# Patient Record
Sex: Female | Born: 1950 | Race: White | Hispanic: No | Marital: Married | Smoking: Never smoker
Health system: Southern US, Community
[De-identification: ages and names within clinical notes are randomized; demographics above are authoritative.]

## PROBLEM LIST (undated history)

## (undated) DIAGNOSIS — M6281 Muscle weakness (generalized): Secondary | ICD-10-CM

## (undated) DIAGNOSIS — Z940 Kidney transplant status: Secondary | ICD-10-CM

## (undated) DIAGNOSIS — R262 Difficulty in walking, not elsewhere classified: Secondary | ICD-10-CM

## (undated) DIAGNOSIS — R1312 Dysphagia, oropharyngeal phase: Secondary | ICD-10-CM

## (undated) DIAGNOSIS — N189 Chronic kidney disease, unspecified: Secondary | ICD-10-CM

## (undated) DIAGNOSIS — N390 Urinary tract infection, site not specified: Secondary | ICD-10-CM

---

## 2007-09-07 ENCOUNTER — Encounter: Admission: RE | Admit: 2007-09-07 | Discharge: 2007-09-07 | Payer: Self-pay | Admitting: Unknown Physician Specialty

## 2008-09-10 ENCOUNTER — Encounter: Admission: RE | Admit: 2008-09-10 | Discharge: 2008-09-10 | Payer: Self-pay | Admitting: Family Medicine

## 2009-09-16 ENCOUNTER — Encounter: Admission: RE | Admit: 2009-09-16 | Discharge: 2009-09-16 | Payer: Self-pay | Admitting: Internal Medicine

## 2010-09-23 ENCOUNTER — Encounter: Admission: RE | Admit: 2010-09-23 | Discharge: 2010-09-23 | Payer: Self-pay | Admitting: Family Medicine

## 2011-08-14 ENCOUNTER — Other Ambulatory Visit: Payer: Self-pay | Admitting: *Deleted

## 2011-08-14 DIAGNOSIS — Z1231 Encounter for screening mammogram for malignant neoplasm of breast: Secondary | ICD-10-CM

## 2011-09-29 ENCOUNTER — Inpatient Hospital Stay: Admission: RE | Admit: 2011-09-29 | Payer: Self-pay | Source: Ambulatory Visit

## 2011-10-20 ENCOUNTER — Ambulatory Visit
Admission: RE | Admit: 2011-10-20 | Discharge: 2011-10-20 | Disposition: A | Discharge status: Home / Self Care | Payer: Self-pay | Source: Ambulatory Visit | Attending: *Deleted | Admitting: *Deleted

## 2011-10-20 DIAGNOSIS — Z1231 Encounter for screening mammogram for malignant neoplasm of breast: Secondary | ICD-10-CM

## 2014-01-03 ENCOUNTER — Ambulatory Visit: Payer: Medicare Other

## 2014-01-03 ENCOUNTER — Ambulatory Visit (INDEPENDENT_AMBULATORY_CARE_PROVIDER_SITE_OTHER): Payer: BC Managed Care – PPO | Admitting: Physical Therapy

## 2014-01-03 DIAGNOSIS — M6281 Muscle weakness (generalized): Secondary | ICD-10-CM

## 2014-01-03 DIAGNOSIS — M25569 Pain in unspecified knee: Secondary | ICD-10-CM

## 2014-01-03 DIAGNOSIS — R269 Unspecified abnormalities of gait and mobility: Secondary | ICD-10-CM

## 2014-01-03 DIAGNOSIS — R609 Edema, unspecified: Secondary | ICD-10-CM

## 2014-01-08 ENCOUNTER — Ambulatory Visit: Payer: Medicare Other | Admitting: Physical Therapy

## 2014-01-08 ENCOUNTER — Encounter (INDEPENDENT_AMBULATORY_CARE_PROVIDER_SITE_OTHER): Payer: BC Managed Care – PPO | Admitting: Physical Therapy

## 2014-01-08 DIAGNOSIS — M6281 Muscle weakness (generalized): Secondary | ICD-10-CM

## 2014-01-08 DIAGNOSIS — R609 Edema, unspecified: Secondary | ICD-10-CM

## 2014-01-08 DIAGNOSIS — R269 Unspecified abnormalities of gait and mobility: Secondary | ICD-10-CM

## 2014-01-08 DIAGNOSIS — M25569 Pain in unspecified knee: Secondary | ICD-10-CM

## 2014-01-12 ENCOUNTER — Encounter (INDEPENDENT_AMBULATORY_CARE_PROVIDER_SITE_OTHER): Payer: BC Managed Care – PPO | Admitting: Physical Therapy

## 2014-01-12 DIAGNOSIS — R609 Edema, unspecified: Secondary | ICD-10-CM

## 2014-01-12 DIAGNOSIS — M25569 Pain in unspecified knee: Secondary | ICD-10-CM

## 2014-01-12 DIAGNOSIS — M6281 Muscle weakness (generalized): Secondary | ICD-10-CM

## 2014-01-12 DIAGNOSIS — R269 Unspecified abnormalities of gait and mobility: Secondary | ICD-10-CM

## 2014-01-15 ENCOUNTER — Encounter (INDEPENDENT_AMBULATORY_CARE_PROVIDER_SITE_OTHER): Payer: BC Managed Care – PPO | Admitting: Physical Therapy

## 2014-01-15 DIAGNOSIS — R269 Unspecified abnormalities of gait and mobility: Secondary | ICD-10-CM

## 2014-01-15 DIAGNOSIS — M25569 Pain in unspecified knee: Secondary | ICD-10-CM

## 2014-01-15 DIAGNOSIS — R609 Edema, unspecified: Secondary | ICD-10-CM

## 2014-01-15 DIAGNOSIS — M6281 Muscle weakness (generalized): Secondary | ICD-10-CM

## 2014-01-18 ENCOUNTER — Encounter (INDEPENDENT_AMBULATORY_CARE_PROVIDER_SITE_OTHER): Payer: BC Managed Care – PPO

## 2014-01-18 DIAGNOSIS — R269 Unspecified abnormalities of gait and mobility: Secondary | ICD-10-CM

## 2014-01-18 DIAGNOSIS — M25569 Pain in unspecified knee: Secondary | ICD-10-CM

## 2014-01-18 DIAGNOSIS — M6281 Muscle weakness (generalized): Secondary | ICD-10-CM

## 2014-01-18 DIAGNOSIS — R609 Edema, unspecified: Secondary | ICD-10-CM

## 2014-01-22 ENCOUNTER — Encounter (INDEPENDENT_AMBULATORY_CARE_PROVIDER_SITE_OTHER): Payer: BC Managed Care – PPO | Admitting: Physical Therapy

## 2014-01-22 DIAGNOSIS — M6281 Muscle weakness (generalized): Secondary | ICD-10-CM

## 2014-01-22 DIAGNOSIS — M25569 Pain in unspecified knee: Secondary | ICD-10-CM

## 2014-01-22 DIAGNOSIS — R609 Edema, unspecified: Secondary | ICD-10-CM

## 2014-01-22 DIAGNOSIS — R269 Unspecified abnormalities of gait and mobility: Secondary | ICD-10-CM

## 2014-01-25 ENCOUNTER — Encounter (INDEPENDENT_AMBULATORY_CARE_PROVIDER_SITE_OTHER): Payer: BC Managed Care – PPO

## 2014-01-25 DIAGNOSIS — M25569 Pain in unspecified knee: Secondary | ICD-10-CM

## 2014-01-25 DIAGNOSIS — M6281 Muscle weakness (generalized): Secondary | ICD-10-CM

## 2014-01-25 DIAGNOSIS — R609 Edema, unspecified: Secondary | ICD-10-CM

## 2014-01-25 DIAGNOSIS — R269 Unspecified abnormalities of gait and mobility: Secondary | ICD-10-CM

## 2017-11-16 DIAGNOSIS — E039 Hypothyroidism, unspecified: Secondary | ICD-10-CM

## 2017-11-16 DIAGNOSIS — F319 Bipolar disorder, unspecified: Secondary | ICD-10-CM

## 2017-11-16 DIAGNOSIS — F419 Anxiety disorder, unspecified: Secondary | ICD-10-CM

## 2017-11-16 DIAGNOSIS — A0472 Enterocolitis due to Clostridium difficile, not specified as recurrent: Secondary | ICD-10-CM

## 2017-11-16 HISTORY — DX: Hypothyroidism, unspecified: E03.9

## 2017-11-16 HISTORY — DX: Enterocolitis due to Clostridium difficile, not specified as recurrent: A04.72

## 2017-11-16 HISTORY — DX: Bipolar disorder, unspecified: F31.9

## 2017-11-16 HISTORY — DX: Anxiety disorder, unspecified: F41.9

## 2019-11-27 ENCOUNTER — Emergency Department (EMERGENCY_DEPARTMENT_HOSPITAL)
Admission: EM | Admit: 2019-11-27 | Discharge: 2019-11-28 | Disposition: A | Discharge status: Home / Self Care | Payer: Medicare Other | Source: Home / Self Care | Attending: Emergency Medicine | Admitting: Emergency Medicine

## 2019-11-27 ENCOUNTER — Encounter (HOSPITAL_COMMUNITY): Payer: Self-pay

## 2019-11-27 ENCOUNTER — Other Ambulatory Visit: Payer: Self-pay

## 2019-11-27 DIAGNOSIS — F319 Bipolar disorder, unspecified: Secondary | ICD-10-CM | POA: Diagnosis not present

## 2019-11-27 DIAGNOSIS — Z20822 Contact with and (suspected) exposure to covid-19: Secondary | ICD-10-CM | POA: Insufficient documentation

## 2019-11-27 DIAGNOSIS — N181 Chronic kidney disease, stage 1: Secondary | ICD-10-CM | POA: Insufficient documentation

## 2019-11-27 DIAGNOSIS — R4182 Altered mental status, unspecified: Secondary | ICD-10-CM

## 2019-11-27 DIAGNOSIS — Z8659 Personal history of other mental and behavioral disorders: Secondary | ICD-10-CM

## 2019-11-27 DIAGNOSIS — E039 Hypothyroidism, unspecified: Secondary | ICD-10-CM | POA: Insufficient documentation

## 2019-11-27 DIAGNOSIS — Z940 Kidney transplant status: Secondary | ICD-10-CM | POA: Insufficient documentation

## 2019-11-27 DIAGNOSIS — N390 Urinary tract infection, site not specified: Secondary | ICD-10-CM | POA: Diagnosis not present

## 2019-11-27 HISTORY — DX: Dysphagia, oropharyngeal phase: R13.12

## 2019-11-27 HISTORY — DX: Kidney transplant status: Z94.0

## 2019-11-27 HISTORY — DX: Chronic kidney disease, unspecified: N18.9

## 2019-11-27 HISTORY — DX: Muscle weakness (generalized): M62.81

## 2019-11-27 HISTORY — DX: Urinary tract infection, site not specified: N39.0

## 2019-11-27 HISTORY — DX: Difficulty in walking, not elsewhere classified: R26.2

## 2019-11-27 LAB — COMPREHENSIVE METABOLIC PANEL
ALT: 10 U/L (ref 0–44)
AST: 19 U/L (ref 15–41)
Albumin: 4 g/dL (ref 3.5–5.0)
Alkaline Phosphatase: 26 U/L — ABNORMAL LOW (ref 38–126)
Anion gap: 10 (ref 5–15)
BUN: 24 mg/dL — ABNORMAL HIGH (ref 8–23)
CO2: 22 mmol/L (ref 22–32)
Calcium: 9.7 mg/dL (ref 8.9–10.3)
Chloride: 109 mmol/L (ref 98–111)
Creatinine, Ser: 1.39 mg/dL — ABNORMAL HIGH (ref 0.44–1.00)
GFR calc Af Amer: 45 mL/min — ABNORMAL LOW (ref >60)
GFR calc non Af Amer: 39 mL/min — ABNORMAL LOW (ref >60)
Glucose, Bld: 103 mg/dL — ABNORMAL HIGH (ref 70–99)
Potassium: 3.9 mmol/L (ref 3.5–5.1)
Sodium: 141 mmol/L (ref 135–145)
Total Bilirubin: 0.6 mg/dL (ref 0.3–1.2)
Total Protein: 7 g/dL (ref 6.5–8.1)

## 2019-11-27 LAB — CBC
HCT: 42.2 % (ref 36.0–46.0)
Hemoglobin: 14 g/dL (ref 12.0–15.0)
MCH: 30.6 pg (ref 26.0–34.0)
MCHC: 33.2 g/dL (ref 30.0–36.0)
MCV: 92.1 fL (ref 80.0–100.0)
Platelets: 227 10*3/uL (ref 150–400)
RBC: 4.58 MIL/uL (ref 3.87–5.11)
RDW: 12.2 % (ref 11.5–15.5)
WBC: 7.6 10*3/uL (ref 4.0–10.5)
nRBC: 0 % (ref 0.0–0.2)

## 2019-11-27 LAB — URINALYSIS, ROUTINE W REFLEX MICROSCOPIC
Bilirubin Urine: NEGATIVE
Glucose, UA: NEGATIVE mg/dL
Hgb urine dipstick: NEGATIVE
Ketones, ur: 5 mg/dL — AB
Leukocytes,Ua: NEGATIVE
Nitrite: NEGATIVE
Protein, ur: NEGATIVE mg/dL
Specific Gravity, Urine: 1.015 (ref 1.005–1.030)
pH: 7 (ref 5.0–8.0)

## 2019-11-27 LAB — RESPIRATORY PANEL BY RT PCR (FLU A&B, COVID)
Influenza A by PCR: NEGATIVE
Influenza B by PCR: NEGATIVE
SARS Coronavirus 2 by RT PCR: NEGATIVE

## 2019-11-27 LAB — VALPROIC ACID LEVEL: Valproic Acid Lvl: 45 ug/mL — ABNORMAL LOW (ref 50.0–100.0)

## 2019-11-27 LAB — LACTIC ACID, PLASMA: Lactic Acid, Venous: 1.2 mmol/L (ref 0.5–1.9)

## 2019-11-27 NOTE — ED Provider Notes (Addendum)
[facility] DEPT Provider Note   CSN: [id number] Arrival date & time: 11/27/19  1603     History Chief Complaint  Patient presents with  . Altered Mental Status    Brenda Nolan is a 69 y.o. female.  Patient with hx bipolar, ckd s/p renal transplant 2011, presents via EMS from [facility] with general confusion more so than baseline. Also report is history of uti. Patient is very limited historian, ?altered ms/psych - level 5 caveat. Patient just states she 'needs more medicine for bipolar', but denies any other specific physical complaint or symptoms. No fever. No headache. No neck pain or stiffness. No chest pain or sob. No cough or uri symptoms. No abd pain or nvd. No dysuria or gu c/o. Pt unsure if any recent change in meds.   The history is provided by the patient and the EMS personnel. The history is limited by the condition of the patient.  Altered Mental Status Presenting symptoms: confusion   Associated symptoms: no abdominal pain, no fever, no headaches, no rash and no vomiting        Past Medical History:  Diagnosis Date  . Anxiety 2019  . Bipolar 1 disorder ([place]) 2019  . Chronic kidney disease (CKD)    Stage 1  . Difficulty in walking, not elsewhere classified   . Dysphagia, oropharyngeal phase   . Enterocolitis due to Clostridioides difficile 2019  . Hypothyroidism 2019  . Kidney transplant status   . Muscle weakness   . Urinary tract infection     There are no problems to display for this patient.   No past surgical history on file.   OB History   No obstetric history on file.     No family history on file.  Social History   Tobacco Use  . Smoking status: Not on file  Substance Use Topics  . Alcohol use: Not on file  . Drug use: Not on file    Home Medications Prior to Admission medications   Not on File    Allergies    Abilify [aripiprazole], Ibuprofen, Levaquin [levofloxacin], Sulfa antibiotics, and  Sulfamethoxazole  Review of Systems   Review of Systems  Constitutional: Negative for chills and fever.  HENT: Negative for sore throat.   Eyes: Negative for redness.  Respiratory: Negative for cough and shortness of breath.   Cardiovascular: Negative for chest pain.  Gastrointestinal: Negative for abdominal pain, diarrhea and vomiting.  Endocrine: Negative for polyuria.  Genitourinary: Negative for dysuria and flank pain.  Musculoskeletal: Negative for back pain and neck pain.  Skin: Negative for rash.  Neurological: Negative for headaches.  Hematological: Does not bruise/bleed easily.  Psychiatric/Behavioral: Positive for confusion.    Physical Exam Updated Vital Signs BP (!) 147/88   Pulse (!) 121   Temp 99.3 F (37.4 C) (Oral)   Resp (!) 28   SpO2 100%   Physical Exam Vitals and nursing note reviewed.  Constitutional:      Appearance: Normal appearance. She is well-developed.  HENT:     Head: Atraumatic.     Nose: Nose normal.     Mouth/Throat:     Mouth: Mucous membranes are moist.  Eyes:     General: No scleral icterus.    Conjunctiva/sclera: Conjunctivae normal.     Pupils: Pupils are equal, round, and reactive to light.  Neck:     Vascular: No carotid bruit.     Trachea: No tracheal deviation.     Comments: No stiffness  or rigidity Cardiovascular:     Rate and Rhythm: Normal rate and regular rhythm.     Pulses: Normal pulses.     Heart sounds: Normal heart sounds. No murmur. No friction rub. No gallop.   Pulmonary:     Effort: Pulmonary effort is normal. No respiratory distress.     Breath sounds: Normal breath sounds.  Abdominal:     General: Bowel sounds are normal. There is no distension.     Palpations: Abdomen is soft.     Tenderness: There is no abdominal tenderness. There is no guarding.  Genitourinary:    Comments: No cva tenderness.  Musculoskeletal:        General: No swelling or tenderness.     Cervical back: Normal range of motion and  neck supple. No rigidity. No muscular tenderness.     Right lower leg: No edema.     Left lower leg: No edema.  Skin:    General: Skin is warm and dry.     Findings: No rash.  Neurological:     Mental Status: She is alert.     Comments: Alert, speech pressured. Moves bil extremities purposefully with good strength. sens grossly intact bil.  Psychiatric:     Comments: Anxious appearing.      ED Results / Procedures / Treatments   Labs (all labs ordered are listed, but only abnormal results are displayed) Results for orders placed or performed during the hospital encounter of 11/27/19  Respiratory Panel by RT PCR (Flu A&B, Covid) - Nasopharyngeal Swab   Specimen: Nasopharyngeal Swab  Result Value Ref Range   SARS Coronavirus 2 by RT PCR NEGATIVE NEGATIVE   Influenza A by PCR NEGATIVE NEGATIVE   Influenza B by PCR NEGATIVE NEGATIVE  CBC  Result Value Ref Range   WBC 7.6 4.0 - 10.5 K/uL   RBC 4.58 3.87 - 5.11 MIL/uL   Hemoglobin 14.0 12.0 - 15.0 g/dL   HCT 42.2 36.0 - 46.0 %   MCV 92.1 80.0 - 100.0 fL   MCH 30.6 26.0 - 34.0 pg   MCHC 33.2 30.0 - 36.0 g/dL   RDW 12.2 11.5 - 15.5 %   Platelets 227 150 - 400 K/uL   nRBC 0.0 0.0 - 0.2 %  CMET  Result Value Ref Range   Sodium 141 135 - 145 mmol/L   Potassium 3.9 3.5 - 5.1 mmol/L   Chloride 109 98 - 111 mmol/L   CO2 22 22 - 32 mmol/L   Glucose, Bld 103 (H) 70 - 99 mg/dL   BUN 24 (H) 8 - 23 mg/dL   Creatinine, Ser 1.39 (H) 0.44 - 1.00 mg/dL   Calcium 9.7 8.9 - 10.3 mg/dL   Total Protein 7.0 6.5 - 8.1 g/dL   Albumin 4.0 3.5 - 5.0 g/dL   AST 19 15 - 41 U/L   ALT 10 0 - 44 U/L   Alkaline Phosphatase 26 (L) 38 - 126 U/L   Total Bilirubin 0.6 0.3 - 1.2 mg/dL   GFR calc non Af Amer 39 (L) >60 mL/min   GFR calc Af Amer 45 (L) >60 mL/min   Anion gap 10 5 - 15  UA  Result Value Ref Range   Color, Urine YELLOW YELLOW   APPearance CLOUDY (A) CLEAR   Specific Gravity, Urine 1.015 1.005 - 1.030   pH 7.0 5.0 - 8.0   Glucose, UA  NEGATIVE NEGATIVE mg/dL   Hgb urine dipstick NEGATIVE NEGATIVE   Bilirubin Urine NEGATIVE  NEGATIVE   Ketones, ur 5 (A) NEGATIVE mg/dL   Protein, ur NEGATIVE NEGATIVE mg/dL   Nitrite NEGATIVE NEGATIVE   Leukocytes,Ua NEGATIVE NEGATIVE  Lactic acid  Result Value Ref Range   Lactic Acid, Venous 1.2 0.5 - 1.9 mmol/L  Valproic acid level  Result Value Ref Range   Valproic Acid Lvl 45 (L) 50.0 - 100.0 ug/mL     EKG None  Radiology CT HEAD WO CONTRAST  Result Date: 11/28/2019 CLINICAL DATA:  Altered mental status EXAM: CT HEAD WITHOUT CONTRAST TECHNIQUE: Contiguous axial images were obtained from the base of the skull through the vertex without intravenous contrast. COMPARISON:  None. FINDINGS: Brain: No evidence of acute territorial infarction, hemorrhage, hydrocephalus,extra-axial collection or mass lesion/mass effect. There is dilatation the ventricles and sulci consistent with age-related atrophy. Low-attenuation changes in the deep white matter consistent with small vessel ischemia. Vascular: No hyperdense vessel or unexpected calcification. Skull: The skull is intact. No fracture or focal lesion identified. Sinuses/Orbits: The visualized paranasal sinuses and mastoid air cells are clear. The orbits and globes intact. Other: None IMPRESSION: No acute intracranial abnormality. Findings consistent with age related atrophy and chronic small vessel ischemia Electronically Signed   By: Prudencio Pair M.D.   On: 11/28/2019 00:29    Procedures Procedures (including critical care time)  Medications Ordered in ED Medications - No data to display  ED Course  I have reviewed the triage vital signs and the nursing notes.  Pertinent labs & imaging results that were available during my care of the patient were reviewed by me and considered in my medical decision making (see chart for details).    MDM Rules/Calculators/A&P                     Iv ns. Continuous pulse ox and monitor. Stat labs.    Reviewed nursing notes and prior charts for additional history.   Labs reviewed/interpreted by me - chem normal. ua neg for infection. Lactate normal.  Patient appears anxious, at times agitated, almost manic, requests 'bipolar' evaluation. [facility] team consulted.   Recheck pt, calmer, alert, content appearing, await BH eval.   CT reviewed/interpreted by me - no acute hem.   [place] team has assessed and indicates recommends overnight observation and they will reassess in AM.         Final Clinical Impression(s) / ED Diagnoses Final diagnoses:  None    Rx / DC Orders ED Discharge Orders    None          Lajean Saver, MD 11/28/19 [phone]

## 2019-11-27 NOTE — BH Assessment (Signed)
Tele Assessment Note   Patient Name: Brenda Nolan MRN: [id number] Referring Physician: Lajean Saver, MD Location of Patient: [facility] Location of Provider: [facility] Department  [name] is an 69 y.o. female. Per EDP report, "Patient with hx bipolar, ckd s/p renal transplant 2011, presents via EMS from [facility] with general confusion more so than baseline. Also report is history of uti. Patient is very limited historian, ?altered ms/psych - level 5 caveat. Patient just states she 'needs more medicine for bipolar', but denies any other specific physical complaint or symptoms. No fever. No headache. No neck pain or stiffness. No chest pain or sob. No cough or uri symptoms. No abd pain or nvd. No dysuria or gu c/o. Pt unsure if any recent change in meds.  The history is provided by the patient and the EMS personnel. The history is limited by the condition of the patient.   TTS:  Pt presents to [facility] via EMS from assisted living facility for altered mental status. TTS attempted to ask pt questions, pt presented very confused with an altered mental status and kept repeating the phrase, "I'm supposed to go in triage". Pt unable to complete assessment and answer more questions. Pt was asked for collateral information, stated she has a daughter but could not say her name or phone number. Pt presented with partial incoherent speech and shaking physically.    Collateral:  TTS called pts assisted living facility [facility] located in [place] at [phone]. TTS spoke with Med Technician Kandis. She states that pt has displayed altered and confused behavior since falling in the shower at the facility last week. She states after fall patient began acting abnormal, confused about how to put her clothing on and her shoes. She states that pt does not have a history of SI attempts or self injurious behaviors while at the home. She also reports pt has a history of Bipolar disorder and  currently takes Depakote, Klonopin as well as other medications and that she is med compliant. She states that pt sleep has been good and her appetite is good as well.  Diagnosis: Altered Mental Status, Bipolar 1 Disorder  Past Medical History:  Past Medical History:  Diagnosis Date  . Anxiety 2019  . Bipolar 1 disorder ([place]) 2019  . Chronic kidney disease (CKD)    Stage 1  . Difficulty in walking, not elsewhere classified   . Dysphagia, oropharyngeal phase   . Enterocolitis due to Clostridioides difficile 2019  . Hypothyroidism 2019  . Kidney transplant status   . Muscle weakness   . Urinary tract infection     No past surgical history on file.  Family History: No family history on file.  Social History:  has no history on file for tobacco, alcohol, and drug.  Additional Social History:  Alcohol / Drug Use Pain Medications: SEE MAR Prescriptions: SEE MAR Over the Counter: SEE MAR History of alcohol / drug use?: No history of alcohol / drug abuse  CIWA: CIWA-Ar BP: (!) 160/92 Pulse Rate: 70 COWS:    Allergies:  Allergies  Allergen Reactions  . Atorvastatin Other (See Comments)    Myalgias Myalgias   . Ketoconazole Rash  . Abilify [Aripiprazole]   . Ibuprofen   . Levaquin [Levofloxacin]   . Sulfa Antibiotics   . Sulfamethoxazole     Home Medications: (Not in a hospital admission)   OB/GYN Status:  No LMP recorded.  General Assessment Data Assessment unable to be completed: Yes Reason for  not completing assessment: PT NOT ROOMED YET Is this a Tele or Face-to-Face Assessment?: Tele Assessment Is this an Initial Assessment or a Re-assessment for this encounter?: Initial Assessment Patient Accompanied by:: N/A Language Other than English: No Living Arrangements: In Assisted Living/Nursing Home (Comment: Name of Nursing Home What gender do you identify as?: Female Pregnancy Status: No Living Arrangements: Other (Comment) Can pt return to current living  arrangement?: Yes Admission Status: Voluntary Is patient capable of signing voluntary admission?: Yes Referral Source: Other Insurance type: none     Crisis Care Plan Living Arrangements: Other (Comment) Legal Guardian: (unknown) Name of Psychiatrist: (unknown, but pt has one) Name of Therapist: none  Education Status Is patient currently in school?: No  Risk to self with the past 6 months Suicidal Ideation: (UTA) Has patient been a risk to self within the past 6 months prior to admission? : (UTA) Suicidal Intent: (UTA) Has patient had any suicidal intent within the past 6 months prior to admission? : (UTA) Is patient at risk for suicide?: (UTA) Suicidal Plan?: (UTA) Has patient had any suicidal plan within the past 6 months prior to admission? : (UTA) Access to Means: (UTA) What has been your use of drugs/alcohol within the last 12 months?: (UTA) Previous Attempts/Gestures: (UTA) How many times?: (UTA) Other Self Harm Risks: (UTA) Triggers for Past Attempts: Unknown Intentional Self Injurious Behavior: (UTA) Family Suicide History: Unable to assess Recent stressful life event(s): (UTA) Persecutory voices/beliefs?: (UTA) Depression: (UTA) Depression Symptoms: (UTA) Substance abuse history and/or treatment for substance abuse?: (UTA) Suicide prevention information given to non-admitted patients: Not applicable  Risk to Others within the past 6 months Homicidal Ideation: (UTA) Does patient have any lifetime risk of violence toward others beyond the six months prior to admission? : Unknown Thoughts of Harm to Others: (UTA) Current Homicidal Intent: (UTA) Current Homicidal Plan: (UTA) Access to Homicidal Means: (UTA) Identified Victim: (UTA) History of harm to others?: (UTA) Assessment of Violence: (UTA) Violent Behavior Description: (UTA) Does patient have access to weapons?: (UTA) Criminal Charges Pending?: (UTA) Does patient have a court date: (UTA) Is patient on  probation?: (UTA)  Psychosis Hallucinations: (UTA) Delusions: (UTA)  Mental Status Report Appearance/Hygiene: Unremarkable Eye Contact: Unable to Assess Motor Activity: Unable to assess Speech: Unable to assess Level of Consciousness: Alert Mood: (UTA) Affect: Unable to Assess Anxiety Level: (UTA) Thought Processes: Unable to Assess Judgement: Unable to Assess Obsessive Compulsive Thoughts/Behaviors: Unable to Assess  Cognitive Functioning Concentration: Unable to Assess Memory: Unable to Assess Is patient IDD: (UTA) Insight: Unable to Assess Impulse Control: Unable to Assess Appetite: (UTA) Have you had any weight changes? : (UTA) Sleep: Unable to Assess Vegetative Symptoms: Unable to Assess  ADLScreening [facility] Assessment Services) Patient's cognitive ability adequate to safely complete daily activities?: (UTA) Patient able to express need for assistance with ADLs?: (UTA) Independently performs ADLs?: (UTA)  Prior Inpatient Therapy Prior Inpatient Therapy: (UTA)  Prior Outpatient Therapy Prior Outpatient Therapy: (UTA)  ADL Screening (condition at time of admission) Patient's cognitive ability adequate to safely complete daily activities?: (UTA) Patient able to express need for assistance with ADLs?: (UTA) Independently performs ADLs?: (UTA)             Advance Directives (For Healthcare) Does Patient Have a Medical Advance Directive?: Yes Type of Advance Directive: Healthcare Power of Attorney          Disposition: Lindon Romp, FNP, recommends overnight observation, reassess in the am. Need more collateral information for pt. TTS confirmed status  with current provider. Disposition Initial Assessment Completed for this Encounter: Yes  This service was provided via telemedicine using a 2-way, interactive audio and video technology.  Names of all persons participating in this telemedicine service and their role in this encounter. Name: Kaylena Petsche  Role: Patient  Name: Antony Contras Role: TTS  Name:  Role:   Name: Role:     Donato Heinz 11/27/2019 11:20 PM

## 2019-11-27 NOTE — ED Triage Notes (Signed)
Pt BIB [facility] from [facility] [address] - per EMS this is a COVID free facility - facility noticed increased AMS, pt appears more confused than baseline. Per facility, pt has frequent UTI after Kidney transplant. Pt denies pain with urination, states, her urine is darker than normal.

## 2019-11-27 NOTE — ED Notes (Signed)
Pt had a fall last week causing bruising around right eye, pt was evaluated at facility.

## 2019-11-27 NOTE — ED Notes (Signed)
Pt moved to TCU for TTS consult.

## 2019-11-27 NOTE — BHH Counselor (Signed)
TTS will see pt when room, per her nurse , she is trying to find her a room to complete assessment now. Thanks - clock stopped

## 2019-11-28 ENCOUNTER — Other Ambulatory Visit (HOSPITAL_COMMUNITY): Payer: Medicare Other

## 2019-11-28 ENCOUNTER — Other Ambulatory Visit: Payer: Self-pay

## 2019-11-28 ENCOUNTER — Encounter (HOSPITAL_COMMUNITY): Payer: Self-pay | Admitting: Registered Nurse

## 2019-11-28 ENCOUNTER — Emergency Department (HOSPITAL_COMMUNITY): Payer: Medicare Other

## 2019-11-28 DIAGNOSIS — F319 Bipolar disorder, unspecified: Secondary | ICD-10-CM

## 2019-11-28 MED ORDER — PERPHENAZINE 4 MG PO TABS
8.0000 mg | ORAL_TABLET | Freq: Every day | ORAL | Status: DC
  Filled 2019-11-28: qty 2

## 2019-11-28 MED ORDER — CYCLOSPORINE 25 MG PO CAPS
150.0000 mg | ORAL_CAPSULE | Freq: Every day | ORAL | Status: DC
  Filled 2019-11-28: qty 2

## 2019-11-28 MED ORDER — MEGESTROL ACETATE 20 MG PO TABS
20.0000 mg | ORAL_TABLET | Freq: Every day | ORAL | Status: DC
  Administered 2019-11-28: 12:00:00 20 mg via ORAL
  Filled 2019-11-28 (×2): qty 1

## 2019-11-28 MED ORDER — AMLODIPINE BESYLATE 5 MG PO TABS
5.0000 mg | ORAL_TABLET | Freq: Every day | ORAL | Status: DC
  Administered 2019-11-28: 5 mg via ORAL
  Filled 2019-11-28: qty 1

## 2019-11-28 MED ORDER — METOPROLOL TARTRATE 25 MG PO TABS
100.0000 mg | ORAL_TABLET | Freq: Two times a day (BID) | ORAL | Status: DC
  Administered 2019-11-28 (×2): 100 mg via ORAL
  Filled 2019-11-28 (×2): qty 4

## 2019-11-28 MED ORDER — DIVALPROEX SODIUM ER 250 MG PO TB24
250.0000 mg | ORAL_TABLET | Freq: Every day | ORAL | Status: DC
  Administered 2019-11-28: 250 mg via ORAL
  Filled 2019-11-28: qty 1

## 2019-11-28 MED ORDER — MEGESTROL ACETATE 40 MG/ML PO SUSP
20.0000 mg | Freq: Every day | ORAL | Status: DC
  Filled 2019-11-28: qty 5

## 2019-11-28 MED ORDER — MAGNESIUM 400 MG PO TABS: 1.0000 | ORAL_TABLET | Freq: Every day | ORAL | Status: DC

## 2019-11-28 MED ORDER — DOXAZOSIN MESYLATE 1 MG PO TABS
1.0000 mg | ORAL_TABLET | Freq: Every day | ORAL | Status: DC
  Filled 2019-11-28: qty 1

## 2019-11-28 MED ORDER — CLONAZEPAM 1 MG PO TABS
1.0000 mg | ORAL_TABLET | Freq: Every day | ORAL | Status: DC
  Administered 2019-11-28: 1 mg via ORAL
  Filled 2019-11-28: qty 2

## 2019-11-28 MED ORDER — MAGNESIUM OXIDE 400 (241.3 MG) MG PO TABS
400.0000 mg | ORAL_TABLET | Freq: Every day | ORAL | Status: DC
  Administered 2019-11-28: 11:00:00 400 mg via ORAL
  Filled 2019-11-28: qty 1

## 2019-11-28 MED ORDER — MYCOPHENOLATE MOFETIL 250 MG PO CAPS
250.0000 mg | ORAL_CAPSULE | Freq: Two times a day (BID) | ORAL | Status: DC
  Administered 2019-11-28: 12:00:00 250 mg via ORAL
  Filled 2019-11-28 (×3): qty 1

## 2019-11-28 MED ORDER — ADULT MULTIVITAMIN W/MINERALS CH
1.0000 | ORAL_TABLET | Freq: Every day | ORAL | Status: DC
  Administered 2019-11-28: 11:00:00 1 via ORAL
  Filled 2019-11-28: qty 1

## 2019-11-28 MED ORDER — LEVOTHYROXINE SODIUM 50 MCG PO TABS
50.0000 ug | ORAL_TABLET | Freq: Every day | ORAL | Status: DC
  Administered 2019-11-28: 07:00:00 50 ug via ORAL
  Filled 2019-11-28: qty 1

## 2019-11-28 MED ORDER — FENOFIBRATE 160 MG PO TABS
160.0000 mg | ORAL_TABLET | Freq: Every day | ORAL | Status: DC
  Administered 2019-11-28: 12:00:00 160 mg via ORAL
  Filled 2019-11-28 (×2): qty 1

## 2019-11-28 MED ORDER — CYCLOSPORINE 25 MG PO CAPS
125.0000 mg | ORAL_CAPSULE | Freq: Every day | ORAL | Status: DC
  Administered 2019-11-28: 125 mg via ORAL
  Filled 2019-11-28 (×3): qty 1

## 2019-11-28 NOTE — ED Notes (Signed)
DCd off unit to facility per provider. Pt alert, calm, cooperative, no s/s of distress. Pt transported by [facility].

## 2019-11-28 NOTE — Progress Notes (Signed)
CSW spoke with [place] at [facility] to inform her that patient had completed her CT scan and evaluated by psych and was cleared on both. [place] reports patient can be transported back via [place]. [place] also asked if CSW could call patient's son to provide him with an update. CSW spoke with patient's son Remo Lipps and let him know patient was being discharged back to [facility].   [place], LCSW Transitions of Care Department [facility] ED [phone]

## 2019-11-28 NOTE — Progress Notes (Signed)
Received Murna from the main ED, she was changed into scrubs and oriented to her new environment.

## 2019-11-28 NOTE — Discharge Instructions (Addendum)
For your behavioral health needs, you are advised to continue treatment with your current outpatient provider. °

## 2019-11-28 NOTE — ED Notes (Signed)
Pt chart reopened to access relative phone number to belongings pickup.

## 2019-11-28 NOTE — BH Assessment (Signed)
[facility] Assessment Progress Note  Per Shuvon Rankin FNP, this pt does not require psychiatric hospitalization at this time.  Pt is to be discharged from [facility] with recommendation to continue treatment with her unspecified outpatient provider.  This has been included in pt's discharge instructions.  A social work consult will be ordered to facilitate pt's return to her residential facility.  Pt's nurse, Eustaquio Maize, has been notified.  Jalene Mullet, [place] Triage Specialist [phone]

## 2019-11-28 NOTE — Consult Note (Signed)
[facility] ED Discharge  11/28/2019 11:39 AM Ora Spittler  MRN:  [id number] Principal Problem: Bipolar 1 disorder [facility]) Discharge Diagnoses: Principal Problem:   Bipolar 1 disorder [facility])   Subjective: Brenda Nolan, 69 y.o., female patient seen via tele psych by this provider, Dr. Dwyane Dee; and chart reviewed on 11/28/19.  On evaluation Masen Windell states that she came to the hospital because of her bursitis and that she is having pain in her legs.  Patient was able to respond with simple questions that were not too lengthy.  When asked if her sleep was good or bad patient responded "bad" when asked why patient responded "hard to fall asleep."  Patient asked if her appetite was good or bad and she responded "good."  Patient denied that she was hearing or seeing things that others could not, patient's stated she did not want to kill herself or hurt or kill anyone else.  Is aware that she is in the hospital but not which hospital.  Patient reported that she had to have a walker to assist with ambulation.  Patient also reported that she needed assistance with getting dressed and bathing.  Patient appears to have some tardive dyskinesia related to the smacking of lips and protrusion of tongue on and off. During evaluation Bibian Musich is alert/oriented x 3; calm/cooperative; and mood is congruent with affect.  She does not appear to be responding to internal/external stimuli or delusional thoughts.  Patient denies suicidal/self-harm/homicidal ideation, psychosis, and paranoia.  Patient answered question appropriately to the best of her ability.  Referral to social work to assist with patient getting back to nursing facility   Total Time spent with patient: 30 minutes  Past Psychiatric History: Bipolar disorder  Past Medical History:  Past Medical History:  Diagnosis Date  . Anxiety 2019  . Bipolar 1 disorder ([place]) 2019  . Chronic kidney disease (CKD)    Stage 1  . Difficulty in walking, not elsewhere  classified   . Dysphagia, oropharyngeal phase   . Enterocolitis due to Clostridioides difficile 2019  . Hypothyroidism 2019  . Kidney transplant status   . Muscle weakness   . Urinary tract infection    History reviewed. No pertinent surgical history. Family History: History reviewed. No pertinent family history. Family Psychiatric  History: Unaware Social History:  Social History   Substance and Sexual Activity  Alcohol Use None     Social History   Substance and Sexual Activity  Drug Use Not on file    Social History   Socioeconomic History  . Marital status: Married    Spouse name: Not on file  . Number of children: Not on file  . Years of education: Not on file  . Highest education level: Not on file  Occupational History  . Not on file  Tobacco Use  . Smoking status: Not on file  Substance and Sexual Activity  . Alcohol use: Not on file  . Drug use: Not on file  . Sexual activity: Not on file  Other Topics Concern  . Not on file  Social History Narrative  . Not on file   Social Determinants of Health   Financial Resource Strain:   . Difficulty of Paying Living Expenses: Not on file  Food Insecurity:   . Worried About Charity fundraiser in the Last Year: Not on file  . Ran Out of Food in the Last Year: Not on file  Transportation Needs:   . Lack of Transportation (Medical): Not  on file  . Lack of Transportation (Non-Medical): Not on file  Physical Activity:   . Days of Exercise per Week: Not on file  . Minutes of Exercise per Session: Not on file  Stress:   . Feeling of Stress : Not on file  Social Connections:   . Frequency of Communication with Friends and Family: Not on file  . Frequency of Social Gatherings with Friends and Family: Not on file  . Attends Religious Services: Not on file  . Active Member of Clubs or Organizations: Not on file  . Attends Archivist Meetings: Not on file  . Marital Status: Not on file    Has this  patient used any form of tobacco in the last 30 days? (Cigarettes, Smokeless Tobacco, Cigars, and/or Pipes) Prescription not provided because: Patient does not use tobacco products  Current Medications: Current Facility-Administered Medications  Medication Dose Route Frequency Provider Last Rate Last Admin  . amLODipine (NORVASC) tablet 5 mg  5 mg Oral Daily Lajean Saver, MD   5 mg at 11/28/19 1055  . clonazePAM (KLONOPIN) tablet 1 mg  1 mg Oral QHS Lajean Saver, MD   1 mg at 11/28/19 0028  . cycloSPORINE (SANDIMMUNE) capsule 125 mg  125 mg Oral Daily Lajean Saver, MD      . cycloSPORINE (SANDIMMUNE) capsule 150 mg  150 mg Oral QHS Lajean Saver, MD      . divalproex (DEPAKOTE ER) 24 hr tablet 250 mg  250 mg Oral QHS Lajean Saver, MD   250 mg at 11/28/19 0028  . doxazosin (CARDURA) tablet 1 mg  1 mg Oral QHS Lajean Saver, MD      . fenofibrate tablet 160 mg  160 mg Oral Daily Lajean Saver, MD      . levothyroxine (SYNTHROID) tablet 50 mcg  50 mcg Oral Q0600 Lajean Saver, MD   50 mcg at 11/28/19 [phone]  . magnesium oxide (MAG-OX) tablet 400 mg  400 mg Oral Daily Lajean Saver, MD   400 mg at 11/28/19 1055  . megestrol (MEGACE) tablet 20 mg  20 mg Oral Daily Lajean Saver, MD      . metoprolol tartrate (LOPRESSOR) tablet 100 mg  100 mg Oral BID Lajean Saver, MD   100 mg at 11/28/19 1054  . multivitamin with minerals tablet 1 tablet  1 tablet Oral Daily Lajean Saver, MD   1 tablet at 11/28/19 1055  . mycophenolate (CELLCEPT) capsule 250 mg  250 mg Oral BID Lajean Saver, MD      . perphenazine (TRILAFON) tablet 8 mg  8 mg Oral QHS Lajean Saver, MD       Current Outpatient Medications  Medication Sig Dispense Refill  . amLODipine (NORVASC) 5 MG tablet Take 5 mg by mouth daily.    . clonazePAM (KLONOPIN) 1 MG tablet Take 1 mg by mouth at bedtime.    . cycloSPORINE (SANDIMMUNE) 25 MG capsule Take 125 mg by mouth daily.    . cycloSPORINE (SANDIMMUNE) 25 MG capsule Take 150 mg by mouth at  bedtime.    . divalproex (DEPAKOTE ER) 250 MG 24 hr tablet Take 250 mg by mouth at bedtime.    Marland Kitchen doxazosin (CARDURA) 1 MG tablet Take 1 mg by mouth at bedtime.    . fenofibrate 160 MG tablet Take 160 mg by mouth daily.    . Iron-FA-B Cmp-C-Biot-Probiotic (FUSION PLUS PO) Take 1 capsule by mouth daily.    Marland Kitchen levothyroxine (SYNTHROID) 50 MCG tablet Take 50 mcg  by mouth daily before breakfast.    . Magnesium 400 MG TABS Take 1 tablet by mouth daily.    . megestrol (MEGACE) 40 MG/ML suspension Take 20 mg by mouth daily.    . metoprolol tartrate (LOPRESSOR) 100 MG tablet Take 100 mg by mouth 2 (two) times daily.    . Multiple Vitamin (MULTIVITAMIN) tablet Take 1 tablet by mouth daily.    . mycophenolate (CELLCEPT) 250 MG capsule Take 250 mg by mouth 2 (two) times daily.    Marland Kitchen perphenazine (TRILAFON) 4 MG tablet Take 8 mg by mouth at bedtime.     PTA Medications: (Not in a hospital admission)   Musculoskeletal: Strength & Muscle Tone: Patient sitting up in bed unable to determine muscle strength Gait & Station: Patient reported needing a walker to assist with ambulation.  Did not see patient ambulate Patient leans: N/A  Psychiatric Specialty Exam: Physical Exam Vitals and nursing note reviewed.  Pulmonary:     Effort: Pulmonary effort is normal.  Neurological:     Mental Status: She is alert.  Psychiatric:        Attention and Perception: Attention normal. She does not perceive auditory or visual hallucinations.        Mood and Affect: Mood is anxious.        Speech: Speech is delayed ( Related to patient taking her time to answer the question or not answering the question if it was to wordY).        Behavior: Behavior is slowed. Behavior is cooperative.        Thought Content: Thought content is not paranoid or delusional. Thought content does not include homicidal or suicidal ideation.     Review of Systems  Musculoskeletal: Positive for arthralgias.  Psychiatric/Behavioral:  Positive for confusion ( Patient denied confusion but will get confused with questioning at times). Agitation:  Denies. Hallucinations:  Denies. Self-injury:  Denies. Sleep disturbance:  Hard to fall asleep. Suicidal ideas:  Denies. The patient is nervous/anxious.   All other systems reviewed and are negative.   Blood pressure 127/79, pulse 91, temperature 97.9 F (36.6 C), temperature source Oral, resp. rate 17, SpO2 99 %.There is no height or weight on file to calculate BMI.  General Appearance: Casual  Eye Contact:  Good  Speech:  Clear and Coherent and Slow  Volume:  Normal  Mood:  Anxious  Affect:  Congruent  Thought Process:  Coherent, Goal Directed and Linear  Orientation:  Full (Time, Place, and Person)  Thought Content:  WDL  Suicidal Thoughts:  No  Homicidal Thoughts:  No  Memory:  Immediate;   Poor Recent;   Poor  Judgement:  Fair  Insight:  Fair  Psychomotor Activity:  Unable to determine  Concentration:  Concentration: Poor and Attention Span: Poor  Recall:  Poor  Fund of Knowledge:  Fair  Language:  Fair  Akathisia:  No  Handed:  Right  AIMS (if indicated):     Assets:  Chief Executive Officer Social Support  ADL's:  Impaired  Cognition:  Impaired,  Mild  Sleep:        Demographic Factors:  Caucasian  Loss Factors: NA  Historical Factors: NA  Risk Reduction Factors:   Religious beliefs about death, Living with another person, especially a relative and Positive social support  Continued Clinical Symptoms:  Previous Psychiatric Diagnoses and Treatments  Cognitive Features That Contribute To Risk:  Loss of executive function    Suicide Risk:  Minimal: No identifiable suicidal  ideation.  Patients presenting with no risk factors but with morbid ruminations; may be classified as minimal risk based on the severity of the depressive symptoms    Plan Of Care/Follow-up recommendations:  Activity:  As tolerated Diet:  Heart  healthy    Discharge Instructions     For your behavioral health needs, you are advised to continue treatment with your current outpatient provider.    Disposition: Patient psychiatrically cleared No evidence of imminent risk to self or others at present.   Patient does not meet criteria for psychiatric inpatient admission. Supportive therapy provided about ongoing stressors. Discussed crisis plan, support from social network, calling 911, coming to the Emergency Department, and calling Suicide Hotline.  Ragnar Waas, NP 11/28/2019, 11:39 AM

## 2019-11-28 NOTE — ED Notes (Signed)
Water and snacks provided for pt.

## 2019-11-29 ENCOUNTER — Inpatient Hospital Stay (HOSPITAL_COMMUNITY)
Admission: EM | Admit: 2019-11-29 | Discharge: 2019-12-06 | DRG: 689 | Disposition: A | Discharge status: Skilled Nursing Facility | Payer: Medicare Other | Attending: Family Medicine | Admitting: Family Medicine

## 2019-11-29 ENCOUNTER — Emergency Department (HOSPITAL_COMMUNITY): Payer: Medicare Other

## 2019-11-29 DIAGNOSIS — F319 Bipolar disorder, unspecified: Secondary | ICD-10-CM | POA: Diagnosis present

## 2019-11-29 DIAGNOSIS — G92 Toxic encephalopathy: Secondary | ICD-10-CM

## 2019-11-29 DIAGNOSIS — E039 Hypothyroidism, unspecified: Secondary | ICD-10-CM | POA: Diagnosis present

## 2019-11-29 DIAGNOSIS — Z87440 Personal history of urinary (tract) infections: Secondary | ICD-10-CM

## 2019-11-29 DIAGNOSIS — G928 Other toxic encephalopathy: Secondary | ICD-10-CM

## 2019-11-29 DIAGNOSIS — I129 Hypertensive chronic kidney disease with stage 1 through stage 4 chronic kidney disease, or unspecified chronic kidney disease: Secondary | ICD-10-CM | POA: Diagnosis present

## 2019-11-29 DIAGNOSIS — N390 Urinary tract infection, site not specified: Principal | ICD-10-CM | POA: Diagnosis present

## 2019-11-29 DIAGNOSIS — R4182 Altered mental status, unspecified: Secondary | ICD-10-CM | POA: Diagnosis present

## 2019-11-29 DIAGNOSIS — N3000 Acute cystitis without hematuria: Secondary | ICD-10-CM

## 2019-11-29 DIAGNOSIS — N1832 Chronic kidney disease, stage 3b: Secondary | ICD-10-CM | POA: Diagnosis present

## 2019-11-29 DIAGNOSIS — R410 Disorientation, unspecified: Secondary | ICD-10-CM

## 2019-11-29 DIAGNOSIS — G9341 Metabolic encephalopathy: Secondary | ICD-10-CM | POA: Diagnosis present

## 2019-11-29 DIAGNOSIS — Z940 Kidney transplant status: Secondary | ICD-10-CM

## 2019-11-29 DIAGNOSIS — I10 Essential (primary) hypertension: Secondary | ICD-10-CM | POA: Diagnosis present

## 2019-11-29 DIAGNOSIS — Z79899 Other long term (current) drug therapy: Secondary | ICD-10-CM

## 2019-11-29 DIAGNOSIS — Z20822 Contact with and (suspected) exposure to covid-19: Secondary | ICD-10-CM | POA: Diagnosis present

## 2019-11-29 DIAGNOSIS — Z8619 Personal history of other infectious and parasitic diseases: Secondary | ICD-10-CM

## 2019-11-29 DIAGNOSIS — G2401 Drug induced subacute dyskinesia: Secondary | ICD-10-CM | POA: Diagnosis present

## 2019-11-29 DIAGNOSIS — D84821 Immunodeficiency due to drugs: Secondary | ICD-10-CM | POA: Diagnosis present

## 2019-11-29 DIAGNOSIS — R0902 Hypoxemia: Secondary | ICD-10-CM | POA: Diagnosis present

## 2019-11-29 LAB — PROTIME-INR
INR: 1.1 (ref 0.8–1.2)
Prothrombin Time: 13.8 seconds (ref 11.4–15.2)

## 2019-11-29 LAB — CBC WITH DIFFERENTIAL/PLATELET
Abs Immature Granulocytes: 0.06 10*3/uL (ref 0.00–0.07)
Basophils Absolute: 0 10*3/uL (ref 0.0–0.1)
Basophils Relative: 0 %
Eosinophils Absolute: 0.1 10*3/uL (ref 0.0–0.5)
Eosinophils Relative: 1 %
HCT: 41.4 % (ref 36.0–46.0)
Hemoglobin: 14 g/dL (ref 12.0–15.0)
Immature Granulocytes: 1 %
Lymphocytes Relative: 22 %
Lymphs Abs: 2.8 10*3/uL (ref 0.7–4.0)
MCH: 30.8 pg (ref 26.0–34.0)
MCHC: 33.8 g/dL (ref 30.0–36.0)
MCV: 91 fL (ref 80.0–100.0)
Monocytes Absolute: 0.7 10*3/uL (ref 0.1–1.0)
Monocytes Relative: 6 %
Neutro Abs: 9.2 10*3/uL — ABNORMAL HIGH (ref 1.7–7.7)
Neutrophils Relative %: 70 %
Platelets: 220 10*3/uL (ref 150–400)
RBC: 4.55 MIL/uL (ref 3.87–5.11)
RDW: 12.2 % (ref 11.5–15.5)
WBC: 12.8 10*3/uL — ABNORMAL HIGH (ref 4.0–10.5)
nRBC: 0 % (ref 0.0–0.2)

## 2019-11-29 LAB — URINALYSIS, COMPLETE (UACMP) WITH MICROSCOPIC
Bilirubin Urine: NEGATIVE
Glucose, UA: NEGATIVE mg/dL
Ketones, ur: 5 mg/dL — AB
Leukocytes,Ua: NEGATIVE
Nitrite: NEGATIVE
Protein, ur: NEGATIVE mg/dL
Specific Gravity, Urine: 1.015 (ref 1.005–1.030)
pH: 6 (ref 5.0–8.0)

## 2019-11-29 LAB — PROCALCITONIN: Procalcitonin: 1.07 ng/mL

## 2019-11-29 LAB — LACTIC ACID, PLASMA: Lactic Acid, Venous: 1.2 mmol/L (ref 0.5–1.9)

## 2019-11-29 LAB — RAPID URINE DRUG SCREEN, HOSP PERFORMED
Amphetamines: NOT DETECTED
Barbiturates: NOT DETECTED
Benzodiazepines: NOT DETECTED
Cocaine: NOT DETECTED
Opiates: NOT DETECTED
Tetrahydrocannabinol: NOT DETECTED

## 2019-11-29 LAB — COMPREHENSIVE METABOLIC PANEL
ALT: 11 U/L (ref 0–44)
AST: 19 U/L (ref 15–41)
Albumin: 3.7 g/dL (ref 3.5–5.0)
Alkaline Phosphatase: 31 U/L — ABNORMAL LOW (ref 38–126)
Anion gap: 14 (ref 5–15)
BUN: 23 mg/dL (ref 8–23)
CO2: 18 mmol/L — ABNORMAL LOW (ref 22–32)
Calcium: 10.1 mg/dL (ref 8.9–10.3)
Chloride: 104 mmol/L (ref 98–111)
Creatinine, Ser: 1.52 mg/dL — ABNORMAL HIGH (ref 0.44–1.00)
GFR calc Af Amer: 41 mL/min — ABNORMAL LOW (ref >60)
GFR calc non Af Amer: 35 mL/min — ABNORMAL LOW (ref >60)
Glucose, Bld: 91 mg/dL (ref 70–99)
Potassium: 3.9 mmol/L (ref 3.5–5.1)
Sodium: 136 mmol/L (ref 135–145)
Total Bilirubin: 1.3 mg/dL — ABNORMAL HIGH (ref 0.3–1.2)
Total Protein: 7.1 g/dL (ref 6.5–8.1)

## 2019-11-29 LAB — RESPIRATORY PANEL BY RT PCR (FLU A&B, COVID)
Influenza A by PCR: NEGATIVE
Influenza B by PCR: NEGATIVE
SARS Coronavirus 2 by RT PCR: NEGATIVE

## 2019-11-29 LAB — AMMONIA: Ammonia: 18 umol/L (ref 9–35)

## 2019-11-29 LAB — CBG MONITORING, ED: Glucose-Capillary: 77 mg/dL (ref 70–99)

## 2019-11-29 MED ORDER — MAGNESIUM OXIDE 400 (241.3 MG) MG PO TABS
400.0000 mg | ORAL_TABLET | Freq: Every day | ORAL | Status: DC
  Administered 2019-11-30 – 2019-12-06 (×7): 400 mg via ORAL
  Filled 2019-11-29 (×7): qty 1

## 2019-11-29 MED ORDER — DIVALPROEX SODIUM ER 500 MG PO TB24
500.0000 mg | ORAL_TABLET | Freq: Every day | ORAL | Status: DC
  Administered 2019-11-29 – 2019-12-05 (×7): 500 mg via ORAL
  Filled 2019-11-29 (×7): qty 1

## 2019-11-29 MED ORDER — PERPHENAZINE 2 MG PO TABS
8.0000 mg | ORAL_TABLET | Freq: Every day | ORAL | Status: DC
  Administered 2019-11-29 – 2019-12-03 (×5): 8 mg via ORAL
  Filled 2019-11-29 (×3): qty 4
  Filled 2019-11-29: qty 1
  Filled 2019-11-29: qty 4

## 2019-11-29 MED ORDER — MEGESTROL ACETATE 40 MG/ML PO SUSP
20.0000 mg | Freq: Every day | ORAL | Status: DC
  Filled 2019-11-29: qty 5

## 2019-11-29 MED ORDER — MAGNESIUM 400 MG PO TABS: 1.0000 | ORAL_TABLET | Freq: Every day | ORAL | Status: DC

## 2019-11-29 MED ORDER — MYCOPHENOLATE MOFETIL 250 MG PO CAPS
250.0000 mg | ORAL_CAPSULE | Freq: Two times a day (BID) | ORAL | Status: DC
  Administered 2019-11-29 – 2019-12-06 (×14): 250 mg via ORAL
  Filled 2019-11-29 (×15): qty 1

## 2019-11-29 MED ORDER — ACETAMINOPHEN 650 MG RE SUPP: 650.0000 mg | Freq: Four times a day (QID) | RECTAL | Status: DC | PRN

## 2019-11-29 MED ORDER — SODIUM CHLORIDE 0.9 % IV SOLN
1.0000 g | Freq: Once | INTRAVENOUS | Status: AC
  Administered 2019-11-29: 1 g via INTRAVENOUS
  Filled 2019-11-29: qty 10

## 2019-11-29 MED ORDER — CYCLOSPORINE 25 MG PO CAPS
150.0000 mg | ORAL_CAPSULE | Freq: Every day | ORAL | Status: DC
  Administered 2019-11-29 – 2019-12-05 (×7): 150 mg via ORAL
  Filled 2019-11-29 (×9): qty 2

## 2019-11-29 MED ORDER — SODIUM CHLORIDE 0.45 % IV SOLN: INTRAVENOUS | Status: DC

## 2019-11-29 MED ORDER — AMLODIPINE BESYLATE 5 MG PO TABS
5.0000 mg | ORAL_TABLET | Freq: Every day | ORAL | Status: DC
  Administered 2019-11-30 – 2019-12-06 (×7): 5 mg via ORAL
  Filled 2019-11-29 (×7): qty 1

## 2019-11-29 MED ORDER — CLONAZEPAM 1 MG PO TABS
1.0000 mg | ORAL_TABLET | Freq: Every day | ORAL | Status: DC | PRN
  Administered 2019-11-30 – 2019-12-04 (×3): 1 mg via ORAL
  Filled 2019-11-29 (×4): qty 1

## 2019-11-29 MED ORDER — CYCLOSPORINE 25 MG PO CAPS
125.0000 mg | ORAL_CAPSULE | Freq: Every day | ORAL | Status: DC
  Administered 2019-11-30 – 2019-12-06 (×7): 125 mg via ORAL
  Filled 2019-11-29 (×7): qty 1

## 2019-11-29 MED ORDER — FENOFIBRATE 160 MG PO TABS
160.0000 mg | ORAL_TABLET | Freq: Every day | ORAL | Status: DC
  Administered 2019-11-30 – 2019-12-06 (×7): 160 mg via ORAL
  Filled 2019-11-29 (×7): qty 1

## 2019-11-29 MED ORDER — METOPROLOL TARTRATE 50 MG PO TABS
100.0000 mg | ORAL_TABLET | Freq: Two times a day (BID) | ORAL | Status: DC
  Administered 2019-11-29 – 2019-12-06 (×14): 100 mg via ORAL
  Filled 2019-11-29 (×14): qty 2

## 2019-11-29 MED ORDER — THIAMINE HCL 100 MG/ML IJ SOLN
100.0000 mg | Freq: Once | INTRAMUSCULAR | Status: AC
  Administered 2019-11-29: 100 mg via INTRAVENOUS
  Filled 2019-11-29: qty 2

## 2019-11-29 MED ORDER — DOXAZOSIN MESYLATE 1 MG PO TABS
1.0000 mg | ORAL_TABLET | Freq: Every day | ORAL | Status: DC
  Administered 2019-11-29 – 2019-12-05 (×7): 1 mg via ORAL
  Filled 2019-11-29 (×8): qty 1

## 2019-11-29 MED ORDER — LEVOTHYROXINE SODIUM 50 MCG PO TABS
50.0000 ug | ORAL_TABLET | Freq: Every day | ORAL | Status: DC
  Administered 2019-11-30 – 2019-12-06 (×7): 50 ug via ORAL
  Filled 2019-11-29 (×7): qty 1

## 2019-11-29 MED ORDER — ACETAMINOPHEN 325 MG PO TABS: 650.0000 mg | ORAL_TABLET | Freq: Four times a day (QID) | ORAL | Status: DC | PRN

## 2019-11-29 MED ORDER — ONDANSETRON HCL 4 MG/2ML IJ SOLN: 4.0000 mg | Freq: Four times a day (QID) | INTRAMUSCULAR | Status: DC | PRN

## 2019-11-29 MED ORDER — ONDANSETRON HCL 4 MG PO TABS: 4.0000 mg | ORAL_TABLET | Freq: Four times a day (QID) | ORAL | Status: DC | PRN

## 2019-11-29 NOTE — ED Provider Notes (Signed)
[facility] DEPART[facility] Provider Note   CSN: [id number] Arrival date & time: 11/29/19  1551     History No chief complaint on file.   Brenda Nolan is a 69 y.o. female scented from [facility] facility for weakness and worsening mental status.  She was seen 2 days ago for the same complaint.  EMS reports that the patient slid out of her wheelchair today and was let down to the floor by the staff who are attending her.  She had no real injuries today.  The patient was unable to provide much history.  She does have a history of psychiatric disorder, chronic kidney disease status post transplant in 2011.  She is chronically immunocompromised on CellCept and cyclosporine.  The patient had a work-up that included a CT head yesterday that was negative for acute abnormality.Labs done show a low Depakote level just below the normal range, a negative Covid test, lactic acid was within normal limits, urinalysis was without infection, creatinine and BUN were just mildly elevated.  She had no elevated white blood cell count 2 days ago.  Patient arrives to the ER very tachypneic, flushed skin and hot to the touch.  HPI     Past Medical History:  Diagnosis Date  . Anxiety 2019  . Bipolar 1 disorder ([place]) 2019  . Chronic kidney disease (CKD)    Stage 1  . Difficulty in walking, not elsewhere classified   . Dysphagia, oropharyngeal phase   . Enterocolitis due to Clostridioides difficile 2019  . Hypothyroidism 2019  . Kidney transplant status   . Muscle weakness   . Urinary tract infection     Patient Active Problem List   Diagnosis Date Noted  . Altered mental status 11/29/2019  . Kidney transplant status   . Stage 3b chronic kidney disease   . Essential hypertension   . Bipolar 1 disorder ([place]) 2019  . Hypothyroidism 2019    No past surgical history on file.   OB History   No obstetric history on file.     No family history on file.  Social  History   Tobacco Use  . Smoking status: Not on file  Substance Use Topics  . Alcohol use: Not on file  . Drug use: Not on file    Home Medications Prior to Admission medications   Medication Sig Start Date End Date Taking? Authorizing Provider  amLODipine (NORVASC) 5 MG tablet Take 5 mg by mouth daily.   Yes [provider]  clonazePAM (KLONOPIN) 1 MG tablet Take 1 mg by mouth daily as needed (For nervousness).    Yes [provider]  cycloSPORINE (SANDIMMUNE) 25 MG capsule Take 125 mg by mouth daily.   Yes [provider]  cycloSPORINE (SANDIMMUNE) 25 MG capsule Take 150 mg by mouth at bedtime.   Yes [provider]  divalproex (DEPAKOTE ER) 500 MG 24 hr tablet Take 500 mg by mouth at bedtime.   Yes [provider]  doxazosin (CARDURA) 1 MG tablet Take 1 mg by mouth at bedtime.   Yes [provider]  fenofibrate 160 MG tablet Take 160 mg by mouth daily.   Yes [provider]  levothyroxine (SYNTHROID) 50 MCG tablet Take 50 mcg by mouth daily before breakfast.   Yes [provider]  Magnesium 400 MG TABS Take 1 tablet by mouth daily.   Yes [provider]  megestrol (MEGACE) 40 MG/ML suspension Take 20 mg by mouth daily.  Yes [provider]  metoprolol tartrate (LOPRESSOR) 100 MG tablet Take 100 mg by mouth 2 (two) times daily.   Yes [provider]  Multiple Vitamin (MULTIVITAMIN) tablet Take 1 tablet by mouth daily.   Yes [provider]  mycophenolate (CELLCEPT) 250 MG capsule Take 250 mg by mouth 2 (two) times daily.   Yes [provider]  perphenazine (TRILAFON) 4 MG tablet Take 8 mg by mouth at bedtime.   Yes [provider]  Iron-FA-B Cmp-C-Biot-Probiotic (FUSION PLUS PO) Take 1 capsule by mouth daily.    [provider]    Allergies    Atorvastatin, Ketoconazole, Abilify [aripiprazole], Ibuprofen, Levaquin [levofloxacin], Sulfa antibiotics,  and Sulfamethoxazole  Review of Systems   Review of Systems Unable to review his symptoms secondary to altered mental status Physical Exam Updated Vital Signs BP 130/77 (BP Location: Right Arm)   Pulse 86   Temp 98.6 F (37 C) (Oral)   Resp (!) 25   LMP  (LMP Unknown)   SpO2 100%   Physical Exam Vitals and nursing note reviewed.  Constitutional:      General: She is not in acute distress.    Appearance: She is well-developed. She is ill-appearing and toxic-appearing. She is not diaphoretic.  HENT:     Head: Normocephalic and atraumatic.  Eyes:     General: No scleral icterus.    Extraocular Movements: Extraocular movements intact.     Conjunctiva/sclera: Conjunctivae normal.     Pupils: Pupils are equal, round, and reactive to light.  Cardiovascular:     Rate and Rhythm: Normal rate and regular rhythm.     Heart sounds: Normal heart sounds. No murmur. No friction rub. No gallop.   Pulmonary:     Effort: Respiratory distress present.     Breath sounds: Normal breath sounds.     Comments: Tachypneic Abdominal:     General: Bowel sounds are normal. There is no distension.     Palpations: Abdomen is soft. There is no mass.     Tenderness: There is no abdominal tenderness. There is no guarding.  Musculoskeletal:     Cervical back: Normal range of motion.  Skin:    Comments: Skin is flushed, hot to the touch  Neurological:     Mental Status: She is alert.     GCS: GCS eye subscore is 4. GCS verbal subscore is 4. GCS motor subscore is 6.  Psychiatric:        Behavior: Behavior normal.     ED Results / Procedures / Treatments   Labs (all labs ordered are listed, but only abnormal results are displayed) Labs Reviewed  COMPREHENSIVE METABOLIC PANEL - Abnormal; Notable for the following components:      Result Value   CO2 18 (*)    Creatinine, Ser 1.52 (*)    Alkaline Phosphatase 31 (*)    Total Bilirubin 1.3 (*)    GFR calc non Af Amer 35 (*)    GFR calc Af Amer 41  (*)    All other components within normal limits  CBC WITH DIFFERENTIAL/PLATELET - Abnormal; Notable for the following components:   WBC 12.8 (*)    Neutro Abs 9.2 (*)    All other components within normal limits  URINALYSIS, COMPLETE (UACMP) WITH MICROSCOPIC - Abnormal; Notable for the following components:   Color, Urine AMBER (*)    APPearance HAZY (*)    Hgb urine dipstick SMALL (*)    Ketones, ur 5 (*)  Bacteria, UA RARE (*)    All other components within normal limits  RESPIRATORY PANEL BY RT PCR (FLU A&B, COVID)  CULTURE, BLOOD (ROUTINE X 2)  CULTURE, BLOOD (ROUTINE X 2)  URINE CULTURE  AMMONIA  LACTIC ACID, PLASMA  RAPID URINE DRUG SCREEN, HOSP PERFORMED  PROTIME-INR  PROCALCITONIN  HIV ANTIBODY (ROUTINE TESTING W REFLEX)  CBC WITH DIFFERENTIAL/PLATELET  COMPREHENSIVE METABOLIC PANEL  CBG MONITORING, ED    EKG EKG Interpretation  Date/Time:  Wednesday November 29 2019 16:20:43 EST Ventricular Rate:  85 PR Interval:    QRS Duration: 86 QT Interval:  386 QTC Calculation: 459 R Axis:   50 Text Interpretation: Sinus rhythm Consider left atrial enlargement No STEMI Confirmed by Nanda Quinton [phone]) on 11/29/2019 5:17:00 PM   Radiology CT HEAD WO CONTRAST  Result Date: 11/28/2019 CLINICAL DATA:  Altered mental status EXAM: CT HEAD WITHOUT CONTRAST TECHNIQUE: Contiguous axial images were obtained from the base of the skull through the vertex without intravenous contrast. COMPARISON:  None. FINDINGS: Brain: No evidence of acute territorial infarction, hemorrhage, hydrocephalus,extra-axial collection or mass lesion/mass effect. There is dilatation the ventricles and sulci consistent with age-related atrophy. Low-attenuation changes in the deep white matter consistent with small vessel ischemia. Vascular: No hyperdense vessel or unexpected calcification. Skull: The skull is intact. No fracture or focal lesion identified. Sinuses/Orbits: The visualized paranasal sinuses and  mastoid air cells are clear. The orbits and globes intact. Other: None IMPRESSION: No acute intracranial abnormality. Findings consistent with age related atrophy and chronic small vessel ischemia Electronically Signed   By: Prudencio Pair M.D.   On: 11/28/2019 00:29   DG Chest Port 1 View  Result Date: 11/29/2019 CLINICAL DATA:  [place] 1 week ago, decline in function and mental status since EXAM: PORTABLE CHEST 1 VIEW COMPARISON:  Portable exam 1620 hours compared to 01/31/2018 at FINDINGS: Normal heart size, mediastinal contours, and pulmonary vascularity. Lungs clear. No pulmonary infiltrate, pleural effusion or pneumothorax. Bones demineralized. IMPRESSION: No acute abnormalities. Electronically Signed   By: Lavonia Dana M.D.   On: 11/29/2019 16:33    Procedures Procedures (including critical care time)  Medications Ordered in ED Medications  0.45 % sodium chloride infusion ( Intravenous New Bag/Given 11/29/19 2135)  acetaminophen (TYLENOL) tablet 650 mg (has no administration in time range)    Or  acetaminophen (TYLENOL) suppository 650 mg (has no administration in time range)  ondansetron (ZOFRAN) tablet 4 mg (has no administration in time range)    Or  ondansetron (ZOFRAN) injection 4 mg (has no administration in time range)  amLODipine (NORVASC) tablet 5 mg (has no administration in time range)  clonazePAM (KLONOPIN) tablet 1 mg (has no administration in time range)  cycloSPORINE (SANDIMMUNE) capsule 125 mg (has no administration in time range)  cycloSPORINE (SANDIMMUNE) capsule 150 mg (150 mg Oral Given 11/29/19 2152)  divalproex (DEPAKOTE ER) 24 hr tablet 500 mg (500 mg Oral Given 11/29/19 2200)  doxazosin (CARDURA) tablet 1 mg (1 mg Oral Given 11/29/19 2152)  fenofibrate tablet 160 mg (has no administration in time range)  levothyroxine (SYNTHROID) tablet 50 mcg (has no administration in time range)  megestrol (MEGACE) 40 MG/ML suspension 20 mg (has no administration in time range)    metoprolol tartrate (LOPRESSOR) tablet 100 mg (100 mg Oral Given 11/29/19 2200)  mycophenolate (CELLCEPT) capsule 250 mg (250 mg Oral Given 11/29/19 2153)  perphenazine (TRILAFON) tablet 8 mg (has no administration in time range)  magnesium oxide (MAG-OX) tablet 400 mg (has no  administration in time range)  thiamine (B-1) injection 100 mg (100 mg Intravenous Given 11/29/19 1645)  cefTRIAXone (ROCEPHIN) 1 g in sodium chloride 0.9 % 100 mL IVPB (0 g Intravenous Stopped 11/29/19 1959)    ED Course  I have reviewed the triage vital signs and the nursing notes.  Pertinent labs & imaging results that were available during my care of the patient were reviewed by me and considered in my medical decision making (see chart for details).  Clinical Course as of Nov 28 2252  Wed Nov 29, 2019  1752 I spoke with the patient's son Marisa Sprinkles.  He is the healthcare power of attorney.  He states that the patient has had several episodes that are similar to what she is presenting with in the past.  He says that in his previous episodes she has had what he called "weird, fast breathing".  She has had previous urinary tract infections making her weak, altered.  She is also had drug interactions from the antibiotics of her treatment for UTI causing her to have some issues with her psych medications.   [AH]    Clinical Course User Index [AH] Margarita Mail, PA-C   MDM Rules/Calculators/A&P                      CC: Altered mental status and weakness VS:  Vitals:   11/29/19 1815 11/29/19 1845 11/29/19 1900 11/29/19 2030  BP: (!) 144/82 139/79 (!) 148/80 130/77  Pulse: 81 79 77 86  Resp: (!) 22 (!) 29 (!) 26 (!) 25  Temp:    98.6 F (37 C)  TempSrc:    Oral  SpO2: 100% 100% 100% 100%    [id number] is gathered by EMR, EMS and son via telephone. DDX:The differential diagnosis of weakness includes but is not limited to neurologic causes (GBS, myasthenia gravis, CVA, MS, ALS, transverse myelitis, spinal  cord injury, CVA, botulism, ) and other causes: ACS, Arrhythmia, syncope, orthostatic hypotension, sepsis, hypoglycemia, electrolyte disturbance, hypothyroidism, respiratory failure, symptomatic anemia, dehydration, heat injury, polypharmacy, malignancy.The differential diagnosis for AMS is extensive and includes, but is not limited to: drug overdose - opioids, alcohol, sedatives, antipsychotics, drug withdrawal, others; Metabolic: hypoxia, hypoglycemia, hyperglycemia, hypercalcemia, hypernatremia, hyponatremia, uremia, hepatic encephalopathy, hypothyroidism, hyperthyroidism, vitamin B12 or thiamine deficiency, carbon monoxide poisoning, Wilson's disease, Lactic acidosis, DKA/HHOS; Infectious: meningitis, encephalitis, bacteremia/sepsis, urinary tract infection, pneumonia, neurosyphilis; Structural: Space-occupying lesion, (brain tumor, subdural hematoma, hydrocephalus,); Vascular: stroke, subarachnoid hemorrhage, coronary ischemia, hypertensive encephalopathy, CNS vasculitis, thrombotic thrombocytopenic purpura, disseminated intravascular coagulation, hyperviscosity; Psychiatric: Schizophrenia, depression; Other: Seizure, hypothermia, heat stroke, ICU psychosis, dementia -"sundowning."  Labs: I reviewed the labs which show UDS negative.  Lactic acid within normal limits, ammonia level normal.  Covid test is pending.  CMP shows creatinine at baseline.  Patient's CBC does show an elevation in her white blood cell count with left shift which is new from 2 days ago.  Cath urine does appear to be at least potentially infected given that urine was clear 2 days ago and now has some bacteria and red blood cells.  I have treated the patient here with Rocephin.  I have updated the patient's son and healthcare power of attorney. Imaging: I personally reviewed the images (1 view chest x-ray) which show(s) no acute abnormalities on my interpretation EKG: Sinus rhythm at a rate of 85 MDM:.  Patient with weakness and altered  mental status.  Likely metabolic encephalopathy.  I have discussed the case with Dr. Olevia Bowens  who will admit the patient to the hospitalist service.  Patient is stable throughout her ED visit. Patient disposition: Admit Patient condition: Improved. The patient appears reasonably screened and/or stabilized for discharge and I doubt any other medical condition or other [facility] requiring further screening, evaluation, or treatment in the ED at this time prior to discharge. I have discussed lab and/or imaging findings with the patient and answered all questions/concerns to the best of my ability. I have discussed return precautions and OP follow up.     Final Clinical Impression(s) / ED Diagnoses Final diagnoses:  Toxic metabolic encephalopathy  Acute cystitis without hematuria    Rx / DC Orders ED Discharge Orders    None       Margarita Mail, PA-C 11/29/19 2307    Margette Fast, MD 11/30/19 1245
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
## 2019-11-29 NOTE — H&P (Signed)
History and Physical    Brenda Nolan DOB: 03/17/1951 DOA: 11/29/2019  PCP: Patient, No Pcp Per   Patient coming from: Home.  I have personally briefly reviewed patient's old medical records in [place]  Chief Complaint: Altered mental status.  HPI: Brenda Nolan is a 69 y.o. female with medical history significant of anxiety, bipolar 1 disorders, stage IIIb chronic kidney disease, unspecified difficulty walking history, oropharyngeal phase dysphagia, history of C. difficile colitis, hypothyroidism, muscle weakness, kidney transplant status on immunosuppressants, history of UTI episodes associated with AMS who is brought to the emergency department due to decreasing mentation in the past 3 days.  Facility reported a fall with head trauma about a week ago.  She was brought to the ER on (11/27/2019) Monday evening and had a CT scan that was negative.  She was seen by behavioral health who recommended an overnight observation in the ED and she was later discharged yesterday morning.  However, she returns today after sliding out of her wheelchair.  She is oriented to name, she knows she is in the hospital, but disoriented to date and recent events.  She is unable to provide further history.  ED Course: Urinalysis was hazy in appearance, with small hemoglobinuria ketonuria 5 mg/dL.  She had 21-50 RBC and 0-5 WBC per hpf with rare bacteria.  UDS was negative.  Her CBC showed a white count was 12.8 with 70% neutrophils, hemoglobin 14.0 g/dL and platelets 220.  PT was 13.8 and INR 1.1.  Ammonia was 18 mol/L.  Lactic acid was normal.  PCR for SARS 2, influenza a and influenza B were negative.  Procalcitonin was 1.07 ng/mL.    Imaging: Her chest radiograph did not have any acute abnormality.  Recent CT of the head done on 11/28/2019 did not have any acute on normalities.  Review of Systems: As per HPI otherwise 10 point review of systems negative.   Past Medical History:  Diagnosis  Date  . Anxiety 2019  . Bipolar 1 disorder ([place]) 2019  . Chronic kidney disease (CKD)    Stage 1  . Difficulty in walking, not elsewhere classified   . Dysphagia, oropharyngeal phase   . Enterocolitis due to Clostridioides difficile 2019  . Hypothyroidism 2019  . Kidney transplant status   . Muscle weakness   . Urinary tract infection     No past surgical history on file.   has no history on file for tobacco, alcohol, and drug.  Allergies  Allergen Reactions  . Atorvastatin Other (See Comments)    Myalgias Myalgias   . Ketoconazole Rash  . Abilify [Aripiprazole]   . Ibuprofen   . Levaquin [Levofloxacin]   . Sulfa Antibiotics   . Sulfamethoxazole     No family history on file. Unable to obtain family history at this time.  Prior to Admission medications   Medication Sig Start Date End Date Taking? Authorizing Provider  amLODipine (NORVASC) 5 MG tablet Take 5 mg by mouth daily.   Yes [provider]  clonazePAM (KLONOPIN) 1 MG tablet Take 1 mg by mouth daily as needed (For nervousness).    Yes [provider]  cycloSPORINE (SANDIMMUNE) 25 MG capsule Take 125 mg by mouth daily.   Yes [provider]  cycloSPORINE (SANDIMMUNE) 25 MG capsule Take 150 mg by mouth at bedtime.   Yes [provider]  doxazosin (CARDURA) 1 MG tablet Take 1 mg by mouth at bedtime.   Yes [provider]  fenofibrate  160 MG tablet Take 160 mg by mouth daily.   Yes [provider]  levothyroxine (SYNTHROID) 50 MCG tablet Take 50 mcg by mouth daily before breakfast.   Yes [provider]  Magnesium 400 MG TABS Take 1 tablet by mouth daily.   Yes [provider]  megestrol (MEGACE) 40 MG/ML suspension Take 20 mg by mouth daily.   Yes [provider]  metoprolol tartrate (LOPRESSOR) 100 MG tablet Take 100 mg by mouth 2 (two) times daily.   Yes [provider]  Multiple Vitamin (MULTIVITAMIN) tablet Take 1 tablet by  mouth daily.   Yes [provider]  mycophenolate (CELLCEPT) 250 MG capsule Take 250 mg by mouth 2 (two) times daily.   Yes [provider]  perphenazine (TRILAFON) 4 MG tablet Take 8 mg by mouth at bedtime.   Yes [provider]  Iron-FA-B Cmp-C-Biot-Probiotic (FUSION PLUS PO) Take 1 capsule by mouth daily.    [provider]    Physical Exam: Vitals:   11/29/19 1702 11/29/19 1815 11/29/19 1845 11/29/19 1900  BP:  (!) 144/82 139/79 (!) 148/80  Pulse:  81 79 77  Resp:  (!) 22 (!) 29 (!) 26  Temp: 99 F (37.2 C)     TempSrc: Rectal     SpO2:  100% 100% 100%    Constitutional: NAD, calm, comfortable Eyes: PERRL, lids and conjunctivae normal ENMT: Mucous membranes are dry. Posterior pharynx clear of any exudate or lesions. Neck: normal, supple, no masses, no thyromegaly Respiratory: clear to auscultation bilaterally, no wheezing, no crackles. Normal respiratory effort. No accessory muscle use.  Cardiovascular: Regular rate and rhythm, no murmurs / rubs / gallops. No extremity edema. 2+ pedal pulses. No carotid bruits.  Abdomen: Soft, mild suprapubic tenderness, no masses palpated. No hepatosplenomegaly. Bowel sounds positive.  Musculoskeletal: no clubbing / cyanosis. Good ROM, no contractures. Normal muscle tone.  Skin: no rashes, lesions, ulcers on limited dermatological exam. Neurologic: Basal tremors.  CN 2-12 grossly intact. Sensation intact, DTR normal. Strength 5/5 in all 4.  Psychiatric: Oriented to name and place, disoriented to time and situation  Labs on Admission: I have personally reviewed following labs and imaging studies  CBC: Recent Labs  Lab 11/27/19 1654 11/29/19 1640  WBC 7.6 12.8*  NEUTROABS  --  9.2*  HGB 14.0 14.0  HCT 42.2 41.4  MCV 92.1 91.0  PLT 227 [id number]   Basic Metabolic Panel: Recent Labs  Lab 11/27/19 1654 11/29/19 1640  NA 141 136  K 3.9 3.9  CL 109 104  CO2 22 18*  GLUCOSE 103* 91  BUN 24* 23   CREATININE 1.39* 1.52*  CALCIUM 9.7 10.1   GFR: CrCl cannot be calculated (Unknown ideal weight.). Liver Function Tests: Recent Labs  Lab 11/27/19 1654 11/29/19 1640  AST 19 19  ALT 10 11  ALKPHOS 26* 31*  BILITOT 0.6 1.3*  PROT 7.0 7.1  ALBUMIN 4.0 3.7   No results for input(s): LIPASE, AMYLASE in the last 168 hours. Recent Labs  Lab 11/29/19 1640  AMMONIA 18   Coagulation Profile: Recent Labs  Lab 11/29/19 1852  INR 1.1   Cardiac Enzymes: No results for input(s): CKTOTAL, CKMB, CKMBINDEX, TROPONINI in the last 168 hours. BNP (last 3 results) No results for input(s): PROBNP in the last 8760 hours. HbA1C: No results for input(s): HGBA1C in the last 72 hours. CBG: Recent Labs  Lab 11/29/19 1621  GLUCAP 77   Lipid Profile: No results for input(s): CHOL,  HDL, LDLCALC, TRIG, CHOLHDL, LDLDIRECT in the last 72 hours. Thyroid Function Tests: No results for input(s): TSH, T4TOTAL, FREET4, T3FREE, THYROIDAB in the last 72 hours. Anemia Panel: No results for input(s): VITAMINB12, FOLATE, FERRITIN, TIBC, IRON, RETICCTPCT in the last 72 hours. Urine analysis:    Component Value Date/Time   COLORURINE AMBER (A) 11/29/2019 1702   APPEARANCEUR HAZY (A) 11/29/2019 1702   LABSPEC 1.015 11/29/2019 1702   PHURINE 6.0 11/29/2019 1702   GLUCOSEU NEGATIVE 11/29/2019 1702   HGBUR SMALL (A) 11/29/2019 1702   BILIRUBINUR NEGATIVE 11/29/2019 1702   KETONESUR 5 (A) 11/29/2019 1702   PROTEINUR NEGATIVE 11/29/2019 1702   NITRITE NEGATIVE 11/29/2019 1702   LEUKOCYTESUR NEGATIVE 11/29/2019 1702    Radiological Exams on Admission: CT HEAD WO CONTRAST  Result Date: 11/28/2019 CLINICAL DATA:  Altered mental status EXAM: CT HEAD WITHOUT CONTRAST TECHNIQUE: Contiguous axial images were obtained from the base of the skull through the vertex without intravenous contrast. COMPARISON:  None. FINDINGS: Brain: No evidence of acute territorial infarction, hemorrhage, hydrocephalus,extra-axial  collection or mass lesion/mass effect. There is dilatation the ventricles and sulci consistent with age-related atrophy. Low-attenuation changes in the deep white matter consistent with small vessel ischemia. Vascular: No hyperdense vessel or unexpected calcification. Skull: The skull is intact. No fracture or focal lesion identified. Sinuses/Orbits: The visualized paranasal sinuses and mastoid air cells are clear. The orbits and globes intact. Other: None IMPRESSION: No acute intracranial abnormality. Findings consistent with age related atrophy and chronic small vessel ischemia Electronically Signed   By: Prudencio Pair M.D.   On: 11/28/2019 00:29   DG Chest Port 1 View  Result Date: 11/29/2019 CLINICAL DATA:  [place] 1 week ago, decline in function and mental status since EXAM: PORTABLE CHEST 1 VIEW COMPARISON:  Portable exam 1620 hours compared to 01/31/2018 at FINDINGS: Normal heart size, mediastinal contours, and pulmonary vascularity. Lungs clear. No pulmonary infiltrate, pleural effusion or pneumothorax. Bones demineralized. IMPRESSION: No acute abnormalities. Electronically Signed   By: Lavonia Dana M.D.   On: 11/29/2019 16:33    EKG: Independently reviewed.  Vent. rate 85 BPM PR interval * ms QRS duration 86 ms QT/QTc 386/459 ms P-R-T axes 76 50 66 Sinus rhythm Consider left atrial enlargement  Assessment/Plan Principal Problem:   Altered mental status Observation/telemetry. Continue IV hydration. No obvious source of infection. Continue IV antibiotic due to immunosuppression.  Active Problems:   Bipolar 1 disorder (HCC) Continue Depakote ER 500 mg at bedtime.. Continue clonazepam as needed.    Hypothyroidism Continue levothyroxine.    Kidney transplant status Continue cyclosporine and mycophenolate. Monitor renal function    Stage 3b chronic kidney disease Monitor renal function electrolytes.    Essential hypertension Continue amlodipine 5 mg p.o. daily Continue Cardura  1 mg p.o. at bedtime.  DVT prophylaxis: SCDs. Code Status: Full code.  Family Communication:  Disposition Plan: Observation for 24 to 48 hours due to AMS. Consults called:  Admission status: Duration/telemetry.   Reubin Milan MD [facility]  If 7PM-7AM, please contact night-coverage [email or web address]  11/29/2019, 7:36 PM   This document was prepared using Dragon voice recognition software and may contain some unintended transcription errors.

## 2019-11-29 NOTE — ED Notes (Signed)
CBG Results of 77 reported to [place], Therapist, sports.

## 2019-11-30 ENCOUNTER — Other Ambulatory Visit: Payer: Self-pay

## 2019-11-30 ENCOUNTER — Inpatient Hospital Stay (HOSPITAL_COMMUNITY): Payer: Medicare Other

## 2019-11-30 ENCOUNTER — Encounter (HOSPITAL_COMMUNITY): Payer: Self-pay | Admitting: Internal Medicine

## 2019-11-30 ENCOUNTER — Observation Stay (HOSPITAL_COMMUNITY): Payer: Medicare Other

## 2019-11-30 DIAGNOSIS — F319 Bipolar disorder, unspecified: Secondary | ICD-10-CM | POA: Diagnosis present

## 2019-11-30 DIAGNOSIS — G9341 Metabolic encephalopathy: Secondary | ICD-10-CM | POA: Diagnosis present

## 2019-11-30 DIAGNOSIS — N3000 Acute cystitis without hematuria: Secondary | ICD-10-CM | POA: Diagnosis not present

## 2019-11-30 DIAGNOSIS — G2401 Drug induced subacute dyskinesia: Secondary | ICD-10-CM | POA: Diagnosis present

## 2019-11-30 DIAGNOSIS — Z20822 Contact with and (suspected) exposure to covid-19: Secondary | ICD-10-CM | POA: Diagnosis present

## 2019-11-30 DIAGNOSIS — G92 Toxic encephalopathy: Secondary | ICD-10-CM | POA: Diagnosis not present

## 2019-11-30 DIAGNOSIS — R4182 Altered mental status, unspecified: Secondary | ICD-10-CM | POA: Diagnosis present

## 2019-11-30 DIAGNOSIS — R0902 Hypoxemia: Secondary | ICD-10-CM | POA: Diagnosis present

## 2019-11-30 DIAGNOSIS — Z940 Kidney transplant status: Secondary | ICD-10-CM | POA: Diagnosis not present

## 2019-11-30 DIAGNOSIS — R410 Disorientation, unspecified: Secondary | ICD-10-CM | POA: Diagnosis not present

## 2019-11-30 DIAGNOSIS — Z87440 Personal history of urinary (tract) infections: Secondary | ICD-10-CM | POA: Diagnosis not present

## 2019-11-30 DIAGNOSIS — N390 Urinary tract infection, site not specified: Secondary | ICD-10-CM | POA: Diagnosis present

## 2019-11-30 DIAGNOSIS — Z79899 Other long term (current) drug therapy: Secondary | ICD-10-CM | POA: Diagnosis not present

## 2019-11-30 DIAGNOSIS — E039 Hypothyroidism, unspecified: Secondary | ICD-10-CM

## 2019-11-30 DIAGNOSIS — Z8619 Personal history of other infectious and parasitic diseases: Secondary | ICD-10-CM | POA: Diagnosis not present

## 2019-11-30 DIAGNOSIS — I129 Hypertensive chronic kidney disease with stage 1 through stage 4 chronic kidney disease, or unspecified chronic kidney disease: Secondary | ICD-10-CM | POA: Diagnosis present

## 2019-11-30 DIAGNOSIS — I10 Essential (primary) hypertension: Secondary | ICD-10-CM | POA: Diagnosis not present

## 2019-11-30 DIAGNOSIS — D84821 Immunodeficiency due to drugs: Secondary | ICD-10-CM | POA: Diagnosis present

## 2019-11-30 LAB — CBC WITH DIFFERENTIAL/PLATELET
Abs Immature Granulocytes: 0.07 10*3/uL (ref 0.00–0.07)
Basophils Absolute: 0 10*3/uL (ref 0.0–0.1)
Basophils Relative: 0 %
Eosinophils Absolute: 0.1 10*3/uL (ref 0.0–0.5)
Eosinophils Relative: 1 %
HCT: 36 % (ref 36.0–46.0)
Hemoglobin: 12.1 g/dL (ref 12.0–15.0)
Immature Granulocytes: 1 %
Lymphocytes Relative: 24 %
Lymphs Abs: 2.3 10*3/uL (ref 0.7–4.0)
MCH: 30.8 pg (ref 26.0–34.0)
MCHC: 33.6 g/dL (ref 30.0–36.0)
MCV: 91.6 fL (ref 80.0–100.0)
Monocytes Absolute: 0.6 10*3/uL (ref 0.1–1.0)
Monocytes Relative: 7 %
Neutro Abs: 6.3 10*3/uL (ref 1.7–7.7)
Neutrophils Relative %: 67 %
Platelets: 171 10*3/uL (ref 150–400)
RBC: 3.93 MIL/uL (ref 3.87–5.11)
RDW: 12.3 % (ref 11.5–15.5)
WBC: 9.4 10*3/uL (ref 4.0–10.5)
nRBC: 0 % (ref 0.0–0.2)

## 2019-11-30 LAB — COMPREHENSIVE METABOLIC PANEL
ALT: 8 U/L (ref 0–44)
AST: 14 U/L — ABNORMAL LOW (ref 15–41)
Albumin: 2.9 g/dL — ABNORMAL LOW (ref 3.5–5.0)
Alkaline Phosphatase: 23 U/L — ABNORMAL LOW (ref 38–126)
Anion gap: 11 (ref 5–15)
BUN: 23 mg/dL (ref 8–23)
CO2: 17 mmol/L — ABNORMAL LOW (ref 22–32)
Calcium: 8.9 mg/dL (ref 8.9–10.3)
Chloride: 109 mmol/L (ref 98–111)
Creatinine, Ser: 1.49 mg/dL — ABNORMAL HIGH (ref 0.44–1.00)
GFR calc Af Amer: 42 mL/min — ABNORMAL LOW (ref >60)
GFR calc non Af Amer: 36 mL/min — ABNORMAL LOW (ref >60)
Glucose, Bld: 90 mg/dL (ref 70–99)
Potassium: 4.3 mmol/L (ref 3.5–5.1)
Sodium: 137 mmol/L (ref 135–145)
Total Bilirubin: 0.8 mg/dL (ref 0.3–1.2)
Total Protein: 5.5 g/dL — ABNORMAL LOW (ref 6.5–8.1)

## 2019-11-30 LAB — TSH: TSH: 1.818 u[IU]/mL (ref 0.350–4.500)

## 2019-11-30 LAB — HIV ANTIBODY (ROUTINE TESTING W REFLEX): HIV Screen 4th Generation wRfx: NONREACTIVE

## 2019-11-30 MED ORDER — SODIUM CHLORIDE 0.9 % IV SOLN
1.0000 g | INTRAVENOUS | Status: DC
  Administered 2019-11-30: 1 g via INTRAVENOUS
  Filled 2019-11-30: qty 10

## 2019-11-30 MED ORDER — SODIUM CHLORIDE 0.9 % IV SOLN: 1.0000 g | INTRAVENOUS | Status: DC

## 2019-11-30 MED ORDER — MEGESTROL ACETATE 20 MG PO TABS
20.0000 mg | ORAL_TABLET | Freq: Every day | ORAL | Status: DC
  Administered 2019-11-30 – 2019-12-06 (×7): 20 mg via ORAL
  Filled 2019-11-30 (×7): qty 1

## 2019-11-30 MED ORDER — HEPARIN SODIUM (PORCINE) 5000 UNIT/ML IJ SOLN
5000.0000 [IU] | Freq: Three times a day (TID) | INTRAMUSCULAR | Status: DC
  Administered 2019-11-30 – 2019-12-06 (×19): 5000 [IU] via SUBCUTANEOUS
  Filled 2019-11-30 (×19): qty 1

## 2019-11-30 NOTE — Progress Notes (Signed)
Spoke with son steven and updated him on pt care and plan. All questions answered to satisfaction. [place], RN 11/30/2019 1:49 PM

## 2019-11-30 NOTE — Plan of Care (Signed)
  Problem: Education: Goal: Knowledge of General Education information will improve Description: Including pain rating scale, medication(s)/side effects and non-pharmacologic comfort measures Outcome: Progressing   Problem: Health Behavior/Discharge Planning: Goal: Ability to manage health-related needs will improve Outcome: Progressing   Problem: Activity: Goal: Risk for activity intolerance will decrease Outcome: Progressing   

## 2019-11-30 NOTE — Evaluation (Signed)
Physical Therapy Evaluation Patient Details Name: Brenda Nolan MRN: [id number] DOB: 04-01-1951 Today's Date: 11/30/2019   History of Present Illness  Pt is a 69 yo female presenting after a fall from her WC with AMS. The pt had been reccently admitted and d/c from the ED (1/12) following a fall with head trauma, the pt returned to the same SNF, and then was readmitted 1/13 following a second fall from her WC with no head trauma. The pt  PMH is sig for anxiety, bipolar 1 disorders, stage IIIb CKD, oropharyngeal phase dysphagia, history of C. difficile colitis, hypothyroidism, kidney transplant status on immunosuppressants, and history of UTI.  Clinical Impression  Pt in bed upon arrival of PT, agreeable to PT evaluation at this time. The pt presents with deficits in functional mobility, safety, and stability due to above dx, which are all complicated by AMS and hx of cognitive deficits. The pt was able to demo good bed mobility and transfers with close min guard only for safety and verbal cues to assist with sequencing of tasks. The pt demos good strength but struggles to use her strength functionally to mobilize safely and independently. The pt will continue to benefit from skilled PT to maximize safety and stability with mobility prior to d/c back to SNF as pt will likely continue to require assistance and supervision for all mobility.     Follow Up Recommendations SNF;Supervision/Assistance - 24 hour    Equipment Recommendations  (defer to post-acute setting)    Recommendations for Other Services       Precautions / Restrictions Precautions Precautions: Fall Precaution Comments: pt with multiple frequent falls and poor cognition Restrictions Weight Bearing Restrictions: No      Mobility  Bed Mobility Overal bed mobility: Modified Independent;Needs Assistance Bed Mobility: Sidelying to Sit;Supine to Sit   Sidelying to sit: Supervision Supine to sit: Supervision     General bed  mobility comments: Pt able to move to sitting EOB and back to supine without assist, benefits from supervision for verbal cues for sequencing  Transfers Overall transfer level: Needs assistance Equipment used: Rolling walker (2 wheeled) Transfers: Sit to/from Stand Sit to Stand: Min guard         General transfer comment: pt able to complete without assist, min guard for safety  Ambulation/Gait Ambulation/Gait assistance: Min guard Gait Distance (Feet): 5 Feet Assistive device: Rolling walker (2 wheeled) Gait Pattern/deviations: Step-to pattern;Narrow base of support   Gait velocity interpretation: <1.31 ft/sec, indicative of household ambulator General Gait Details: pt ab;e to perform wt shift and lateral steps, needs min assist to initiate lateral movement of RW, but then able to follow the pattern.  Stairs            Wheelchair Mobility    Modified Rankin (Stroke Patients Only)       Balance Overall balance assessment: Needs assistance Sitting-balance support: Bilateral upper extremity supported;Feet supported Sitting balance-Leahy Scale: Fair Sitting balance - Comments: supervision     Standing balance-Leahy Scale: Poor Standing balance comment: pt benefits from UE support, safety risk due to poor cog understanding of safety                             Pertinent Vitals/Pain Pain Assessment: No/denies pain    Home Living Family/patient expects to be discharged to:: Skilled nursing facility                 Additional Comments: pt reportedly  fell out of WC, able to demo good use of RW during session, it is poosible she uses WC and RW for mobility at baseline.    Prior Function Level of Independence: Needs assistance   Gait / Transfers Assistance Needed: pt reportedly fell out of WC, able to demo good use of RW during session, it is poosible she uses WC and RW for mobility at baseline.  ADL's / Homemaking Assistance Needed: pt is not a  reliable hisptorian, likely required sig assist at SNF        Hand Dominance   Dominant Hand: Right    Extremity/Trunk Assessment   Upper Extremity Assessment Upper Extremity Assessment: Overall WFL for tasks assessed    Lower Extremity Assessment Lower Extremity Assessment: Overall WFL for tasks assessed    Cervical / Trunk Assessment Cervical / Trunk Assessment: Normal  Communication      Cognition Arousal/Alertness: Awake/alert Behavior During Therapy: Agitated;Restless Overall Cognitive Status: No family/caregiver present to determine baseline cognitive functioning(cog deficits at baseline, but unable to assess baseline as pt is poor historian) Area of Impairment: Attention;Safety/judgement;Problem solving                   Current Attention Level: Focused     Safety/Judgement: Decreased awareness of safety;Decreased awareness of deficits   Problem Solving: Slow processing;Decreased initiation;Difficulty sequencing;Requires verbal cues General Comments: Pt was cooperative and redirectable, but restless and agitated through eval due to desire to "put on clothes" the pt was wearing socks, a brief, and 2 gowns, but repeatedly insisted on putting on clothes. The pt eventually did express that she wanted clothes with pants, but had difficulty understanding why we could not provide pants.      General Comments      Exercises     Assessment/Plan    PT Assessment Patient needs continued PT services  PT Problem List Decreased mobility;Decreased safety awareness;Decreased range of motion;Decreased coordination;Decreased activity tolerance;Decreased cognition;Decreased balance;Decreased knowledge of use of DME       PT Treatment Interventions DME instruction;Therapeutic exercise;Gait training;Wheelchair mobility training;Balance training;Functional mobility training;Cognitive remediation;Therapeutic activities;Patient/family education    PT Goals (Current goals  can be found in the Care Plan section)  Acute Rehab PT Goals Patient Stated Goal: put on clothes PT Goal Formulation: With patient Time For Goal Achievement: 12/14/19 Potential to Achieve Goals: Good    Frequency Min 2X/week   Barriers to discharge        Co-evaluation               AM-PAC PT "6 Clicks" Mobility  Outcome Measure Help needed turning from your back to your side while in a flat bed without using bedrails?: A Little Help needed moving from lying on your back to sitting on the side of a flat bed without using bedrails?: A Little Help needed moving to and from a bed to a chair (including a wheelchair)?: A Little Help needed standing up from a chair using your arms (e.g., wheelchair or bedside chair)?: A Little Help needed to walk in hospital room?: A Little Help needed climbing 3-5 steps with a railing? : A Lot 6 Click Score: 17    End of Session Equipment Utilized During Treatment: Gait belt Activity Tolerance: Patient tolerated treatment well Patient left: in bed;with call bell/phone within reach;with bed alarm set;with nursing/sitter in room(chair position) Nurse Communication: Mobility status PT Visit Diagnosis: Difficulty in walking, not elsewhere classified (R26.2);Unsteadiness on feet (R26.81);History of falling (Z91.81)    Time: [id number]  PT Time Calculation (min) (ACUTE ONLY): 23 min   Charges:   PT Evaluation $PT Eval Moderate Complexity: 1 Mod PT Treatments $Therapeutic Activity: 8-22 mins        Karma Ganja, PT, DPT   Acute Rehabilitation Department Pager #: [phone]  Otho Bellows 11/30/2019, 3:48 PM

## 2019-11-30 NOTE — Progress Notes (Signed)
Progress Note    Brenda Nolan  [id number] DOB: Apr 22, 1951  DOA: 11/29/2019 PCP: Patient, No Pcp Per    Brief Narrative:     Medical records reviewed and are as summarized below:  Brenda Nolan is an 69 y.o. female with medical history significant of anxiety, bipolar 1 disorders, stage IIIb chronic kidney disease, unspecified difficulty walking history, oropharyngeal phase dysphagia, history of C. difficile colitis, hypothyroidism, muscle weakness, kidney transplant status on immunosuppressants, history of UTI episodes associated with AMS who is brought to the emergency department due to decreasing mentation in the past 3 days.  Facility reported a fall with head trauma about a week ago.  She was brought to the ER on (11/27/2019) Monday evening and had a CT scan that was negative.  She was seen by behavioral health who recommended an overnight observation in the ED and she was later discharged yesterday morning.  However, she returns today after sliding out of her wheelchair.  She is oriented to name, she knows she is in the hospital, but disoriented to date and recent events.  She is unable to provide further history.  Assessment/Plan:   Principal Problem:   Altered mental status Active Problems:   Bipolar 1 disorder (HCC)   Hypothyroidism   Kidney transplant status   Stage 3b chronic kidney disease   Essential hypertension   AMS (altered mental status)     Altered mental status due to UTI Culture with gram neg- await sensitivities -continue IV rocephin and monitor for response -her baseline is living in an ALF but able to dress self and carry on a clear conversation    Bipolar 1 disorder (HCC) Continue Depakote ER 500 mg at bedtime.. Continue clonazepam as needed. -on trilafon  -has some tardive dyskinesia but has been worse over last few days    Hypothyroidism Continue levothyroxine. -tsh normal    Kidney transplant status -Continue cyclosporine and  mycophenolate. -Monitor renal function    Stage 3b chronic kidney disease -trend    Essential hypertension Continue amlodipine 5 mg p.o. daily Continue Cardura 1 mg p.o. at bedtime.   Family Communication/Anticipated D/C date and plan/Code Status   DVT prophylaxis: heparin Code Status: Full Code.  Family Communication: spoke with [place] from her ALF to get baseline Disposition Plan: pending improvement of symptoms   Medical Consultants:    None.     Subjective:   C/o being cold  Objective:    Vitals:   11/30/19 0335 11/30/19 0836 11/30/19 0900 11/30/19 1320  BP: 113/75 133/76  121/72  Pulse: 80 83  85  Resp: 19 (!) 22  (!) 24  Temp: 97.7 F (36.5 C) 98 F (36.7 C)  98.2 F (36.8 C)  TempSrc:  Oral  Oral  SpO2: 99% 100%  100%  Weight:   81 kg   Height:   5\' 7"  (1.702 m)     Intake/Output Summary (Last 24 hours) at 11/30/2019 1420 Last data filed at 11/30/2019 1209 Gross per 24 hour  Intake 856.37 ml  Output 1500 ml  Net -643.63 ml   Filed Weights   11/30/19 0900  Weight: 81 kg    Exam: In bed under 6 blankets rrr No increased work of breathing Tremor Signs of tardive dyskinesia with abnormal tongue movement  Data Reviewed:   I have personally reviewed following labs and imaging studies:  Labs: Labs show the following:   Basic Metabolic Panel: Recent Labs  Lab 11/27/19 1654 11/27/19 1654 11/29/19 1640 11/30/19  0514  NA 141  --  136 137  K 3.9   < > 3.9 4.3  CL 109  --  104 109  CO2 22  --  18* 17*  GLUCOSE 103*  --  91 90  BUN 24*  --  23 23  CREATININE 1.39*  --  1.52* 1.49*  CALCIUM 9.7  --  10.1 8.9   < > = values in this interval not displayed.   GFR Estimated Creatinine Clearance: 40.1 mL/min (A) (by C-G formula based on SCr of 1.49 mg/dL (H)). Liver Function Tests: Recent Labs  Lab 11/27/19 1654 11/29/19 1640 11/30/19 0514  AST 19 19 14*  ALT 10 11 8   ALKPHOS 26* 31* 23*  BILITOT 0.6 1.3* 0.8  PROT 7.0 7.1  5.5*  ALBUMIN 4.0 3.7 2.9*   No results for input(s): LIPASE, AMYLASE in the last 168 hours. Recent Labs  Lab 11/29/19 1640  AMMONIA 18   Coagulation profile Recent Labs  Lab 11/29/19 1852  INR 1.1    CBC: Recent Labs  Lab 11/27/19 1654 11/29/19 1640 11/30/19 0514  WBC 7.6 12.8* 9.4  NEUTROABS  --  9.2* 6.3  HGB 14.0 14.0 12.1  HCT 42.2 41.4 36.0  MCV 92.1 91.0 91.6  PLT 227 220 171   Cardiac Enzymes: No results for input(s): CKTOTAL, CKMB, CKMBINDEX, TROPONINI in the last 168 hours. BNP (last 3 results) No results for input(s): PROBNP in the last 8760 hours. CBG: Recent Labs  Lab 11/29/19 1621  GLUCAP 77   D-Dimer: No results for input(s): DDIMER in the last 72 hours. Hgb A1c: No results for input(s): HGBA1C in the last 72 hours. Lipid Profile: No results for input(s): CHOL, HDL, LDLCALC, TRIG, CHOLHDL, LDLDIRECT in the last 72 hours. Thyroid function studies: Recent Labs    11/30/19 1045  TSH 1.818   Anemia work up: No results for input(s): VITAMINB12, FOLATE, FERRITIN, TIBC, IRON, RETICCTPCT in the last 72 hours. Sepsis Labs: Recent Labs  Lab 11/27/19 1654 11/29/19 1640 11/30/19 0514  PROCALCITON  --  1.07  --   WBC 7.6 12.8* 9.4  LATICACIDVEN 1.2 1.2  --     Microbiology Recent Results (from the past 240 hour(s))  Respiratory Panel by RT PCR (Flu A&B, Covid) - Nasopharyngeal Swab     Status: None   Collection Time: 11/27/19  4:56 PM   Specimen: Nasopharyngeal Swab  Result Value Ref Range Status   SARS Coronavirus 2 by RT PCR NEGATIVE NEGATIVE Final    Comment: (NOTE) SARS-CoV-2 target nucleic acids are NOT DETECTED. The SARS-CoV-2 RNA is generally detectable in upper respiratoy specimens during the acute phase of infection. The lowest concentration of SARS-CoV-2 viral copies this assay can detect is 131 copies/mL. A negative result does not preclude SARS-Cov-2 infection and should not be used as the sole basis for treatment or other  patient management decisions. A negative result may occur with  improper specimen collection/handling, submission of specimen other than nasopharyngeal swab, presence of viral mutation(s) within the areas targeted by this assay, and inadequate number of viral copies (<131 copies/mL). A negative result must be combined with clinical observations, patient history, and epidemiological information. The expected result is Negative. Fact Sheet for Patients:  PinkCheek.be Fact Sheet for Healthcare Providers:  GravelBags.it This test is not yet ap proved or cleared by the [place] FDA and  has been authorized for detection and/or diagnosis of SARS-CoV-2 by FDA under an Emergency Use Authorization (EUA). This EUA will  remain  in effect (meaning this test can be used) for the duration of the COVID-19 declaration under Section 564(b)(1) of the Act, 21 U.S.C. section 360bbb-3(b)(1), unless the authorization is terminated or revoked sooner.    Influenza A by PCR NEGATIVE NEGATIVE Final   Influenza B by PCR NEGATIVE NEGATIVE Final    Comment: (NOTE) The Xpert Xpress SARS-CoV-2/FLU/RSV assay is intended as an aid in  the diagnosis of influenza from Nasopharyngeal swab specimens and  should not be used as a sole basis for treatment. Nasal washings and  aspirates are unacceptable for Xpert Xpress SARS-CoV-2/FLU/RSV  testing. Fact Sheet for Patients: PinkCheek.be Fact Sheet for Healthcare Providers: GravelBags.it This test is not yet approved or cleared by the [place] FDA and  has been authorized for detection and/or diagnosis of SARS-CoV-2 by  FDA under an Emergency Use Authorization (EUA). This EUA will remain  in effect (meaning this test can be used) for the duration of the  Covid-19 declaration under Section 564(b)(1) of the Act, 21  U.S.C. section 360bbb-3(b)(1), unless  the authorization is  terminated or revoked. Performed at [facility], [place] [address] [place], [place] [address]   Blood Cultures (routine x 2)     Status: None (Preliminary result)   Collection Time: 11/29/19  4:40 PM   Specimen: BLOOD  Result Value Ref Range Status   Specimen Description BLOOD LEFT ANTECUBITAL  Final   Special Requests   Final    BOTTLES DRAWN AEROBIC AND ANAEROBIC Blood Culture adequate volume   Culture   Final    NO GROWTH < 24 HOURS Performed at [facility], [place] [address] [place], [place] [address]    Report Status PENDING  Incomplete  Urine culture     Status: Abnormal (Preliminary result)   Collection Time: 11/29/19  5:02 PM   Specimen: In/Out Cath Urine  Result Value Ref Range Status   Specimen Description IN/OUT CATH URINE  Final   Special Requests NONE  Final   Culture (A)  Final    >=100,000 COLONIES/mL ENTEROCOCCUS FAECALIS SUSCEPTIBILITIES TO FOLLOW Performed at [facility], [place] [address], [place], [place] [address]    Report Status PENDING  Incomplete  Blood Cultures (routine x 2)     Status: None (Preliminary result)   Collection Time: 11/29/19  6:51 PM   Specimen: BLOOD RIGHT HAND  Result Value Ref Range Status   Specimen Description BLOOD RIGHT HAND  Final   Special Requests   Final    BOTTLES DRAWN AEROBIC AND ANAEROBIC Blood Culture adequate volume   Culture   Final    NO GROWTH < 24 HOURS Performed at [facility], [place] [address] [place],  [address]    Report Status PENDING  Incomplete  Respiratory Panel by RT PCR (Flu A&B, Covid) - Nasopharyngeal Swab     Status: None   Collection Time: 11/29/19  7:10 PM   Specimen: Nasopharyngeal Swab  Result Value Ref Range Status   SARS Coronavirus 2 by RT PCR NEGATIVE NEGATIVE Final    Comment: (NOTE) SARS-CoV-2 target nucleic acids are NOT DETECTED. The SARS-CoV-2 RNA is generally detectable in upper respiratoy specimens during the acute  phase of infection. The lowest concentration of SARS-CoV-2 viral copies this assay can detect is 131 copies/mL. A negative result does not preclude SARS-Cov-2 infection and should not be used as the sole basis for treatment or other patient management decisions. A negative result may occur with  improper specimen collection/handling, submission of specimen other than nasopharyngeal swab, presence of viral mutation(s) within the areas targeted by this assay, and inadequate number of viral copies (<131 copies/mL). A negative result must be combined with clinical observations, patient history, and epidemiological information. The expected result is Negative. Fact Sheet for Patients:  PinkCheek.be Fact Sheet for Healthcare Providers:  GravelBags.it This test is not yet ap proved or cleared by the [place] FDA and  has been authorized for detection and/or diagnosis of SARS-CoV-2 by FDA under an Emergency Use Authorization (EUA). This EUA will remain  in effect (meaning this test can be used) for the duration of the COVID-19 declaration under Section 564(b)(1) of the Act, 21 U.S.C. section 360bbb-3(b)(1), unless the authorization is terminated or revoked sooner.    Influenza A by PCR NEGATIVE NEGATIVE Final   Influenza B by PCR NEGATIVE NEGATIVE Final    Comment: (NOTE) The Xpert Xpress SARS-CoV-2/FLU/RSV assay is intended as an aid in  the diagnosis of influenza from Nasopharyngeal swab specimens and  should not be used as a sole basis for treatment. Nasal washings and  aspirates are unacceptable for Xpert Xpress SARS-CoV-2/FLU/RSV  testing. Fact Sheet for Patients: PinkCheek.be Fact Sheet for Healthcare Providers: GravelBags.it This test is not yet approved or cleared by the [place] FDA and  has been authorized for detection and/or diagnosis of SARS-CoV-2 by    FDA under an Emergency Use Authorization (EUA). This EUA will remain  in effect (meaning this test can be used) for the duration of the  Covid-19 declaration under Section 564(b)(1) of the Act, 21  U.S.C. section 360bbb-3(b)(1), unless the authorization is  terminated or revoked. Performed at [facility], [place] [address] [place], [place] [address]     Procedures and diagnostic studies:  CT HEAD WO CONTRAST  Result Date: 11/30/2019 CLINICAL DATA:  Fall from wheelchair EXAM: CT HEAD WITHOUT CONTRAST TECHNIQUE: Contiguous axial images were obtained from the base of the skull through the vertex without intravenous contrast. COMPARISON:  11/28/2019 FINDINGS: Brain: No evidence of acute infarction, hemorrhage, hydrocephalus, extra-axial collection or mass lesion/mass effect. Periventricular white matter hypodensity. Vascular: No hyperdense vessel or unexpected calcification. Skull: Normal. Negative for fracture or focal lesion. Sinuses/Orbits: No acute finding. Other: None. IMPRESSION: No acute intracranial pathology.  Small-vessel white matter disease. Electronically Signed   By: Eddie Candle M.D.   On: 11/30/2019 09:41   DG Chest Port 1 View  Result Date: 11/29/2019 CLINICAL DATA:  [place] 1 week ago, decline in function and mental status since EXAM: PORTABLE CHEST 1 VIEW COMPARISON:  Portable exam 1620 hours compared to 01/31/2018 at FINDINGS: Normal heart size, mediastinal contours, and pulmonary vascularity. Lungs clear. No pulmonary infiltrate, pleural effusion or pneumothorax. Bones demineralized. IMPRESSION: No acute abnormalities. Electronically Signed   By: Lavonia Dana M.D.   On: 11/29/2019 16:33    Medications:   . amLODipine  5 mg Oral Daily  . cycloSPORINE  125 mg Oral Daily  . cycloSPORINE  150 mg Oral QHS  . divalproex  500 mg Oral QHS  . doxazosin  1 mg Oral QHS  . fenofibrate  160 mg Oral Daily  . levothyroxine  50 mcg Oral Q0600  . magnesium oxide  400 mg Oral Daily   . megestrol  20 mg Oral Daily  . metoprolol tartrate  100 mg Oral BID  . mycophenolate  250 mg Oral BID  . perphenazine  8 mg Oral QHS   Continuous Infusions: Marland Kitchen [  START ON 12/01/2019] cefTRIAXone (ROCEPHIN)  IV       LOS: 0 days   Geradine Girt  [facility]   How to contact the [facility] Attending or Consulting provider [place] or covering provider during after hours [place], for this patient?  1. Check the care team in [facility] and look for a) attending/consulting TRH provider listed and b) the [facility] team listed 2. Log into [email or web address] and use [place]'s universal password to access. If you do not have the password, please contact the hospital operator. 3. Locate the [facility] provider you are looking for under [facility] and page to a number that you can be directly reached. 4. If you still have difficulty reaching the provider, please page the [facility] (Director on Call) for the Hospitalists listed on amion for assistance.  11/30/2019, 2:20 PM

## 2019-12-01 ENCOUNTER — Encounter (HOSPITAL_COMMUNITY): Payer: Self-pay | Admitting: Internal Medicine

## 2019-12-01 LAB — CBC WITH DIFFERENTIAL/PLATELET
Abs Immature Granulocytes: 0.04 10*3/uL (ref 0.00–0.07)
Basophils Absolute: 0 10*3/uL (ref 0.0–0.1)
Basophils Relative: 0 %
Eosinophils Absolute: 0 10*3/uL (ref 0.0–0.5)
Eosinophils Relative: 1 %
HCT: 34.5 % — ABNORMAL LOW (ref 36.0–46.0)
Hemoglobin: 11.7 g/dL — ABNORMAL LOW (ref 12.0–15.0)
Immature Granulocytes: 1 %
Lymphocytes Relative: 22 %
Lymphs Abs: 1.5 10*3/uL (ref 0.7–4.0)
MCH: 30.6 pg (ref 26.0–34.0)
MCHC: 33.9 g/dL (ref 30.0–36.0)
MCV: 90.3 fL (ref 80.0–100.0)
Monocytes Absolute: 0.6 10*3/uL (ref 0.1–1.0)
Monocytes Relative: 9 %
Neutro Abs: 4.4 10*3/uL (ref 1.7–7.7)
Neutrophils Relative %: 67 %
Platelets: 182 10*3/uL (ref 150–400)
RBC: 3.82 MIL/uL — ABNORMAL LOW (ref 3.87–5.11)
RDW: 12.3 % (ref 11.5–15.5)
WBC: 6.6 10*3/uL (ref 4.0–10.5)
nRBC: 0 % (ref 0.0–0.2)

## 2019-12-01 LAB — URINE CULTURE: Culture: >=100000 — AB

## 2019-12-01 MED ORDER — SODIUM CHLORIDE 0.9 % IV SOLN
2.0000 g | Freq: Three times a day (TID) | INTRAVENOUS | Status: DC
  Administered 2019-12-01 – 2019-12-03 (×6): 2 g via INTRAVENOUS
  Filled 2019-12-01: qty 2000
  Filled 2019-12-01: qty 2
  Filled 2019-12-01: qty 2000
  Filled 2019-12-01: qty 2
  Filled 2019-12-01 (×4): qty 2000

## 2019-12-01 MED ORDER — SALINE SPRAY 0.65 % NA SOLN
1.0000 | NASAL | Status: DC | PRN
  Filled 2019-12-01: qty 44

## 2019-12-01 NOTE — Evaluation (Signed)
Clinical/Bedside Swallow Evaluation Patient Details  Name: Brenda Nolan MRN: [id number] Date of Birth: 05/20/1951  Today's Date: 12/01/2019 Time: SLP Start Time (ACUTE ONLY): 1003 SLP Stop Time (ACUTE ONLY): 1030 SLP Time Calculation (min) (ACUTE ONLY): 27 min  Past Medical History:  Past Medical History:  Diagnosis Date  . Anxiety 2019  . Bipolar 1 disorder ([place]) 2019  . Chronic kidney disease (CKD)    Stage 1  . Difficulty in walking, not elsewhere classified   . Dysphagia, oropharyngeal phase   . Enterocolitis due to Clostridioides difficile 2019  . Hypothyroidism 2019  . Kidney transplant status   . Muscle weakness   . Urinary tract infection    Past Surgical History: History reviewed. No pertinent surgical history. HPI:  Pt is a 69 yo female presenting after a fall from her WC with AMS. The pt had been recently admitted and d/c from the ED (1/12) following a fall with head trauma, the pt returned to the same SNF, and then was readmitted 1/13 following a second fall from her WC with no head trauma. The pt  PMH is sig for anxiety, bipolar 1 disorders, stage IIIb CKD,, history of C. difficile colitis, hypothyroidism, kidney transplant status on immunosuppressants, and history of UTI.  Hx of oropharyngeal dysphagia mentioned in physician HPI; however, no further elaboration and unable to find additional documentation.     Assessment / Plan / Recommendation Clinical Impression  Pt was seen for a bedside swallow evaluation and she presents with oral dysphagia and possible pharyngeal dysphagia.  RN reported that pt had been tachypneic since admission with RR ranging from the 20s-low 50s on occasion.  She additionally stated that O2 saturation has been observed to drop during PO intake.  Pt denied hx of dysphagia, but no family was present to corroborate her statement.  Oral mech exam was unremarkable.  Pt consumed trials of thin liquid, puree, and regular solids.  She was observed to be  very impulsive, taking multiple large bites/sips in a row despite verbal cues.  Tactile cues to limit bolus size and slow rate of intake were beneficial.  Pt's O2 saturation was observed to drop from the mid 90s to the mid-high 80s following serial sips of thin liquid and large bites of regular solids.  O2 remained stable during single sips of liquid and small bites of solids with additional cues to take breaks in between each bite/sip.  No overt s/sx of aspiration were observed with any trials, including the [employer]; however unable to r/o silent aspiration.  Pt's O2 saturation dropped to the low 70s greater than 5 minutes following PO intake.  RN was made aware and a nasal cannula was placed.  RN additionally replaced vital sensor and O2 saturation rose to the high 90s.  Recommend diet change to Dysphagia 3 (mech soft) solids and continuation of thin liquid with full supervision to cue for the following compensatory strategies; 1) Small bites/sips 2) Slow rate of intake 3) Sit upright 90 degrees 4) Take rest breaks in between each bite 5) Pause PO intake if SOB.  Pt may benefit from an instrumental swallow study to r/o silent aspiration.  SLP will f/u for diagnostic treatment per POC.   SLP Visit Diagnosis: Dysphagia, unspecified (R13.10)    Aspiration Risk  Mild aspiration risk    Diet Recommendation Dysphagia 3 (Mech soft);Thin liquid   Liquid Administration via: Cup;Straw Medication Administration: Whole meds with puree Supervision: Staff to assist with self feeding;Full supervision/cueing  for compensatory strategies Compensations: Minimize environmental distractions;Slow rate;Small sips/bites Postural Changes: Seated upright at 90 degrees    Other  Recommendations Oral Care Recommendations: Oral care BID;Staff/trained caregiver to provide oral care   Follow up Recommendations Skilled Nursing facility;24 hour supervision/assistance      Frequency and Duration min 2x/week  2  weeks       Prognosis Prognosis for Safe Diet Advancement: [place] Date of Onset: 12/01/19 HPI: Pt is a 69 yo female presenting after a fall from her WC with AMS. The pt had been reccently admitted and d/c from the ED (1/12) following a fall with head trauma, the pt returned to the same SNF, and then was readmitted 1/13 following a second fall from her WC with no head trauma. The pt  PMH is sig for anxiety, bipolar 1 disorders, stage IIIb CKD,, history of C. difficile colitis, hypothyroidism, kidney transplant status on immunosuppressants, and history of UTI.  Hx of oropharyngeal dysphagia mentioned in physician HPI; however, no further elaboration and unable to find additional documentation.   Type of Study: Bedside Swallow Evaluation Previous Swallow Assessment: None documented  Diet Prior to this Study: Thin liquids;Regular Temperature Spikes Noted: Yes Respiratory Status: Room air;Nasal cannula;Other (comment)(swittch to  at end of session ) History of Recent Intubation: No Behavior/Cognition: Alert;Impulsive;Requires cueing Oral Cavity Assessment: Within Functional Limits Oral Care Completed by SLP: No Oral Cavity - Dentition: Adequate natural dentition Vision: Functional for self-feeding Self-Feeding Abilities: Needs assist Patient Positioning: Upright in bed Baseline Vocal Quality: Normal Volitional Swallow: Able to elicit    Oral/Motor/Sensory Function Overall Oral Motor/Sensory Function: Within functional limits   Ice Chips Ice chips: Not tested   Thin Liquid Thin Liquid: Impaired Presentation: Cup;Straw Pharyngeal  Phase Impairments: Change in Vital Signs Other Comments: desaturation with serial sips     Nectar Thick Nectar Thick Liquid: Not tested   Honey Thick Honey Thick Liquid: Not tested   Puree Puree: Within functional limits Presentation: Spoon   Solid     Solid: Impaired Presentation: Self Fed Oral Phase Functional Implications:  Prolonged oral transit Pharyngeal Phase Impairments: Change in Vital Signs     Brenda Nolan M.S., CCC-SLP Acute Rehabilitation Services Office: [phone]  Brenda Nolan Brenda Nolan 12/01/2019,10:53 AM

## 2019-12-01 NOTE — Plan of Care (Signed)
  Problem: Activity: Goal: Risk for activity intolerance will decrease Outcome: Progressing   Problem: Nutrition: Goal: Adequate nutrition will be maintained Outcome: Progressing   Problem: Education: Goal: Knowledge of General Education information will improve Description: Including pain rating scale, medication(s)/side effects and non-pharmacologic comfort measures Outcome: Not Progressing   Problem: Health Behavior/Discharge Planning: Goal: Ability to manage health-related needs will improve Outcome: Not Progressing

## 2019-12-01 NOTE — Progress Notes (Signed)
Progress Note    Brenda Nolan  [id number] DOB: 16-Feb-1951  DOA: 11/29/2019 PCP: Patient, No Pcp Per    Brief Narrative:     Medical records reviewed and are as summarized below:  Brenda Nolan is an 69 y.o. female with medical history significant of anxiety, bipolar 1 disorders, stage IIIb chronic kidney disease, unspecified difficulty walking history, oropharyngeal phase dysphagia, history of C. difficile colitis, hypothyroidism, muscle weakness, kidney transplant status on immunosuppressants, history of UTI episodes associated with AMS who is brought to the emergency department due to decreasing mentation in the past 3 days.  Facility reported a fall with head trauma about a week ago.  She was brought to the ER on (11/27/2019) Monday evening and had a CT scan that was negative.  She was seen by behavioral health who recommended an overnight observation in the ED and she was later discharged yesterday morning.  However, she returns today after sliding out of her wheelchair.  She is oriented to name, she knows she is in the hospital, but disoriented to date and recent events.  She is unable to provide further history.  Assessment/Plan:   Principal Problem:   Altered mental status Active Problems:   Bipolar 1 disorder (HCC)   Hypothyroidism   Kidney transplant status   Stage 3b chronic kidney disease   Essential hypertension   AMS (altered mental status)     Altered mental status due to UTI Culture with VRE -IV PCN -her baseline is living in an ALF but able to dress self and carry on a clear conversation- per son, not all put together does have tremors at baseline     Bipolar 1 disorder (HCC) Continue Depakote ER 500 mg at bedtime.. Continue clonazepam as needed. -on trilafon  -has some tardive dyskinesia but has been worse over last few days  Hypoxia with eating -seen by SLP -needs smaller bites and more control -no sign of aspiration    Hypothyroidism Continue  levothyroxine. -tsh normal    Kidney transplant status -Continue cyclosporine and mycophenolate. -Monitor renal function    Stage 3b chronic kidney disease -trend    Essential hypertension Continue amlodipine 5 mg p.o. daily Continue Cardura 1 mg p.o. at bedtime.   Family Communication/Anticipated D/C date and plan/Code Status   DVT prophylaxis: heparin Code Status: Full Code.  Family Communication: called son-- I will call again on Sunday after he speaks to her on the phone to see if back to her baseline Disposition Plan: pending improvement of symptoms- -suspect Monday/Tuesday   Medical Consultants:    None.     Subjective:   C/o being cold  Objective:    Vitals:   12/01/19 0312 12/01/19 0630 12/01/19 0948 12/01/19 1158  BP: 119/73 112/76 139/75 128/82  Pulse: 82 73 76 91  Resp: 20 20 (!) 27 (!) 28  Temp: 98.9 F (37.2 C) 98.4 F (36.9 C) 98.8 F (37.1 C) 98.9 F (37.2 C)  TempSrc:  Oral Oral Oral  SpO2: 100% 96% 100% 100%  Weight:      Height:        Intake/Output Summary (Last 24 hours) at 12/01/2019 1238 Last data filed at 12/01/2019 0300 Gross per 24 hour  Intake 100 ml  Output 400 ml  Net -300 ml   Filed Weights   11/30/19 0900  Weight: 81 kg    Exam: In bed under 6 blankets rrr No increased work of breathing Tremor Signs of tardive dyskinesia with abnormal tongue movement  Data Reviewed:   I have personally reviewed following labs and imaging studies:  Labs: Labs show the following:   Basic Metabolic Panel: Recent Labs  Lab 11/27/19 1654 11/27/19 1654 11/29/19 1640 11/30/19 0514  NA 141  --  136 137  K 3.9   < > 3.9 4.3  CL 109  --  104 109  CO2 22  --  18* 17*  GLUCOSE 103*  --  91 90  BUN 24*  --  23 23  CREATININE 1.39*  --  1.52* 1.49*  CALCIUM 9.7  --  10.1 8.9   < > = values in this interval not displayed.   GFR Estimated Creatinine Clearance: 40.1 mL/min (A) (by C-G formula based on SCr of 1.49 mg/dL  (H)). Liver Function Tests: Recent Labs  Lab 11/27/19 1654 11/29/19 1640 11/30/19 0514  AST 19 19 14*  ALT 10 11 8   ALKPHOS 26* 31* 23*  BILITOT 0.6 1.3* 0.8  PROT 7.0 7.1 5.5*  ALBUMIN 4.0 3.7 2.9*   No results for input(s): LIPASE, AMYLASE in the last 168 hours. Recent Labs  Lab 11/29/19 1640  AMMONIA 18   Coagulation profile Recent Labs  Lab 11/29/19 1852  INR 1.1    CBC: Recent Labs  Lab 11/27/19 1654 11/29/19 1640 11/30/19 0514 12/01/19 0247  WBC 7.6 12.8* 9.4 6.6  NEUTROABS  --  9.2* 6.3 4.4  HGB 14.0 14.0 12.1 11.7*  HCT 42.2 41.4 36.0 34.5*  MCV 92.1 91.0 91.6 90.3  PLT 227 220 171 182   Cardiac Enzymes: No results for input(s): CKTOTAL, CKMB, CKMBINDEX, TROPONINI in the last 168 hours. BNP (last 3 results) No results for input(s): PROBNP in the last 8760 hours. CBG: Recent Labs  Lab 11/29/19 1621  GLUCAP 77   D-Dimer: No results for input(s): DDIMER in the last 72 hours. Hgb A1c: No results for input(s): HGBA1C in the last 72 hours. Lipid Profile: No results for input(s): CHOL, HDL, LDLCALC, TRIG, CHOLHDL, LDLDIRECT in the last 72 hours. Thyroid function studies: Recent Labs    11/30/19 1045  TSH 1.818   Anemia work up: No results for input(s): VITAMINB12, FOLATE, FERRITIN, TIBC, IRON, RETICCTPCT in the last 72 hours. Sepsis Labs: Recent Labs  Lab 11/27/19 1654 11/29/19 1640 11/30/19 0514 12/01/19 0247  PROCALCITON  --  1.07  --   --   WBC 7.6 12.8* 9.4 6.6  LATICACIDVEN 1.2 1.2  --   --     Microbiology Recent Results (from the past 240 hour(s))  Respiratory Panel by RT PCR (Flu A&B, Covid) - Nasopharyngeal Swab     Status: None   Collection Time: 11/27/19  4:56 PM   Specimen: Nasopharyngeal Swab  Result Value Ref Range Status   SARS Coronavirus 2 by RT PCR NEGATIVE NEGATIVE Final    Comment: (NOTE) SARS-CoV-2 target nucleic acids are NOT DETECTED. The SARS-CoV-2 RNA is generally detectable in upper respiratoy specimens  during the acute phase of infection. The lowest concentration of SARS-CoV-2 viral copies this assay can detect is 131 copies/mL. A negative result does not preclude SARS-Cov-2 infection and should not be used as the sole basis for treatment or other patient management decisions. A negative result may occur with  improper specimen collection/handling, submission of specimen other than nasopharyngeal swab, presence of viral mutation(s) within the areas targeted by this assay, and inadequate number of viral copies (<131 copies/mL). A negative result must be combined with clinical observations, patient history, and epidemiological information. The expected  result is Negative. Fact Sheet for Patients:  PinkCheek.be Fact Sheet for Healthcare Providers:  GravelBags.it This test is not yet ap proved or cleared by the [place] FDA and  has been authorized for detection and/or diagnosis of SARS-CoV-2 by FDA under an Emergency Use Authorization (EUA). This EUA will remain  in effect (meaning this test can be used) for the duration of the COVID-19 declaration under Section 564(b)(1) of the Act, 21 U.S.C. section 360bbb-3(b)(1), unless the authorization is terminated or revoked sooner.    Influenza A by PCR NEGATIVE NEGATIVE Final   Influenza B by PCR NEGATIVE NEGATIVE Final    Comment: (NOTE) The Xpert Xpress SARS-CoV-2/FLU/RSV assay is intended as an aid in  the diagnosis of influenza from Nasopharyngeal swab specimens and  should not be used as a sole basis for treatment. Nasal washings and  aspirates are unacceptable for Xpert Xpress SARS-CoV-2/FLU/RSV  testing. Fact Sheet for Patients: PinkCheek.be Fact Sheet for Healthcare Providers: GravelBags.it This test is not yet approved or cleared by the [place] FDA and  has been authorized for detection and/or diagnosis of  SARS-CoV-2 by  FDA under an Emergency Use Authorization (EUA). This EUA will remain  in effect (meaning this test can be used) for the duration of the  Covid-19 declaration under Section 564(b)(1) of the Act, 21  U.S.C. section 360bbb-3(b)(1), unless the authorization is  terminated or revoked. Performed at [facility], [place] [address], [place], [place] [address]   Blood Cultures (routine x 2)     Status: None (Preliminary result)   Collection Time: 11/29/19  4:40 PM   Specimen: BLOOD  Result Value Ref Range Status   Specimen Description BLOOD LEFT ANTECUBITAL  Final   Special Requests   Final    BOTTLES DRAWN AEROBIC AND ANAEROBIC Blood Culture adequate volume   Culture   Final    NO GROWTH < 24 HOURS Performed at [facility], [place] [address] [place], [place] [address]    Report Status PENDING  Incomplete  Urine culture     Status: Abnormal   Collection Time: 11/29/19  5:02 PM   Specimen: In/Out Cath Urine  Result Value Ref Range Status   Specimen Description IN/OUT CATH URINE  Final   Special Requests   Final    NONE Performed at [facility], [place] [address] [place], [place] [address]    Culture (A)  Final    >=100,000 COLONIES/mL VANCOMYCIN RESISTANT ENTEROCOCCUS   Report Status 12/01/2019 FINAL  Final   Organism ID, Bacteria VANCOMYCIN RESISTANT ENTEROCOCCUS (A)  Final      Susceptibility   Vancomycin resistant enterococcus - MIC*    AMPICILLIN <=2 SENSITIVE Sensitive     NITROFURANTOIN <=16 SENSITIVE Sensitive     VANCOMYCIN >=32 RESISTANT Resistant     LINEZOLID 2 SENSITIVE Sensitive     * >=100,000 COLONIES/mL VANCOMYCIN RESISTANT ENTEROCOCCUS  Blood Cultures (routine x 2)     Status: None (Preliminary result)   Collection Time: 11/29/19  6:51 PM   Specimen: BLOOD RIGHT HAND  Result Value Ref Range Status   Specimen Description BLOOD RIGHT HAND  Final   Special Requests   Final    BOTTLES DRAWN AEROBIC AND ANAEROBIC Blood Culture  adequate volume   Culture   Final    NO GROWTH < 24 HOURS Performed at [facility], [place] [address] [place], [place] [address]    Report Status PENDING  Incomplete  Respiratory  Panel by RT PCR (Flu A&B, Covid) - Nasopharyngeal Swab     Status: None   Collection Time: 11/29/19  7:10 PM   Specimen: Nasopharyngeal Swab  Result Value Ref Range Status   SARS Coronavirus 2 by RT PCR NEGATIVE NEGATIVE Final    Comment: (NOTE) SARS-CoV-2 target nucleic acids are NOT DETECTED. The SARS-CoV-2 RNA is generally detectable in upper respiratoy specimens during the acute phase of infection. The lowest concentration of SARS-CoV-2 viral copies this assay can detect is 131 copies/mL. A negative result does not preclude SARS-Cov-2 infection and should not be used as the sole basis for treatment or other patient management decisions. A negative result may occur with  improper specimen collection/handling, submission of specimen other than nasopharyngeal swab, presence of viral mutation(s) within the areas targeted by this assay, and inadequate number of viral copies (<131 copies/mL). A negative result must be combined with clinical observations, patient history, and epidemiological information. The expected result is Negative. Fact Sheet for Patients:  PinkCheek.be Fact Sheet for Healthcare Providers:  GravelBags.it This test is not yet ap proved or cleared by the [place] FDA and  has been authorized for detection and/or diagnosis of SARS-CoV-2 by FDA under an Emergency Use Authorization (EUA). This EUA will remain  in effect (meaning this test can be used) for the duration of the COVID-19 declaration under Section 564(b)(1) of the Act, 21 U.S.C. section 360bbb-3(b)(1), unless the authorization is terminated or revoked sooner.    Influenza A by PCR NEGATIVE NEGATIVE Final   Influenza B by PCR NEGATIVE NEGATIVE Final    Comment:  (NOTE) The Xpert Xpress SARS-CoV-2/FLU/RSV assay is intended as an aid in  the diagnosis of influenza from Nasopharyngeal swab specimens and  should not be used as a sole basis for treatment. Nasal washings and  aspirates are unacceptable for Xpert Xpress SARS-CoV-2/FLU/RSV  testing. Fact Sheet for Patients: PinkCheek.be Fact Sheet for Healthcare Providers: GravelBags.it This test is not yet approved or cleared by the [place] FDA and  has been authorized for detection and/or diagnosis of SARS-CoV-2 by  FDA under an Emergency Use Authorization (EUA). This EUA will remain  in effect (meaning this test can be used) for the duration of the  Covid-19 declaration under Section 564(b)(1) of the Act, 21  U.S.C. section 360bbb-3(b)(1), unless the authorization is  terminated or revoked. Performed at [facility], [place] [address] [place], [place] [address]     Procedures and diagnostic studies:  DG Chest 1 View  Result Date: 11/30/2019 CLINICAL DATA:  Altered mental status. EXAM: CHEST  1 VIEW COMPARISON:  11/29/2019.  CT 08/22/2010. FINDINGS: Mediastinum hilar structures normal. Mild left base subsegmental atelectasis and or scarring again noted. No acute infiltrate. No pleural effusion or pneumothorax. Degenerative change thoracic spine. IMPRESSION: No acute cardiopulmonary disease. Mild left base subsegmental atelectasis and or scarring again noted. Electronically Signed   By: Marcello Moores  Register   On: 11/30/2019 16:45   CT HEAD WO CONTRAST  Result Date: 11/30/2019 CLINICAL DATA:  Fall from wheelchair EXAM: CT HEAD WITHOUT CONTRAST TECHNIQUE: Contiguous axial images were obtained from the base of the skull through the vertex without intravenous contrast. COMPARISON:  11/28/2019 FINDINGS: Brain: No evidence of acute infarction, hemorrhage, hydrocephalus, extra-axial collection or mass lesion/mass effect. Periventricular white  matter hypodensity. Vascular: No hyperdense vessel or unexpected calcification. Skull: Normal. Negative for fracture or focal lesion. Sinuses/Orbits: No acute finding. Other: None. IMPRESSION: No acute intracranial pathology.  Small-vessel white matter disease.  Electronically Signed   By: Eddie Candle M.D.   On: 11/30/2019 09:41   DG Chest Port 1 View  Result Date: 11/29/2019 CLINICAL DATA:  [place] 1 week ago, decline in function and mental status since EXAM: PORTABLE CHEST 1 VIEW COMPARISON:  Portable exam 1620 hours compared to 01/31/2018 at FINDINGS: Normal heart size, mediastinal contours, and pulmonary vascularity. Lungs clear. No pulmonary infiltrate, pleural effusion or pneumothorax. Bones demineralized. IMPRESSION: No acute abnormalities. Electronically Signed   By: Lavonia Dana M.D.   On: 11/29/2019 16:33    Medications:   . amLODipine  5 mg Oral Daily  . cycloSPORINE  125 mg Oral Daily  . cycloSPORINE  150 mg Oral QHS  . divalproex  500 mg Oral QHS  . doxazosin  1 mg Oral QHS  . fenofibrate  160 mg Oral Daily  . heparin injection (subcutaneous)  5,000 Units Subcutaneous Q8H  . levothyroxine  50 mcg Oral Q0600  . magnesium oxide  400 mg Oral Daily  . megestrol  20 mg Oral Daily  . metoprolol tartrate  100 mg Oral BID  . mycophenolate  250 mg Oral BID  . perphenazine  8 mg Oral QHS   Continuous Infusions: . ampicillin (OMNIPEN) IV 2 g (12/01/19 1016)     LOS: 1 day   Geradine Girt  [facility]   How to contact the [facility] Attending or Consulting provider [place] or covering provider during after hours [place], for this patient?  1. Check the care team in [facility] and look for a) attending/consulting TRH provider listed and b) the [place] team listed 2. Log into [email or web address] and use [place] universal password to access. If you do not have the password, please contact the hospital operator. 3. Locate the [facility] provider you are looking for under [facility] and page to a  number that you can be directly reached. 4. If you still have difficulty reaching the provider, please page the [facility] (Director on Call) for the Hospitalists listed on amion for assistance.  12/01/2019, 12:38 PM

## 2019-12-01 NOTE — Plan of Care (Signed)
  Problem: Education: Goal: Knowledge of General Education information will improve Description: Including pain rating scale, medication(s)/side effects and non-pharmacologic comfort measures Outcome: Progressing   Problem: Health Behavior/Discharge Planning: Goal: Ability to manage health-related needs will improve Outcome: Progressing   Problem: Clinical Measurements: Goal: Ability to maintain clinical measurements within normal limits will improve Outcome: Progressing Goal: Will remain free from infection Outcome: Progressing   Problem: Activity: Goal: Risk for activity intolerance will decrease Outcome: Progressing   Problem: Nutrition: Goal: Adequate nutrition will be maintained Outcome: Progressing   Problem: Coping: Goal: Level of anxiety will decrease Outcome: Progressing   Problem: Pain Managment: Goal: General experience of comfort will improve Outcome: Progressing   Problem: Safety: Goal: Ability to remain free from injury will improve Outcome: Progressing

## 2019-12-02 DIAGNOSIS — Z940 Kidney transplant status: Secondary | ICD-10-CM

## 2019-12-02 LAB — CBC WITH DIFFERENTIAL/PLATELET
Abs Immature Granulocytes: 0.09 10*3/uL — ABNORMAL HIGH (ref 0.00–0.07)
Basophils Absolute: 0 10*3/uL (ref 0.0–0.1)
Basophils Relative: 0 %
Eosinophils Absolute: 0.1 10*3/uL (ref 0.0–0.5)
Eosinophils Relative: 1 %
HCT: 36.1 % (ref 36.0–46.0)
Hemoglobin: 12.4 g/dL (ref 12.0–15.0)
Immature Granulocytes: 1 %
Lymphocytes Relative: 37 %
Lymphs Abs: 2.5 10*3/uL (ref 0.7–4.0)
MCH: 30.6 pg (ref 26.0–34.0)
MCHC: 34.3 g/dL (ref 30.0–36.0)
MCV: 89.1 fL (ref 80.0–100.0)
Monocytes Absolute: 0.9 10*3/uL (ref 0.1–1.0)
Monocytes Relative: 13 %
Neutro Abs: 3.2 10*3/uL (ref 1.7–7.7)
Neutrophils Relative %: 48 %
Platelets: 187 10*3/uL (ref 150–400)
RBC: 4.05 MIL/uL (ref 3.87–5.11)
RDW: 12 % (ref 11.5–15.5)
WBC: 6.7 10*3/uL (ref 4.0–10.5)
nRBC: 0 % (ref 0.0–0.2)

## 2019-12-02 NOTE — Progress Notes (Addendum)
CSW spoke with patient's son Remo Lipps to verify that patient is from [facility]. Remo Lipps verified that she is a resident and the plan would be for her to return upon discharge.   PT is recommending SNF. CSW attempted to call facility however unable to reach anyone.

## 2019-12-02 NOTE — TOC Progression Note (Signed)
Transition of Care [facility]) - Progression Note    Patient Details  Name: Brenda Nolan MRN: [id number] Date of Birth: 25-Dec-1950  Transition of Care [facility]) CM/SW [place], LCSW Phone Number: 12/02/2019, 12:43 PM  Clinical Narrative:    CSW spoke with [place], Therapist, sports at [facility]. She reports that patient likely needs rehab due to her being independent with them prior to her fall. CSW will complete SNF referral and provide bed offers and Medicare ratings to patient's son as available. Pasrr is pending due to psych diagnoses.    Expected Discharge Plan: [place] Barriers to Discharge: Continued Medical Work up, SNF Pending bed offer  Expected Discharge Plan and Services Expected Discharge Plan: [place] In-house Referral: Clinical Social Work   Post Acute Care Choice: [place] Living arrangements for the past 2 months: [place]                                       Social Determinants of Health (SDOH) Interventions    Readmission Risk Interventions No flowsheet data found.

## 2019-12-02 NOTE — Plan of Care (Signed)
  Problem: Education: Goal: Knowledge of General Education information will improve Description: Including pain rating scale, medication(s)/side effects and non-pharmacologic comfort measures Outcome: Progressing   Problem: Clinical Measurements: Goal: Will remain free from infection Outcome: Progressing   Problem: Activity: Goal: Risk for activity intolerance will decrease Outcome: Progressing   Problem: Coping: Goal: Level of anxiety will decrease Outcome: Progressing   Problem: Pain Managment: Goal: General experience of comfort will improve Outcome: Progressing   Problem: Skin Integrity: Goal: Risk for impaired skin integrity will decrease Outcome: Progressing   Problem: Safety: Goal: Ability to remain free from injury will improve Outcome: Progressing   

## 2019-12-02 NOTE — NC FL2 (Addendum)
[place] LEVEL OF CARE SCREENING TOOL     IDENTIFICATION  Patient Name: Brenda Nolan Birthdate: [birth year] Sex: female Admission Date (Current Location): 11/29/2019  [facility] and [place] Number:  Herbalist and Address:  [facility], [place] [address] [place], [place] [address]      Provider Number: [id number]  Attending Physician Name and Address:  Geradine Girt, DO  Relative Name and Phone Number:  Remo Lipps, son, [phone]    Current Level of Care: Hospital Recommended Level of Care: [place] Prior Approval Number:    Date Approved/Denied:   PASRR Number: pending, under review  Discharge Plan: SNF    Current Diagnoses: Patient Active Problem List   Diagnosis Date Noted  . AMS (altered mental status) 11/30/2019  . Altered mental status 11/29/2019  . Kidney transplant status   . Stage 3b chronic kidney disease   . Essential hypertension   . Bipolar 1 disorder ([place]) 2019  . Hypothyroidism 2019    Orientation RESPIRATION BLADDER Height & Weight     Self, Place  Normal Incontinent, External catheter Weight: 178 lb 9.2 oz (81 kg) Height:  5\' 7"  (170.2 cm)  BEHAVIORAL SYMPTOMS/MOOD NEUROLOGICAL BOWEL NUTRITION STATUS      Continent Diet(Please see DC Summary)  AMBULATORY STATUS COMMUNICATION OF NEEDS Skin   Limited Assist Verbally Normal                       Personal Care Assistance Level of Assistance  Bathing, Feeding, Dressing Bathing Assistance: Maximum assistance Feeding assistance: Independent Dressing Assistance: Limited assistance     Functional Limitations Info  Sight, Hearing, Speech Sight Info: Adequate Hearing Info: Adequate Speech Info: Adequate    SPECIAL CARE FACTORS FREQUENCY  PT (By licensed PT), OT (By licensed OT)     PT Frequency: 5x/week OT Frequency: 4x/week            Contractures Contractures Info: Not present    Additional Factors Info  Code  Status, Allergies, Psychotropic, Isolation Precautions Code Status Info: Full Allergies Info: Atorvastatin, Ketoconazole, Abilify (Aripiprazole), Ibuprofen, Levaquin (Levofloxacin), Sulfa Antibiotics, Sulfamethoxazole Psychotropic Info: Depakote   Isolation Precautions Info: 11/28/19 +VRE urine culture.     Current Medications (12/02/2019):  This is the current hospital active medication list Current Facility-Administered Medications  Medication Dose Route Frequency Provider Last Rate Last Admin  . acetaminophen (TYLENOL) tablet 650 mg  650 mg Oral Q6H PRN Reubin Milan, MD       Or  . acetaminophen (TYLENOL) suppository 650 mg  650 mg Rectal Q6H PRN Reubin Milan, MD      . amLODipine [facility]) tablet 5 mg  5 mg Oral Daily Reubin Milan, MD   5 mg at 12/02/19 1005  . ampicillin (OMNIPEN) 2 g in sodium chloride 0.9 % 100 mL IVPB  2 g Intravenous Q8H Vann, Jessica U, DO 300 mL/hr at 12/02/19 1015 2 g at 12/02/19 1015  . clonazePAM (KLONOPIN) tablet 1 mg  1 mg Oral Daily PRN Reubin Milan, MD   1 mg at 12/02/19 0014  . cycloSPORINE (SANDIMMUNE) capsule 125 mg  125 mg Oral Daily Reubin Milan, MD   125 mg at 12/02/19 1005  . cycloSPORINE (SANDIMMUNE) capsule 150 mg  150 mg Oral QHS Reubin Milan, MD   150 mg at 12/01/19 2235  . divalproex (DEPAKOTE ER) 24 hr tablet 500 mg  500 mg Oral QHS Olevia Bowens,  Gerri Lins, MD   500 mg at 12/01/19 2233  . doxazosin (CARDURA) tablet 1 mg  1 mg Oral QHS Reubin Milan, MD   1 mg at 12/01/19 2233  . fenofibrate tablet 160 mg  160 mg Oral Daily Reubin Milan, MD   160 mg at 12/02/19 1005  . heparin injection 5,000 Units  5,000 Units Subcutaneous Q8H Eulogio Bear U, DO   5,000 Units at 12/02/19 0557  . levothyroxine (SYNTHROID) tablet 50 mcg  50 mcg Oral Q0600 Reubin Milan, MD   50 mcg at 12/02/19 0557  . magnesium oxide (MAG-OX) tablet 400 mg  400 mg Oral Daily Reubin Milan, MD   400 mg at 12/02/19 1005   . megestrol (MEGACE) tablet 20 mg  20 mg Oral Daily Reubin Milan, MD   20 mg at 12/02/19 1005  . metoprolol tartrate (LOPRESSOR) tablet 100 mg  100 mg Oral BID Reubin Milan, MD   100 mg at 12/02/19 1005  . mycophenolate (CELLCEPT) capsule 250 mg  250 mg Oral BID Reubin Milan, MD   250 mg at 12/02/19 1005  . ondansetron (ZOFRAN) tablet 4 mg  4 mg Oral Q6H PRN Reubin Milan, MD       Or  . ondansetron [facility]) injection 4 mg  4 mg Intravenous Q6H PRN Reubin Milan, MD      . perphenazine (TRILAFON) tablet 8 mg  8 mg Oral QHS Reubin Milan, MD   8 mg at 12/01/19 2235  . sodium chloride (OCEAN) 0.65 % nasal spray 1 spray  1 spray Each Nare PRN Geradine Girt, DO         Discharge Medications: Please see discharge summary for a list of discharge medications.  Relevant Imaging Results:  Relevant Lab Results:   Additional Information SSN: [id number]     COVID negative on 11/29/19  Benard Halsted, LCSW

## 2019-12-02 NOTE — Progress Notes (Signed)
  Speech Language Pathology Treatment: Dysphagia  Patient Details Name: Brenda Nolan MRN: [id number] DOB: 07-Sep-1951 Today's Date: 12/02/2019 Time: 4098-1191 SLP Time Calculation (min) (ACUTE ONLY): 13 min  Assessment / Plan / Recommendation Clinical Impression  F/u diet tolerance assessment s/p evaluation of swallowing 1/15 complete. Patient remains impulsive with po intake however receptive to verbal cueing, requiring moderate-max verbal cues during po intake to decrease bite size and limit intake to single sips. Even when challenged however with large bite size and multiple consecutive large sips of thin liquid via straw, patient without overt indication of aspiration and was able to maintain normal O2 saturation levels today. RN confirmed that she has not observed any desaturations during po intake as were noted 1/15. Suspect that desaturations are related to activity rather than aspiration. No further skilled SLP needs indicated at this time as patient appears to be tolerating diet. Dysphagia 3 solids remain appropriate given impulsivity. Would continue to recommend full supervision however as patient long term likely to benefit from cueing for slowed rate of intake/small bites and sips to decrease aspiration risk.     HPI HPI: Pt is a 69 yo female presenting after a fall from her WC with AMS. The pt had been reccently admitted and d/c from the ED (1/12) following a fall with head trauma, the pt returned to the same SNF, and then was readmitted 1/13 following a second fall from her WC with no head trauma. The pt  PMH is sig for anxiety, bipolar 1 disorders, stage IIIb CKD,, history of C. difficile colitis, hypothyroidism, kidney transplant status on immunosuppressants, and history of UTI.  Hx of oropharyngeal dysphagia mentioned in physician HPI; however, no further elaboration and unable to find additional documentation.        SLP Plan  All goals met       Recommendations  Diet  recommendations: Dysphagia 3 (mechanical soft);Thin liquid Liquids provided via: Cup;Straw Medication Administration: Whole meds with liquid Supervision: Patient able to self feed;Full supervision/cueing for compensatory strategies Compensations: Slow rate;Small sips/bites;Minimize environmental distractions Postural Changes and/or Swallow Maneuvers: Seated upright 90 degrees                Oral Care Recommendations: Oral care BID Follow up Recommendations: None(for dysphagia) SLP Visit Diagnosis: Dysphagia, unspecified (R13.10) Plan: All goals met       GO             Gabriel Rainwater MA, CCC-SLP     Brenda Nolan 12/02/2019, 8:44 AM

## 2019-12-02 NOTE — Progress Notes (Signed)
Progress Note    Brenda Nolan  [id number] DOB: [birth year]  DOA: 11/29/2019 PCP: Patient, No Pcp Per    Brief Narrative:     Medical records reviewed and are as summarized below:  Brenda Nolan is an 69 y.o. female with medical history significant of anxiety, bipolar 1 disorders, stage IIIb chronic kidney disease, unspecified difficulty walking history, oropharyngeal phase dysphagia, history of C. difficile colitis, hypothyroidism, muscle weakness, kidney transplant status on immunosuppressants, history of UTI episodes associated with AMS who is brought to the emergency department due to decreasing mentation in the past 3 days.  Facility reported a fall with head trauma about a week ago.  She was brought to the ER on (11/27/2019) Monday evening and had a CT scan that was negative.  She was seen by behavioral health who recommended an overnight observation in the ED and she was later discharged yesterday morning.  However, she returns today after sliding out of her wheelchair.  She is oriented to name, she knows she is in the hospital, but disoriented to date and recent events.  She is unable to provide further history.  Assessment/Plan:   Principal Problem:   Altered mental status Active Problems:   Bipolar 1 disorder (HCC)   Hypothyroidism   Kidney transplant status   Stage 3b chronic kidney disease   Essential hypertension   AMS (altered mental status)     Altered mental status due to UTI Culture with VRE -IV PCN -per staff: her baseline is living in an ALF but able to dress self and carry on a conversation- per son, not all put together; does have tremors at baseline     Bipolar 1 disorder (HCC) Continue Depakote ER 500 mg at bedtime.. Continue clonazepam as needed. -on trilafon  -has some tardive dyskinesia but has been worse over last few days at ALF prior to presentation at hospital  Hypoxia with eating -seen by SLP -needs smaller bites and more control -no sign  of aspiration    Hypothyroidism Continue levothyroxine. -tsh normal    Kidney transplant status -Continue cyclosporine and mycophenolate. -Monitor renal function    Stage 3b chronic kidney disease -trend    Essential hypertension Continue amlodipine 5 mg p.o. daily Continue Cardura 1 mg p.o. at bedtime.   Family Communication/Anticipated D/C date and plan/Code Status   DVT prophylaxis: heparin Code Status: Full Code.  Family Communication: called son-- I will call again on Sunday after he speaks to her on the phone to see if back to her baseline Disposition Plan: pending improvement of symptoms- -suspect Monday/Tuesday   Medical Consultants:    None.     Subjective:   Wants to get dressed  Objective:    Vitals:   12/01/19 1959 12/01/19 2357 12/02/19 0312 12/02/19 0809  BP: 125/87 (!) 145/94 131/86 127/82  Pulse: 83 80 75 100  Resp: (!) 25 (!) 25 (!) 24 17  Temp: 98.3 F (36.8 C) 98.7 F (37.1 C) 98 F (36.7 C) 98.3 F (36.8 C)  TempSrc: Oral Oral Oral Oral  SpO2: 100% 99% 100% 100%  Weight:      Height:        Intake/Output Summary (Last 24 hours) at 12/02/2019 1408 Last data filed at 12/01/2019 1500 Gross per 24 hour  Intake 100 ml  Output --  Net 100 ml   Filed Weights   11/30/19 0900  Weight: 81 kg    Exam: Focused on getting dressed Regular rate and rhythm No increased  work of breathing Speech fast and loud  Data Reviewed:   I have personally reviewed following labs and imaging studies:  Labs: Labs show the following:   Basic Metabolic Panel: Recent Labs  Lab 11/27/19 1654 11/27/19 1654 11/29/19 1640 11/30/19 0514  NA 141  --  136 137  K 3.9   < > 3.9 4.3  CL 109  --  104 109  CO2 22  --  18* 17*  GLUCOSE 103*  --  91 90  BUN 24*  --  23 23  CREATININE 1.39*  --  1.52* 1.49*  CALCIUM 9.7  --  10.1 8.9   < > = values in this interval not displayed.   GFR Estimated Creatinine Clearance: 40.1 mL/min (A) (by C-G  formula based on SCr of 1.49 mg/dL (H)). Liver Function Tests: Recent Labs  Lab 11/27/19 1654 11/29/19 1640 11/30/19 0514  AST 19 19 14*  ALT 10 11 8   ALKPHOS 26* 31* 23*  BILITOT 0.6 1.3* 0.8  PROT 7.0 7.1 5.5*  ALBUMIN 4.0 3.7 2.9*   No results for input(s): LIPASE, AMYLASE in the last 168 hours. Recent Labs  Lab 11/29/19 1640  AMMONIA 18   Coagulation profile Recent Labs  Lab 11/29/19 1852  INR 1.1    CBC: Recent Labs  Lab 11/27/19 1654 11/29/19 1640 11/30/19 0514 12/01/19 0247 12/02/19 0253  WBC 7.6 12.8* 9.4 6.6 6.7  NEUTROABS  --  9.2* 6.3 4.4 3.2  HGB 14.0 14.0 12.1 11.7* 12.4  HCT 42.2 41.4 36.0 34.5* 36.1  MCV 92.1 91.0 91.6 90.3 89.1  PLT 227 220 171 182 187   Cardiac Enzymes: No results for input(s): CKTOTAL, CKMB, CKMBINDEX, TROPONINI in the last 168 hours. BNP (last 3 results) No results for input(s): PROBNP in the last 8760 hours. CBG: Recent Labs  Lab 11/29/19 1621  GLUCAP 77   D-Dimer: No results for input(s): DDIMER in the last 72 hours. Hgb A1c: No results for input(s): HGBA1C in the last 72 hours. Lipid Profile: No results for input(s): CHOL, HDL, LDLCALC, TRIG, CHOLHDL, LDLDIRECT in the last 72 hours. Thyroid function studies: Recent Labs    11/30/19 1045  TSH 1.818   Anemia work up: No results for input(s): VITAMINB12, FOLATE, FERRITIN, TIBC, IRON, RETICCTPCT in the last 72 hours. Sepsis Labs: Recent Labs  Lab 11/27/19 1654 11/27/19 1654 11/29/19 1640 11/30/19 0514 12/01/19 0247 12/02/19 0253  PROCALCITON  --   --  1.07  --   --   --   WBC 7.6   < > 12.8* 9.4 6.6 6.7  LATICACIDVEN 1.2  --  1.2  --   --   --    < > = values in this interval not displayed.    Microbiology Recent Results (from the past 240 hour(s))  Respiratory Panel by RT PCR (Flu A&B, Covid) - Nasopharyngeal Swab     Status: None   Collection Time: 11/27/19  4:56 PM   Specimen: Nasopharyngeal Swab  Result Value Ref Range Status   SARS  Coronavirus 2 by RT PCR NEGATIVE NEGATIVE Final    Comment: (NOTE) SARS-CoV-2 target nucleic acids are NOT DETECTED. The SARS-CoV-2 RNA is generally detectable in upper respiratoy specimens during the acute phase of infection. The lowest concentration of SARS-CoV-2 viral copies this assay can detect is 131 copies/mL. A negative result does not preclude SARS-Cov-2 infection and should not be used as the sole basis for treatment or other patient management decisions. A negative result may  occur with  improper specimen collection/handling, submission of specimen other than nasopharyngeal swab, presence of viral mutation(s) within the areas targeted by this assay, and inadequate number of viral copies (<131 copies/mL). A negative result must be combined with clinical observations, patient history, and epidemiological information. The expected result is Negative. Fact Sheet for Patients:  PinkCheek.be Fact Sheet for Healthcare Providers:  GravelBags.it This test is not yet ap proved or cleared by the [place] FDA and  has been authorized for detection and/or diagnosis of SARS-CoV-2 by FDA under an Emergency Use Authorization (EUA). This EUA will remain  in effect (meaning this test can be used) for the duration of the COVID-19 declaration under Section 564(b)(1) of the Act, 21 U.S.C. section 360bbb-3(b)(1), unless the authorization is terminated or revoked sooner.    Influenza A by PCR NEGATIVE NEGATIVE Final   Influenza B by PCR NEGATIVE NEGATIVE Final    Comment: (NOTE) The Xpert Xpress SARS-CoV-2/FLU/RSV assay is intended as an aid in  the diagnosis of influenza from Nasopharyngeal swab specimens and  should not be used as a sole basis for treatment. Nasal washings and  aspirates are unacceptable for Xpert Xpress SARS-CoV-2/FLU/RSV  testing. Fact Sheet for Patients: PinkCheek.be Fact Sheet  for Healthcare Providers: GravelBags.it This test is not yet approved or cleared by the [place] FDA and  has been authorized for detection and/or diagnosis of SARS-CoV-2 by  FDA under an Emergency Use Authorization (EUA). This EUA will remain  in effect (meaning this test can be used) for the duration of the  Covid-19 declaration under Section 564(b)(1) of the Act, 21  U.S.C. section 360bbb-3(b)(1), unless the authorization is  terminated or revoked. Performed at [facility], [place] [address], [place], [place] [address]   Blood Cultures (routine x 2)     Status: None (Preliminary result)   Collection Time: 11/29/19  4:40 PM   Specimen: BLOOD  Result Value Ref Range Status   Specimen Description BLOOD LEFT ANTECUBITAL  Final   Special Requests   Final    BOTTLES DRAWN AEROBIC AND ANAEROBIC Blood Culture adequate volume   Culture   Final    NO GROWTH 3 DAYS Performed at [facility], [place] [address], [place], [place] [address]    Report Status PENDING  Incomplete  Urine culture     Status: Abnormal   Collection Time: 11/29/19  5:02 PM   Specimen: In/Out Cath Urine  Result Value Ref Range Status   Specimen Description IN/OUT CATH URINE  Final   Special Requests   Final    NONE Performed at [facility], [place] [address] [place],  [address]    Culture (A)  Final    >=100,000 COLONIES/mL VANCOMYCIN RESISTANT ENTEROCOCCUS   Report Status 12/01/2019 FINAL  Final   Organism ID, Bacteria VANCOMYCIN RESISTANT ENTEROCOCCUS (A)  Final      Susceptibility   Vancomycin resistant enterococcus - MIC*    AMPICILLIN <=2 SENSITIVE Sensitive     NITROFURANTOIN <=16 SENSITIVE Sensitive     VANCOMYCIN >=32 RESISTANT Resistant     LINEZOLID 2 SENSITIVE Sensitive     * >=100,000 COLONIES/mL VANCOMYCIN RESISTANT ENTEROCOCCUS  Blood Cultures (routine x 2)     Status: None (Preliminary result)   Collection Time: 11/29/19  6:51 PM     Specimen: BLOOD RIGHT HAND  Result Value Ref Range Status   Specimen Description BLOOD RIGHT HAND  Final   Special Requests   Final  BOTTLES DRAWN AEROBIC AND ANAEROBIC Blood Culture adequate volume   Culture   Final    NO GROWTH 3 DAYS Performed at [facility], [place] [address] [place], [place] [address]    Report Status PENDING  Incomplete  Respiratory Panel by RT PCR (Flu A&B, Covid) - Nasopharyngeal Swab     Status: None   Collection Time: 11/29/19  7:10 PM   Specimen: Nasopharyngeal Swab  Result Value Ref Range Status   SARS Coronavirus 2 by RT PCR NEGATIVE NEGATIVE Final    Comment: (NOTE) SARS-CoV-2 target nucleic acids are NOT DETECTED. The SARS-CoV-2 RNA is generally detectable in upper respiratoy specimens during the acute phase of infection. The lowest concentration of SARS-CoV-2 viral copies this assay can detect is 131 copies/mL. A negative result does not preclude SARS-Cov-2 infection and should not be used as the sole basis for treatment or other patient management decisions. A negative result may occur with  improper specimen collection/handling, submission of specimen other than nasopharyngeal swab, presence of viral mutation(s) within the areas targeted by this assay, and inadequate number of viral copies (<131 copies/mL). A negative result must be combined with clinical observations, patient history, and epidemiological information. The expected result is Negative. Fact Sheet for Patients:  PinkCheek.be Fact Sheet for Healthcare Providers:  GravelBags.it This test is not yet ap proved or cleared by the [place] FDA and  has been authorized for detection and/or diagnosis of SARS-CoV-2 by FDA under an Emergency Use Authorization (EUA). This EUA will remain  in effect (meaning this test can be used) for the duration of the COVID-19 declaration under Section 564(b)(1) of the Act, 21  U.S.C. section 360bbb-3(b)(1), unless the authorization is terminated or revoked sooner.    Influenza A by PCR NEGATIVE NEGATIVE Final   Influenza B by PCR NEGATIVE NEGATIVE Final    Comment: (NOTE) The Xpert Xpress SARS-CoV-2/FLU/RSV assay is intended as an aid in  the diagnosis of influenza from Nasopharyngeal swab specimens and  should not be used as a sole basis for treatment. Nasal washings and  aspirates are unacceptable for Xpert Xpress SARS-CoV-2/FLU/RSV  testing. Fact Sheet for Patients: PinkCheek.be Fact Sheet for Healthcare Providers: GravelBags.it This test is not yet approved or cleared by the [place] FDA and  has been authorized for detection and/or diagnosis of SARS-CoV-2 by  FDA under an Emergency Use Authorization (EUA). This EUA will remain  in effect (meaning this test can be used) for the duration of the  Covid-19 declaration under Section 564(b)(1) of the Act, 21  U.S.C. section 360bbb-3(b)(1), unless the authorization is  terminated or revoked. Performed at [facility], [place] [address] [place],  [address]     Procedures and diagnostic studies:  DG Chest 1 View  Result Date: 11/30/2019 CLINICAL DATA:  Altered mental status. EXAM: CHEST  1 VIEW COMPARISON:  11/29/2019.  CT 08/22/2010. FINDINGS: Mediastinum hilar structures normal. Mild left base subsegmental atelectasis and or scarring again noted. No acute infiltrate. No pleural effusion or pneumothorax. Degenerative change thoracic spine. IMPRESSION: No acute cardiopulmonary disease. Mild left base subsegmental atelectasis and or scarring again noted. Electronically Signed   By: [place]   On: 11/30/2019 16:45    Medications:   . amLODipine  5 mg Oral Daily  . cycloSPORINE  125 mg Oral Daily  . cycloSPORINE  150 mg Oral QHS  . divalproex  500 mg Oral QHS  . doxazosin  1 mg Oral QHS  . fenofibrate  160 mg Oral Daily  .  heparin injection (subcutaneous)  5,000 Units Subcutaneous Q8H  . levothyroxine  50 mcg Oral Q0600  . magnesium oxide  400 mg Oral Daily  . megestrol  20 mg Oral Daily  . metoprolol tartrate  100 mg Oral BID  . mycophenolate  250 mg Oral BID  . perphenazine  8 mg Oral QHS   Continuous Infusions: . ampicillin (OMNIPEN) IV 2 g (12/02/19 1015)     LOS: 2 days   Geradine Girt  [facility]   How to contact the [facility] Attending or Consulting provider [place] or covering provider during after hours [place], for this patient?  1. Check the care team in [facility] and look for a) attending/consulting TR[facility] provider listed and b) the [facility] team listed 2. Log into [email or web address] and use [place]'s universal password to access. If you do not have the password, please contact the hospital operator. 3. Locate the [facility] provider you are looking for under [facility] and page to a number that you can be directly reached. 4. If you still have difficulty reaching the provider, please page the [facility] (Director on Call) for the Hospitalists listed on amion for assistance.  12/02/2019, 2:08 PM

## 2019-12-03 ENCOUNTER — Encounter (HOSPITAL_COMMUNITY): Payer: Self-pay | Admitting: Internal Medicine

## 2019-12-03 LAB — BASIC METABOLIC PANEL
Anion gap: 9 (ref 5–15)
BUN: 18 mg/dL (ref 8–23)
CO2: 19 mmol/L — ABNORMAL LOW (ref 22–32)
Calcium: 9.8 mg/dL (ref 8.9–10.3)
Chloride: 112 mmol/L — ABNORMAL HIGH (ref 98–111)
Creatinine, Ser: 1.28 mg/dL — ABNORMAL HIGH (ref 0.44–1.00)
GFR calc Af Amer: 50 mL/min — ABNORMAL LOW (ref >60)
GFR calc non Af Amer: 43 mL/min — ABNORMAL LOW (ref >60)
Glucose, Bld: 84 mg/dL (ref 70–99)
Potassium: 4.7 mmol/L (ref 3.5–5.1)
Sodium: 140 mmol/L (ref 135–145)

## 2019-12-03 LAB — CBC WITH DIFFERENTIAL/PLATELET
Abs Immature Granulocytes: 0.06 10*3/uL (ref 0.00–0.07)
Basophils Absolute: 0 10*3/uL (ref 0.0–0.1)
Basophils Relative: 0 %
Eosinophils Absolute: 0 10*3/uL (ref 0.0–0.5)
Eosinophils Relative: 1 %
HCT: 38 % (ref 36.0–46.0)
Hemoglobin: 12.9 g/dL (ref 12.0–15.0)
Immature Granulocytes: 1 %
Lymphocytes Relative: 41 %
Lymphs Abs: 2.4 10*3/uL (ref 0.7–4.0)
MCH: 30.7 pg (ref 26.0–34.0)
MCHC: 33.9 g/dL (ref 30.0–36.0)
MCV: 90.5 fL (ref 80.0–100.0)
Monocytes Absolute: 0.5 10*3/uL (ref 0.1–1.0)
Monocytes Relative: 9 %
Neutro Abs: 2.8 10*3/uL (ref 1.7–7.7)
Neutrophils Relative %: 48 %
Platelets: 216 10*3/uL (ref 150–400)
RBC: 4.2 MIL/uL (ref 3.87–5.11)
RDW: 12.1 % (ref 11.5–15.5)
WBC: 5.8 10*3/uL (ref 4.0–10.5)
nRBC: 0 % (ref 0.0–0.2)

## 2019-12-03 MED ORDER — AMOXICILLIN 500 MG PO CAPS
500.0000 mg | ORAL_CAPSULE | Freq: Three times a day (TID) | ORAL | Status: DC
  Administered 2019-12-03 – 2019-12-06 (×10): 500 mg via ORAL
  Filled 2019-12-03 (×11): qty 1

## 2019-12-03 MED ORDER — WHITE PETROLATUM EX OINT
TOPICAL_OINTMENT | CUTANEOUS | Status: AC
  Filled 2019-12-03: qty 28.35

## 2019-12-03 NOTE — Progress Notes (Signed)
Patient's chart reviewed, consult placed for medication recommendation for her bipolar d/o.  Stabilized prior to admission on Depakote 500 mg daily at bedtime, Trilafon 8 mg daily at bedtime, and Klonopin 1 mg daily PRN anxiety/agitation.  Recommend continuing these medications as she was stable on this regiment.  Every thing was restarted except the Depakote.  Recommend restarting it.  Waylan Boga, PMHNP

## 2019-12-03 NOTE — Plan of Care (Signed)

## 2019-12-03 NOTE — Progress Notes (Signed)
Progress Note    Brenda Nolan  [id number] DOB: 01/21/1951  DOA: 11/29/2019 PCP: Patient, No Pcp Per    Brief Narrative:     Medical records reviewed and are as summarized below:  Brenda Nolan is an 69 y.o. female with medical history significant of anxiety, bipolar 1 disorders, stage IIIb chronic kidney disease, unspecified difficulty walking history, oropharyngeal phase dysphagia, history of C. difficile colitis, hypothyroidism, muscle weakness, kidney transplant status on immunosuppressants, history of UTI episodes associated with AMS who is brought to the emergency department due to decreasing mentation in the past 3 days.  Facility reported a fall with head trauma about a week ago.  She was brought to the ER on (11/27/2019) Monday evening and had a CT scan that was negative.  She was seen by behavioral health who recommended an overnight observation in the ED and she was later discharged yesterday morning.  However, she returns today after sliding out of her wheelchair.  She is oriented to name, she knows she is in the hospital, but disoriented to date and recent events.  She is unable to provide further history.  Assessment/Plan:   Principal Problem:   Altered mental status Active Problems:   Bipolar 1 disorder (HCC)   Hypothyroidism   Kidney transplant status   Stage 3b chronic kidney disease   Essential hypertension   AMS (altered mental status)     Altered mental status due to UTI Culture with VRE -IV PCN -per staff: her baseline is living in an ALF but able to dress self and carry on a conversation- per son, not all put together; does have tremors at baseline     Bipolar 1 disorder (HCC) Continue Depakote ER 500 mg at bedtime.. Continue clonazepam as needed. -on trilafon  -has some tardive dyskinesia but has been worse over last few days at ALF prior to presentation at hospital Will get psych consult as I wonder if some of her chronic medications need to be  changed  Hypoxia with eating -seen by SLP -needs smaller bites and more control -no sign of aspiration    Hypothyroidism Continue levothyroxine. -tsh normal    Kidney transplant status -Continue cyclosporine and mycophenolate. -Monitor renal function    Stage 3b chronic kidney disease -trend    Essential hypertension Continue amlodipine 5 mg p.o. daily Continue Cardura 1 mg p.o. at bedtime.   Family Communication/Anticipated D/C date and plan/Code Status   DVT prophylaxis: heparin Code Status: Full Code.  Family Communication: spoke to son 1/17 Disposition Plan: pending improvement of symptoms- -suspect Monday/Tuesday   Medical Consultants:   psych    Subjective:   Wants to go home  Objective:    Vitals:   12/02/19 1603 12/02/19 1929 12/03/19 0400 12/03/19 0812  BP: (!) 141/79 135/81 128/62 (!) 121/97  Pulse: 76 83 82 79  Resp: 17 19 14 18   Temp: (!) 97.5 F (36.4 C) 98 F (36.7 C) 98.2 F (36.8 C) 99.1 F (37.3 C)  TempSrc: Oral Oral Oral Oral  SpO2: 100% 100% 99% 99%  Weight:      Height:        Intake/Output Summary (Last 24 hours) at 12/03/2019 1247 Last data filed at 12/03/2019 0900 Gross per 24 hour  Intake 240 ml  Output 2300 ml  Net -2060 ml   Filed Weights   11/30/19 0900  Weight: 81 kg    Exam: Sleepy this AM but will wake up and has pressured speech Odd affect Less  tremulous and less movements similar to tardive dyskinesia   Data Reviewed:   I have personally reviewed following labs and imaging studies:  Labs: Labs show the following:   Basic Metabolic Panel: Recent Labs  Lab 11/27/19 1654 11/27/19 1654 11/29/19 1640 11/29/19 1640 11/30/19 0514 12/03/19 0735  NA 141  --  136  --  137 140  K 3.9   < > 3.9   < > 4.3 4.7  CL 109  --  104  --  109 112*  CO2 22  --  18*  --  17* 19*  GLUCOSE 103*  --  91  --  90 84  BUN 24*  --  23  --  23 18  CREATININE 1.39*  --  1.52*  --  1.49* 1.28*  CALCIUM 9.7  --  10.1   --  8.9 9.8   < > = values in this interval not displayed.   GFR Estimated Creatinine Clearance: 46.7 mL/min (A) (by C-G formula based on SCr of 1.28 mg/dL (H)). Liver Function Tests: Recent Labs  Lab 11/27/19 1654 11/29/19 1640 11/30/19 0514  AST 19 19 14*  ALT 10 11 8   ALKPHOS 26* 31* 23*  BILITOT 0.6 1.3* 0.8  PROT 7.0 7.1 5.5*  ALBUMIN 4.0 3.7 2.9*   No results for input(s): LIPASE, AMYLASE in the last 168 hours. Recent Labs  Lab 11/29/19 1640  AMMONIA 18   Coagulation profile Recent Labs  Lab 11/29/19 1852  INR 1.1    CBC: Recent Labs  Lab 11/29/19 1640 11/30/19 0514 12/01/19 0247 12/02/19 0253 12/03/19 0735  WBC 12.8* 9.4 6.6 6.7 5.8  NEUTROABS 9.2* 6.3 4.4 3.2 2.8  HGB 14.0 12.1 11.7* 12.4 12.9  HCT 41.4 36.0 34.5* 36.1 38.0  MCV 91.0 91.6 90.3 89.1 90.5  PLT 220 171 182 187 216   Cardiac Enzymes: No results for input(s): CKTOTAL, CKMB, CKMBINDEX, TROPONINI in the last 168 hours. BNP (last 3 results) No results for input(s): PROBNP in the last 8760 hours. CBG: Recent Labs  Lab 11/29/19 1621  GLUCAP 77   D-Dimer: No results for input(s): DDIMER in the last 72 hours. Hgb A1c: No results for input(s): HGBA1C in the last 72 hours. Lipid Profile: No results for input(s): CHOL, HDL, LDLCALC, TRIG, CHOLHDL, LDLDIRECT in the last 72 hours. Thyroid function studies: No results for input(s): TSH, T4TOTAL, T3FREE, THYROIDAB in the last 72 hours.  Invalid input(s): FREET3 Anemia work up: No results for input(s): VITAMINB12, FOLATE, FERRITIN, TIBC, IRON, RETICCTPCT in the last 72 hours. Sepsis Labs: Recent Labs  Lab 11/27/19 1654 11/27/19 1654 11/29/19 1640 11/29/19 1640 11/30/19 0514 12/01/19 0247 12/02/19 0253 12/03/19 0735  PROCALCITON  --   --  1.07  --   --   --   --   --   WBC 7.6   < > 12.8*   < > 9.4 6.6 6.7 5.8  LATICACIDVEN 1.2  --  1.2  --   --   --   --   --    < > = values in this interval not displayed.     Microbiology Recent Results (from the past 240 hour(s))  Respiratory Panel by RT PCR (Flu A&B, Covid) - Nasopharyngeal Swab     Status: None   Collection Time: 11/27/19  4:56 PM   Specimen: Nasopharyngeal Swab  Result Value Ref Range Status   SARS Coronavirus 2 by RT PCR NEGATIVE NEGATIVE Final    Comment: (NOTE) SARS-CoV-2 target  nucleic acids are NOT DETECTED. The SARS-CoV-2 RNA is generally detectable in upper respiratoy specimens during the acute phase of infection. The lowest concentration of SARS-CoV-2 viral copies this assay can detect is 131 copies/mL. A negative result does not preclude SARS-Cov-2 infection and should not be used as the sole basis for treatment or other patient management decisions. A negative result may occur with  improper specimen collection/handling, submission of specimen other than nasopharyngeal swab, presence of viral mutation(s) within the areas targeted by this assay, and inadequate number of viral copies (<131 copies/mL). A negative result must be combined with clinical observations, patient history, and epidemiological information. The expected result is Negative. Fact Sheet for Patients:  PinkCheek.be Fact Sheet for Healthcare Providers:  GravelBags.it This test is not yet ap proved or cleared by the [place] FDA and  has been authorized for detection and/or diagnosis of SARS-CoV-2 by FDA under an Emergency Use Authorization (EUA). This EUA will remain  in effect (meaning this test can be used) for the duration of the COVID-19 declaration under Section 564(b)(1) of the Act, 21 U.S.C. section 360bbb-3(b)(1), unless the authorization is terminated or revoked sooner.    Influenza A by PCR NEGATIVE NEGATIVE Final   Influenza B by PCR NEGATIVE NEGATIVE Final    Comment: (NOTE) The Xpert Xpress SARS-CoV-2/FLU/RSV assay is intended as an aid in  the diagnosis of influenza from  Nasopharyngeal swab specimens and  should not be used as a sole basis for treatment. Nasal washings and  aspirates are unacceptable for Xpert Xpress SARS-CoV-2/FLU/RSV  testing. Fact Sheet for Patients: PinkCheek.be Fact Sheet for Healthcare Providers: GravelBags.it This test is not yet approved or cleared by the [place] FDA and  has been authorized for detection and/or diagnosis of SARS-CoV-2 by  FDA under an Emergency Use Authorization (EUA). This EUA will remain  in effect (meaning this test can be used) for the duration of the  Covid-19 declaration under Section 564(b)(1) of the Act, 21  U.S.C. section 360bbb-3(b)(1), unless the authorization is  terminated or revoked. Performed at [facility], [place] [address] [place] [address] [place], [place] [address]   Blood Cultures (routine x 2)     Status: None (Preliminary result)   Collection Time: 11/29/19  4:40 PM   Specimen: BLOOD  Result Value Ref Range Status   Specimen Description BLOOD LEFT ANTECUBITAL  Final   Special Requests   Final    BOTTLES DRAWN AEROBIC AND ANAEROBIC Blood Culture adequate volume   Culture   Final    NO GROWTH 4 DAYS Performed at [facility], [place] [address], [place], [place] [address]    Report Status PENDING  Incomplete  Urine culture     Status: Abnormal   Collection Time: 11/29/19  5:02 PM   Specimen: In/Out Cath Urine  Result Value Ref Range Status   Specimen Description IN/OUT CATH URINE  Final   Special Requests   Final    NONE Performed at [facility], [place] [address] [place], [place] [address]    Culture (A)  Final    >=100,000 COLONIES/mL VANCOMYCIN RESISTANT ENTEROCOCCUS   Report Status 12/01/2019 FINAL  Final   Organism ID, Bacteria VANCOMYCIN RESISTANT ENTEROCOCCUS (A)  Final      Susceptibility   Vancomycin resistant enterococcus - MIC*    AMPICILLIN <=2 SENSITIVE Sensitive     NITROFURANTOIN <=16  SENSITIVE Sensitive     VANCOMYCIN >=32 RESISTANT Resistant     LINEZOLID 2 SENSITIVE  Sensitive     * >=100,000 COLONIES/mL VANCOMYCIN RESISTANT ENTEROCOCCUS  Blood Cultures (routine x 2)     Status: None (Preliminary result)   Collection Time: 11/29/19  6:51 PM   Specimen: BLOOD RIGHT HAND  Result Value Ref Range Status   Specimen Description BLOOD RIGHT HAND  Final   Special Requests   Final    BOTTLES DRAWN AEROBIC AND ANAEROBIC Blood Culture adequate volume   Culture   Final    NO GROWTH 4 DAYS Performed at [facility], [place] [address], [place], [place] [address]    Report Status PENDING  Incomplete  Respiratory Panel by RT PCR (Flu A&B, Covid) - Nasopharyngeal Swab     Status: None   Collection Time: 11/29/19  7:10 PM   Specimen: Nasopharyngeal Swab  Result Value Ref Range Status   SARS Coronavirus 2 by RT PCR NEGATIVE NEGATIVE Final    Comment: (NOTE) SARS-CoV-2 target nucleic acids are NOT DETECTED. The SARS-CoV-2 RNA is generally detectable in upper respiratoy specimens during the acute phase of infection. The lowest concentration of SARS-CoV-2 viral copies this assay can detect is 131 copies/mL. A negative result does not preclude SARS-Cov-2 infection and should not be used as the sole basis for treatment or other patient management decisions. A negative result may occur with  improper specimen collection/handling, submission of specimen other than nasopharyngeal swab, presence of viral mutation(s) within the areas targeted by this assay, and inadequate number of viral copies (<131 copies/mL). A negative result must be combined with clinical observations, patient history, and epidemiological information. The expected result is Negative. Fact Sheet for Patients:  PinkCheek.be Fact Sheet for Healthcare Providers:  GravelBags.it This test is not yet ap proved or cleared by the [place] FDA and  has been  authorized for detection and/or diagnosis of SARS-CoV-2 by FDA under an Emergency Use Authorization (EUA). This EUA will remain  in effect (meaning this test can be used) for the duration of the COVID-19 declaration under Section 564(b)(1) of the Act, 21 U.S.C. section 360bbb-3(b)(1), unless the authorization is terminated or revoked sooner.    Influenza A by PCR NEGATIVE NEGATIVE Final   Influenza B by PCR NEGATIVE NEGATIVE Final    Comment: (NOTE) The Xpert Xpress SARS-CoV-2/FLU/RSV assay is intended as an aid in  the diagnosis of influenza from Nasopharyngeal swab specimens and  should not be used as a sole basis for treatment. Nasal washings and  aspirates are unacceptable for Xpert Xpress SARS-CoV-2/FLU/RSV  testing. Fact Sheet for Patients: PinkCheek.be Fact Sheet for Healthcare Providers: GravelBags.it This test is not yet approved or cleared by the [place] FDA and  has been authorized for detection and/or diagnosis of SARS-CoV-2 by  FDA under an Emergency Use Authorization (EUA). This EUA will remain  in effect (meaning this test can be used) for the duration of the  Covid-19 declaration under Section 564(b)(1) of the Act, 21  U.S.C. section 360bbb-3(b)(1), unless the authorization is  terminated or revoked. Performed at [facility], [place] [address] [place], [place] [address]     Procedures and diagnostic studies:  No results found.  Medications:   . amLODipine  5 mg Oral Daily  . amoxicillin  500 mg Oral Q8H  . cycloSPORINE  125 mg Oral Daily  . cycloSPORINE  150 mg Oral QHS  . divalproex  500 mg Oral QHS  . doxazosin  1 mg Oral QHS  . fenofibrate  160 mg Oral Daily  .  heparin injection (subcutaneous)  5,000 Units Subcutaneous Q8H  . levothyroxine  50 mcg Oral Q0600  . magnesium oxide  400 mg Oral Daily  . megestrol  20 mg Oral Daily  . metoprolol tartrate  100 mg Oral BID  . mycophenolate   250 mg Oral BID  . perphenazine  8 mg Oral QHS   Continuous Infusions:    LOS: 3 days   Geradine Girt  [facility]   How to contact the [facility] Attending or Consulting provider [place] or covering provider during after hours [place], for this patient?  1. Check the care team in [facility] and look for a) attending/consulting [facility] provider listed and b) the [facility] team listed 2. Log into [email or web address] and use [employer] universal password to access. If you do not have the password, please contact the hospital operator. 3. Locate the [facility] provider you are looking for under [facility] and page to a number that you can be directly reached. 4. If you still have difficulty reaching the provider, please page the [facility] (Director on Call) for the Hospitalists listed on amion for assistance.  12/03/2019, 12:47 PM

## 2019-12-04 DIAGNOSIS — R410 Disorientation, unspecified: Secondary | ICD-10-CM

## 2019-12-04 DIAGNOSIS — G92 Toxic encephalopathy: Secondary | ICD-10-CM

## 2019-12-04 LAB — CULTURE, BLOOD (ROUTINE X 2)
Culture: NO GROWTH
Culture: NO GROWTH
Special Requests: ADEQUATE
Special Requests: ADEQUATE

## 2019-12-04 LAB — CBC WITH DIFFERENTIAL/PLATELET
Abs Immature Granulocytes: 0.12 10*3/uL — ABNORMAL HIGH (ref 0.00–0.07)
Basophils Absolute: 0 10*3/uL (ref 0.0–0.1)
Basophils Relative: 0 %
Eosinophils Absolute: 0 10*3/uL (ref 0.0–0.5)
Eosinophils Relative: 1 %
HCT: 40.9 % (ref 36.0–46.0)
Hemoglobin: 13.6 g/dL (ref 12.0–15.0)
Immature Granulocytes: 2 %
Lymphocytes Relative: 35 %
Lymphs Abs: 2.7 10*3/uL (ref 0.7–4.0)
MCH: 30.4 pg (ref 26.0–34.0)
MCHC: 33.3 g/dL (ref 30.0–36.0)
MCV: 91.3 fL (ref 80.0–100.0)
Monocytes Absolute: 0.6 10*3/uL (ref 0.1–1.0)
Monocytes Relative: 9 %
Neutro Abs: 4.1 10*3/uL (ref 1.7–7.7)
Neutrophils Relative %: 53 %
Platelets: 217 10*3/uL (ref 150–400)
RBC: 4.48 MIL/uL (ref 3.87–5.11)
RDW: 11.9 % (ref 11.5–15.5)
WBC: 7.6 10*3/uL (ref 4.0–10.5)
nRBC: 0 % (ref 0.0–0.2)

## 2019-12-04 MED ORDER — SENNOSIDES-DOCUSATE SODIUM 8.6-50 MG PO TABS
1.0000 | ORAL_TABLET | Freq: Every evening | ORAL | Status: DC | PRN
  Administered 2019-12-04: 2 via ORAL
  Filled 2019-12-04: qty 2

## 2019-12-04 MED ORDER — OLANZAPINE 5 MG PO TABS
2.5000 mg | ORAL_TABLET | Freq: Every day | ORAL | Status: DC
  Administered 2019-12-04 – 2019-12-06 (×3): 2.5 mg via ORAL
  Filled 2019-12-04 (×3): qty 1

## 2019-12-04 MED ORDER — OLANZAPINE 5 MG PO TABS
5.0000 mg | ORAL_TABLET | Freq: Every day | ORAL | Status: DC
  Administered 2019-12-04 – 2019-12-05 (×2): 5 mg via ORAL
  Filled 2019-12-04 (×2): qty 1

## 2019-12-04 NOTE — Progress Notes (Signed)
Progress Note    Brenda Nolan  [id number] DOB: [birth year]  DOA: 11/29/2019 PCP: Patient, No Pcp Per    Brief Narrative:     Medical records reviewed and are as summarized below:  Brenda Nolan is an 69 y.o. female with medical history significant of anxiety, bipolar 1 disorders, stage IIIb chronic kidney disease, unspecified difficulty walking history, oropharyngeal phase dysphagia, history of C. difficile colitis, hypothyroidism, muscle weakness, kidney transplant status on immunosuppressants, history of UTI episodes associated with AMS who is brought to the emergency department due to decreasing mentation in the past 3 days.  Facility reported a fall with head trauma about a week ago.  She was brought to the ER on (11/27/2019) Monday evening and had a CT scan that was negative.  She was seen by behavioral health who recommended an overnight observation in the ED and she was later discharged yesterday morning.  However, she returns today after sliding out of her wheelchair.  She is oriented to name, she knows she is in the hospital, but disoriented to date and recent events.  She is unable to provide further history.  Assessment/Plan:   Principal Problem:   Altered mental status Active Problems:   Bipolar 1 disorder (HCC)   Hypothyroidism   Kidney transplant status   Stage 3b chronic kidney disease   Essential hypertension   AMS (altered mental status)     Altered mental status due to UTI Culture with VRE -IV PCN changed to PO pcn -per staff: her baseline is living in an ALF but able to dress self and carry on a conversation- per son, not all put together; does have tremors at baseline     Bipolar 1 disorder (HCC) Continue Depakote ER 500 mg at bedtime.. Continue clonazepam as needed. -on trilafon  -has some tardive dyskinesia but has been worse over last few days at ALF prior to presentation at hospital -psych consult appreciated-- will monitor with new medication  changes  Hypoxia with eating -seen by SLP -needs smaller bites and more control -no sign of aspiration    Hypothyroidism Continue levothyroxine. -tsh normal    Kidney transplant status -Continue cyclosporine and mycophenolate. -Monitor renal function    Stage 3b chronic kidney disease -trend    Essential hypertension Continue amlodipine 5 mg p.o. daily Continue Cardura 1 mg p.o. at bedtime.   Family Communication/Anticipated D/C date and plan/Code Status   DVT prophylaxis: heparin Code Status: Full Code.  Family Communication: spoke to son 1/17 Disposition Plan: SNF placement pending   Medical Consultants:   psych    Subjective:   Says she wants to go to the hospital  Objective:    Vitals:   12/03/19 2012 12/03/19 2016 12/04/19 0357 12/04/19 0759  BP: 128/81  126/79 131/75  Pulse: 80  78 96  Resp: 19  20 18   Temp: 98.6 F (37 C)  98.3 F (36.8 C) 98.3 F (36.8 C)  TempSrc:   Oral Oral  SpO2: (!) 84% 99% 99% 100%  Weight:      Height:        Intake/Output Summary (Last 24 hours) at 12/04/2019 1102 Last data filed at 12/04/2019 0900 Gross per 24 hour  Intake 720 ml  Output 1400 ml  Net -680 ml   Filed Weights   11/30/19 0900  Weight: 81 kg    Exam: Agitated rrr No increased work of breathing More tremulous  Data Reviewed:   I have personally reviewed following labs and imaging  studies:  Labs: Labs show the following:   Basic Metabolic Panel: Recent Labs  Lab 11/27/19 1654 11/27/19 1654 11/29/19 1640 11/29/19 1640 11/30/19 0514 12/03/19 0735  NA 141  --  136  --  137 140  K 3.9   < > 3.9   < > 4.3 4.7  CL 109  --  104  --  109 112*  CO2 22  --  18*  --  17* 19*  GLUCOSE 103*  --  91  --  90 84  BUN 24*  --  23  --  23 18  CREATININE 1.39*  --  1.52*  --  1.49* 1.28*  CALCIUM 9.7  --  10.1  --  8.9 9.8   < > = values in this interval not displayed.   GFR Estimated Creatinine Clearance: 46.7 mL/min (A) (by C-G  formula based on SCr of 1.28 mg/dL (H)). Liver Function Tests: Recent Labs  Lab 11/27/19 1654 11/29/19 1640 11/30/19 0514  AST 19 19 14*  ALT 10 11 8   ALKPHOS 26* 31* 23*  BILITOT 0.6 1.3* 0.8  PROT 7.0 7.1 5.5*  ALBUMIN 4.0 3.7 2.9*   No results for input(s): LIPASE, AMYLASE in the last 168 hours. Recent Labs  Lab 11/29/19 1640  AMMONIA 18   Coagulation profile Recent Labs  Lab 11/29/19 1852  INR 1.1    CBC: Recent Labs  Lab 11/30/19 0514 12/01/19 0247 12/02/19 0253 12/03/19 0735 12/04/19 0527  WBC 9.4 6.6 6.7 5.8 7.6  NEUTROABS 6.3 4.4 3.2 2.8 4.1  HGB 12.1 11.7* 12.4 12.9 13.6  HCT 36.0 34.5* 36.1 38.0 40.9  MCV 91.6 90.3 89.1 90.5 91.3  PLT 171 182 187 216 217   Cardiac Enzymes: No results for input(s): CKTOTAL, CKMB, CKMBINDEX, TROPONINI in the last 168 hours. BNP (last 3 results) No results for input(s): PROBNP in the last 8760 hours. CBG: Recent Labs  Lab 11/29/19 1621  GLUCAP 77   D-Dimer: No results for input(s): DDIMER in the last 72 hours. Hgb A1c: No results for input(s): HGBA1C in the last 72 hours. Lipid Profile: No results for input(s): CHOL, HDL, LDLCALC, TRIG, CHOLHDL, LDLDIRECT in the last 72 hours. Thyroid function studies: No results for input(s): TSH, T4TOTAL, T3FREE, THYROIDAB in the last 72 hours.  Invalid input(s): FREET3 Anemia work up: No results for input(s): VITAMINB12, FOLATE, FERRITIN, TIBC, IRON, RETICCTPCT in the last 72 hours. Sepsis Labs: Recent Labs  Lab 11/27/19 1654 11/27/19 1654 11/29/19 1640 11/30/19 0514 12/01/19 0247 12/02/19 0253 12/03/19 0735 12/04/19 0527  PROCALCITON  --   --  1.07  --   --   --   --   --   WBC 7.6   < > 12.8*   < > 6.6 6.7 5.8 7.6  LATICACIDVEN 1.2  --  1.2  --   --   --   --   --    < > = values in this interval not displayed.    Microbiology Recent Results (from the past 240 hour(s))  Respiratory Panel by RT PCR (Flu A&B, Covid) - Nasopharyngeal Swab     Status: None    Collection Time: 11/27/19  4:56 PM   Specimen: Nasopharyngeal Swab  Result Value Ref Range Status   SARS Coronavirus 2 by RT PCR NEGATIVE NEGATIVE Final    Comment: (NOTE) SARS-CoV-2 target nucleic acids are NOT DETECTED. The SARS-CoV-2 RNA is generally detectable in upper respiratoy specimens during the acute phase of infection. The  lowest concentration of SARS-CoV-2 viral copies this assay can detect is 131 copies/mL. A negative result does not preclude SARS-Cov-2 infection and should not be used as the sole basis for treatment or other patient management decisions. A negative result may occur with  improper specimen collection/handling, submission of specimen other than nasopharyngeal swab, presence of viral mutation(s) within the areas targeted by this assay, and inadequate number of viral copies (<131 copies/mL). A negative result must be combined with clinical observations, patient history, and epidemiological information. The expected result is Negative. Fact Sheet for Patients:  PinkCheek.be Fact Sheet for Healthcare Providers:  GravelBags.it This test is not yet ap proved or cleared by the [place] FDA and  has been authorized for detection and/or diagnosis of SARS-CoV-2 by FDA under an Emergency Use Authorization (EUA). This EUA will remain  in effect (meaning this test can be used) for the duration of the COVID-19 declaration under Section 564(b)(1) of the Act, 21 U.S.C. section 360bbb-3(b)(1), unless the authorization is terminated or revoked sooner.    Influenza A by PCR NEGATIVE NEGATIVE Final   Influenza B by PCR NEGATIVE NEGATIVE Final    Comment: (NOTE) The Xpert Xpress SARS-CoV-2/FLU/RSV assay is intended as an aid in  the diagnosis of influenza from Nasopharyngeal swab specimens and  should not be used as a sole basis for treatment. Nasal washings and  aspirates are unacceptable for Xpert Xpress  SARS-CoV-2/FLU/RSV  testing. Fact Sheet for Patients: PinkCheek.be Fact Sheet for Healthcare Providers: GravelBags.it This test is not yet approved or cleared by the [place] FDA and  has been authorized for detection and/or diagnosis of SARS-CoV-2 by  FDA under an Emergency Use Authorization (EUA). This EUA will remain  in effect (meaning this test can be used) for the duration of the  Covid-19 declaration under Section 564(b)(1) of the Act, 21  U.S.C. section 360bbb-3(b)(1), unless the authorization is  terminated or revoked. Performed at [facility], [place] [address] [place], [place] [address]   Blood Cultures (routine x 2)     Status: None   Collection Time: 11/29/19  4:40 PM   Specimen: BLOOD  Result Value Ref Range Status   Specimen Description BLOOD LEFT ANTECUBITAL  Final   Special Requests   Final    BOTTLES DRAWN AEROBIC AND ANAEROBIC Blood Culture adequate volume   Culture   Final    NO GROWTH 5 DAYS Performed at [facility], [place] [address] [place], [place] [address]    Report Status 12/04/2019 FINAL  Final  Urine culture     Status: Abnormal   Collection Time: 11/29/19  5:02 PM   Specimen: In/Out Cath Urine  Result Value Ref Range Status   Specimen Description IN/OUT CATH URINE  Final   Special Requests   Final    NONE Performed at [facility], [place] [address], [place], [place] [address]    Culture (A)  Final    >=100,000 COLONIES/mL VANCOMYCIN RESISTANT ENTEROCOCCUS   Report Status 12/01/2019 FINAL  Final   Organism ID, Bacteria VANCOMYCIN RESISTANT ENTEROCOCCUS (A)  Final      Susceptibility   Vancomycin resistant enterococcus - MIC*    AMPICILLIN <=2 SENSITIVE Sensitive     NITROFURANTOIN <=16 SENSITIVE Sensitive     VANCOMYCIN >=32 RESISTANT Resistant     LINEZOLID 2 SENSITIVE Sensitive     * >=100,000 COLONIES/mL VANCOMYCIN RESISTANT ENTEROCOCCUS  Blood  Cultures (routine x 2)     Status: None  Collection Time: 11/29/19  6:51 PM   Specimen: BLOOD RIGHT HAND  Result Value Ref Range Status   Specimen Description BLOOD RIGHT HAND  Final   Special Requests   Final    BOTTLES DRAWN AEROBIC AND ANAEROBIC Blood Culture adequate volume   Culture   Final    NO GROWTH 5 DAYS Performed at [facility], [address] [place], [place] [address]    Report Status 12/04/2019 FINAL  Final  Respiratory Panel by RT PCR (Flu A&B, Covid) - Nasopharyngeal Swab     Status: None   Collection Time: 11/29/19  7:10 PM   Specimen: Nasopharyngeal Swab  Result Value Ref Range Status   SARS Coronavirus 2 by RT PCR NEGATIVE NEGATIVE Final    Comment: (NOTE) SARS-CoV-2 target nucleic acids are NOT DETECTED. The SARS-CoV-2 RNA is generally detectable in upper respiratoy specimens during the acute phase of infection. The lowest concentration of SARS-CoV-2 viral copies this assay can detect is 131 copies/mL. A negative result does not preclude SARS-Cov-2 infection and should not be used as the sole basis for treatment or other patient management decisions. A negative result may occur with  improper specimen collection/handling, submission of specimen other than nasopharyngeal swab, presence of viral mutation(s) within the areas targeted by this assay, and inadequate number of viral copies (<131 copies/mL). A negative result must be combined with clinical observations, patient history, and epidemiological information. The expected result is Negative. Fact Sheet for Patients:  PinkCheek.be Fact Sheet for Healthcare Providers:  GravelBags.it This test is not yet ap proved or cleared by the [place] FDA and  has been authorized for detection and/or diagnosis of SARS-CoV-2 by FDA under an Emergency Use Authorization (EUA). This EUA will remain  in effect (meaning this test can be used) for the  duration of the COVID-19 declaration under Section 564(b)(1) of the Act, 21 U.S.C. section 360bbb-3(b)(1), unless the authorization is terminated or revoked sooner.    Influenza A by PCR NEGATIVE NEGATIVE Final   Influenza B by PCR NEGATIVE NEGATIVE Final    Comment: (NOTE) The Xpert Xpress SARS-CoV-2/FLU/RSV assay is intended as an aid in  the diagnosis of influenza from Nasopharyngeal swab specimens and  should not be used as a sole basis for treatment. Nasal washings and  aspirates are unacceptable for Xpert Xpress SARS-CoV-2/FLU/RSV  testing. Fact Sheet for Patients: PinkCheek.be Fact Sheet for Healthcare Providers: GravelBags.it This test is not yet approved or cleared by the [place] FDA and  has been authorized for detection and/or diagnosis of SARS-CoV-2 by  FDA under an Emergency Use Authorization (EUA). This EUA will remain  in effect (meaning this test can be used) for the duration of the  Covid-19 declaration under Section 564(b)(1) of the Act, 21  U.S.C. section 360bbb-3(b)(1), unless the authorization is  terminated or revoked. Performed at [facility], [place] [address] [place], [place] [address]     Procedures and diagnostic studies:  No results found.  Medications:   . amLODipine  5 mg Oral Daily  . amoxicillin  500 mg Oral Q8H  . cycloSPORINE  125 mg Oral Daily  . cycloSPORINE  150 mg Oral QHS  . divalproex  500 mg Oral QHS  . doxazosin  1 mg Oral QHS  . fenofibrate  160 mg Oral Daily  . heparin injection (subcutaneous)  5,000 Units Subcutaneous Q8H  . levothyroxine  50 mcg Oral Q0600  . magnesium oxide  400 mg Oral Daily  .  megestrol  20 mg Oral Daily  . metoprolol tartrate  100 mg Oral BID  . mycophenolate  250 mg Oral BID  . OLANZapine  2.5 mg Oral Daily  . OLANZapine  5 mg Oral QHS   Continuous Infusions:    LOS: 4 days   Geradine Girt  [facility]   How to  contact the [facility] Attending or Consulting provider [place] or covering provider during after hours [place], for this patient?  1. Check the care team in [facility] and look for a) attending/consulting TR[facility] provider listed and b) the [facility] team listed 2. Log into [email or web address] and use [place]'s universal password to access. If you do not have the password, please contact the hospital operator. 3. Locate the [facility] provider you are looking for under [facility] and page to a number that you can be directly reached. 4. If you still have difficulty reaching the provider, please page the [facility] (Director on Call) for the Hospitalists listed on amion for assistance.  12/04/2019, 11:02 AM

## 2019-12-04 NOTE — Plan of Care (Signed)

## 2019-12-04 NOTE — NC FL2 (Signed)
[place] LEVEL OF CARE SCREENING TOOL     IDENTIFICATION  Patient Name: Brenda Nolan Birthdate: January 21, 1951 Sex: female Admission Date (Current Location): 11/29/2019  [facility] and [place] Number:  Herbalist and Address:  [facility] [place], [place] [address] [place], [place] [address]      Provider Number: [id number]  Attending Physician Name and Address:  Geradine Girt, DO  Relative Name and Phone Number:  Remo Lipps, son, [phone]    Current Level of Care: Hospital Recommended Level of Care: [place] Prior Approval Number:    Date Approved/Denied:   PASRR Number: pending, under review  Discharge Plan: SNF    Current Diagnoses: Patient Active Problem List   Diagnosis Date Noted  . AMS (altered mental status) 11/30/2019  . Altered mental status 11/29/2019  . Kidney transplant status   . Stage 3b chronic kidney disease   . Essential hypertension   . Bipolar 1 disorder ([place]) 2019  . Hypothyroidism 2019    Orientation RESPIRATION BLADDER Height & Weight     Self, Place  Normal Incontinent, External catheter Weight: 178 lb 9.2 oz (81 kg) Height:  5\' 7"  (170.2 cm)  BEHAVIORAL SYMPTOMS/MOOD NEUROLOGICAL BOWEL NUTRITION STATUS      Continent Diet(Please see DC Summary)  AMBULATORY STATUS COMMUNICATION OF NEEDS Skin   Limited Assist Verbally Normal                       Personal Care Assistance Level of Assistance  Bathing, Feeding, Dressing Bathing Assistance: Maximum assistance Feeding assistance: Independent Dressing Assistance: Limited assistance     Functional Limitations Info  Sight, Hearing, Speech Sight Info: Adequate Hearing Info: Adequate Speech Info: Adequate    SPECIAL CARE FACTORS FREQUENCY  PT (By licensed PT), OT (By licensed OT)     PT Frequency: 5x/week OT Frequency: 4x/week            Contractures Contractures Info: Not present    Additional Factors Info  Code  Status, Allergies, Psychotropic, Isolation Precautions Code Status Info: Full Allergies Info: Atorvastatin, Ketoconazole, Abilify (Aripiprazole), Ibuprofen, Levaquin (Levofloxacin), Sulfa Antibiotics, Sulfamethoxazole Psychotropic Info: Depakote   Isolation Precautions Info: 11/28/19 +VRE urine culture.     Current Medications (12/04/2019):  This is the current hospital active medication list Current Facility-Administered Medications  Medication Dose Route Frequency Provider Last Rate Last Admin  . acetaminophen (TYLENOL) tablet 650 mg  650 mg Oral Q6H PRN Reubin Milan, MD       Or  . acetaminophen (TYLENOL) suppository 650 mg  650 mg Rectal Q6H PRN Reubin Milan, MD      . amLODipine [facility]) tablet 5 mg  5 mg Oral Daily Reubin Milan, MD   5 mg at 12/04/19 0943  . amoxicillin (AMOXIL) capsule 500 mg  500 mg Oral Q8H Vann, Jessica U, DO   500 mg at 12/04/19 [id number]  . clonazePAM (KLONOPIN) tablet 1 mg  1 mg Oral Daily PRN Reubin Milan, MD   1 mg at 12/04/19 [id number]  . cycloSPORINE (SANDIMMUNE) capsule 125 mg  125 mg Oral Daily Reubin Milan, MD   125 mg at 12/04/19 0944  . cycloSPORINE (SANDIMMUNE) capsule 150 mg  150 mg Oral QHS Reubin Milan, MD   150 mg at 12/03/19 2243  . divalproex (DEPAKOTE ER) 24 hr tablet 500 mg  500 mg Oral QHS Reubin Milan, MD   500 mg at 12/03/19 2105  .  doxazosin (CARDURA) tablet 1 mg  1 mg Oral QHS Reubin Milan, MD   1 mg at 12/03/19 2106  . fenofibrate tablet 160 mg  160 mg Oral Daily Reubin Milan, MD   160 mg at 12/04/19 0944  . heparin injection 5,000 Units  5,000 Units Subcutaneous Q8H Eulogio Bear U, DO   5,000 Units at 12/04/19 [id number]  . levothyroxine (SYNTHROID) tablet 50 mcg  50 mcg Oral Q0600 Reubin Milan, MD   50 mcg at 12/04/19 [id number]  . magnesium oxide (MAG-OX) tablet 400 mg  400 mg Oral Daily Reubin Milan, MD   400 mg at 12/04/19 0943  . megestrol (MEGACE) tablet 20 mg  20 mg Oral Daily  Reubin Milan, MD   20 mg at 12/04/19 0943  . metoprolol tartrate (LOPRESSOR) tablet 100 mg  100 mg Oral BID Reubin Milan, MD   100 mg at 12/04/19 0943  . mycophenolate (CELLCEPT) capsule 250 mg  250 mg Oral BID Reubin Milan, MD   250 mg at 12/04/19 0946  . OLANZapine (ZYPREXA) tablet 2.5 mg  2.5 mg Oral Daily Lord, Jamison Y, NP      . OLANZapine (ZYPREXA) tablet 5 mg  5 mg Oral QHS Patrecia Pour, NP      . ondansetron [facility]) tablet 4 mg  4 mg Oral Q6H PRN Reubin Milan, MD       Or  . ondansetron [facility]) injection 4 mg  4 mg Intravenous Q6H PRN Reubin Milan, MD      . senna-docusate (Senokot-S) tablet 1-2 tablet  1-2 tablet Oral QHS PRN Vertis Kelch, NP   2 tablet at 12/04/19 0245  . sodium chloride (OCEAN) 0.65 % nasal spray 1 spray  1 spray Each Nare PRN Geradine Girt, DO         Discharge Medications: Please see discharge summary for a list of discharge medications.  Relevant Imaging Results:  Relevant Lab Results:   Additional Information ss# [id number] Will require 30 days of skilled services.  Atilano Median, LCSW

## 2019-12-04 NOTE — Progress Notes (Signed)
Physical Therapy Treatment Patient Details Name: Brenda Nolan MRN: [id number] DOB: Oct 11, 1951 Today's Date: 12/04/2019    History of Present Illness Pt is a 69 yo female presenting after a fall from her WC with AMS. The pt had been reccently admitted and d/c from the ED (1/12) following a fall with head trauma, the pt returned to the same SNF, and then was readmitted 1/13 following a second fall from her WC with no head trauma. The pt  PMH is sig for anxiety, bipolar 1 disorders, stage IIIb CKD, oropharyngeal phase dysphagia, history of C. difficile colitis, hypothyroidism, kidney transplant status on immunosuppressants, and history of UTI.    PT Comments    Pt in bed upon PT arrival, agreeable to PT session focused on progressing safety with mobility and independence. The pt was able to demo improvements in ambulation distance and stability this session, but continues to have sig limitations in safety due to cog limitations and baseline impairments. The pt will continue to benefit from skilled PT with focus on progressing mobility with cognitive tasks to replicate real-life scenarios. The pt will benefit from short stint of rehab prior to d/c to ALF to return to prior level of independence.     Follow Up Recommendations  SNF;Supervision/Assistance - 24 hour     Equipment Recommendations  None recommended by PT(defer to post-acute)    Recommendations for Other Services       Precautions / Restrictions Precautions Precautions: Fall Precaution Comments: pt with multiple frequent falls and poor cognition Restrictions Weight Bearing Restrictions: No    Mobility  Bed Mobility Overal bed mobility: Modified Independent;Needs Assistance Bed Mobility: Sidelying to Sit;Supine to Sit   Sidelying to sit: Supervision Supine to sit: Supervision     General bed mobility comments: Pt able to move to sitting EOB and back to supine without assist, benefits from supervision for verbal cues for  sequencing  Transfers Overall transfer level: Needs assistance Equipment used: Rolling walker (2 wheeled) Transfers: Sit to/from Stand Sit to Stand: Min guard         General transfer comment: pt able to complete without assist, min guard for safety  Ambulation/Gait Ambulation/Gait assistance: Min guard Gait Distance (Feet): 15 Feet(15 x 2, pt declined ambulation in hall) Assistive device: Rolling walker (2 wheeled) Gait Pattern/deviations: Narrow base of support;Step-through pattern   Gait velocity interpretation: <1.31 ft/sec, indicative of household ambulator General Gait Details: Pt able to ambulate in room with min guard and use of RW for safety, no LOB, but pt decliend further ambulation due to concerns about "going to the hospital for ultrascan".   Stairs             Wheelchair Mobility    Modified Rankin (Stroke Patients Only)       Balance Overall balance assessment: Needs assistance Sitting-balance support: Bilateral upper extremity supported;Feet supported Sitting balance-Leahy Scale: Good Sitting balance - Comments: supervision     Standing balance-Leahy Scale: Poor Standing balance comment: pt benefits from UE support, safety risk due to poor cog understanding of safety                            Cognition Arousal/Alertness: Awake/alert Behavior During Therapy: Agitated;Restless Overall Cognitive Status: No family/caregiver present to determine baseline cognitive functioning(cog deficits at baseline, but unable to assess baseline as pt is poor historian) Area of Impairment: Attention;Safety/judgement;Problem solving  Orientation Level: Disoriented to;Situation;Time Current Attention Level: Focused     Safety/Judgement: Decreased awareness of safety;Decreased awareness of deficits   Problem Solving: Slow processing;Decreased initiation;Difficulty sequencing;Requires verbal cues General Comments: Pt was  cooperative and redirectable, but restless and agitated through eval due to desire to "go to the hospital" for an "ultrascan". The pt became more restless with mobility, but was able to complete mobility safety despite attention being focused soley on "going to the hospital" Pt reports she is aware she is at a hospital, but wants to go to [place] comments (skin integrity, edema, etc.): pt is declining all food/drink at this time reporting she cannot eat before her "ultrascan", pt informed she could eat if she was hungry.      Pertinent Vitals/Pain Pain Assessment: No/denies pain(Pt requesting she be taken to the hospital, but denies pain) Faces Pain Scale: Hurts a little bit Pain Intervention(s): Limited activity within patient's tolerance;Monitored during session;Repositioned    Home Living                      Prior Function            PT Goals (current goals can now be found in the care plan section) Acute Rehab PT Goals Patient Stated Goal: put on clothes PT Goal Formulation: With patient Time For Goal Achievement: 12/14/19 Potential to Achieve Goals: Good Progress towards PT goals: Progressing toward goals    Frequency    Min 2X/week      PT Plan Current plan remains appropriate    Co-evaluation              AM-PAC PT "6 Clicks" Mobility   Outcome Measure  Help needed turning from your back to your side while in a flat bed without using bedrails?: A Little Help needed moving from lying on your back to sitting on the side of a flat bed without using bedrails?: A Little Help needed moving to and from a bed to a chair (including a wheelchair)?: A Little Help needed standing up from a chair using your arms (e.g., wheelchair or bedside chair)?: A Little Help needed to walk in hospital room?: A Little Help needed climbing 3-5 steps with a railing? : A Lot 6 Click Score: 17    End of Session  Equipment Utilized During Treatment: Gait belt Activity Tolerance: Patient tolerated treatment well Patient left: in bed;with call bell/phone within reach;with bed alarm set(chair position) Nurse Communication: Mobility status PT Visit Diagnosis: Difficulty in walking, not elsewhere classified (R26.2);Unsteadiness on feet (R26.81);History of falling (Z91.81)     Time: [id number] PT Time Calculation (min) (ACUTE ONLY): 18 min  Charges:  $Gait Training: 8-22 mins                     Karma Ganja, PT, DPT   Acute Rehabilitation Department Pager #: [phone]   Otho Bellows 12/04/2019, 10:47 AM

## 2019-12-04 NOTE — Consult Note (Signed)
[facility] Consult   Reason for Consult:  Medication adjustments Referring Physician:  Dr Eliseo Squires Patient Identification: Brenda Nolan MRN:  [id number] Principal Diagnosis: Altered mental status Diagnosis:  Principal Problem:   Altered mental status Active Problems:   Bipolar 1 disorder ([place])   Hypothyroidism   Kidney transplant status   Stage 3b chronic kidney disease   Essential hypertension   AMS (altered mental status)   Total Time spent with patient: 1 hour  Subjective:   Brenda Nolan is a 69 y.o. female patient admitted with AMS r/t falls and recent head injury, history of bipolar d/o.  "I need an ultrascan!"  Patient seen and evaluated in person by this provider.  She was anxious on assessment and yelling on, "I need an ultra scan."  She she requested to be taken to the hospital for an ultra scan.  Reminded the patient that she was at the hospital but she did not comprehend this information.  Answer questions with no or yes, poor historian at this time.  She is disheveled and agitated on assessment with difficulty processing information.  Medication recommendations placed below.  HPI per MD:   Brenda Nolan is a 69 y.o. female with medical history significant of anxiety, bipolar 1 disorders, stage IIIb chronic kidney disease, unspecified difficulty walking history, oropharyngeal phase dysphagia, history of C. difficile colitis, hypothyroidism, muscle weakness, kidney transplant status on immunosuppressants, history of UTI episodes associated with AMS who is brought to the emergency department due to decreasing mentation in the past 3 days.  Facility reported a fall with head trauma about a week ago.  She was brought to the ER on (11/27/2019) Monday evening and had a CT scan that was negative.  She was seen by behavioral health who recommended an overnight observation in the ED and she was later discharged yesterday morning.  However, she returns today after sliding out of  her wheelchair.  She is oriented to name, she knows she is in the hospital, but disoriented to date and recent events.  She is unable to provide further history.  Past Psychiatric History: bipolar d/o  Risk to Self:  none Risk to Others:  none Prior Inpatient Therapy:  unknown Prior Outpatient Therapy:  yes  Past Medical History:  Past Medical History:  Diagnosis Date  . Anxiety 2019  . Bipolar 1 disorder ([place]) 2019  . Chronic kidney disease (CKD)    Stage 1  . Difficulty in walking, not elsewhere classified   . Dysphagia, oropharyngeal phase   . Enterocolitis due to Clostridioides difficile 2019  . Hypothyroidism 2019  . Kidney transplant status   . Muscle weakness   . Urinary tract infection    History reviewed. No pertinent surgical history. Family History: History reviewed. No pertinent family history. Family Psychiatric  History: none Social History:  Social History   Substance and Sexual Activity  Alcohol Use None     Social History   Substance and Sexual Activity  Drug Use Not on file    Social History   Socioeconomic History  . Marital status: Married    Spouse name: Not on file  . Number of children: Not on file  . Years of education: Not on file  . Highest education level: Not on file  Occupational History  . Not on file  Tobacco Use  . Smoking status: Never Smoker  . Smokeless tobacco: Never Used  Substance and Sexual Activity  . Alcohol use: Not on file  . Drug use:  Not on file  . Sexual activity: Not on file  Other Topics Concern  . Not on file  Social History Narrative  . Not on file   Social Determinants of Health   Financial Resource Strain:   . Difficulty of Paying Living Expenses: Not on file  Food Insecurity:   . Worried About Charity fundraiser in the Last Year: Not on file  . Ran Out of Food in the Last Year: Not on file  Transportation Needs:   . Lack of Transportation (Medical): Not on file  . Lack of Transportation  (Non-Medical): Not on file  Physical Activity:   . Days of Exercise per Week: Not on file  . Minutes of Exercise per Session: Not on file  Stress:   . Feeling of Stress : Not on file  Social Connections:   . Frequency of Communication with Friends and Family: Not on file  . Frequency of Social Gatherings with Friends and Family: Not on file  . Attends Religious Services: Not on file  . Active Member of Clubs or Organizations: Not on file  . Attends Archivist Meetings: Not on file  . Marital Status: Not on file   Additional Social History:    Allergies:   Allergies  Allergen Reactions  . Atorvastatin Other (See Comments)    Myalgias Myalgias   . Ketoconazole Rash  . Abilify [Aripiprazole]     unknown  . Ibuprofen     unknown  . Levaquin [Levofloxacin]     unknown  . Sulfa Antibiotics     unknown  . Sulfamethoxazole     unknown    Labs:  Results for orders placed or performed during the hospital encounter of 11/29/19 (from the past 48 hour(s))  CBC WITH DIFFERENTIAL     Status: None   Collection Time: 12/03/19  7:35 AM  Result Value Ref Range   WBC 5.8 4.0 - 10.5 K/uL   RBC 4.20 3.87 - 5.11 MIL/uL   Hemoglobin 12.9 12.0 - 15.0 g/dL   HCT 38.0 36.0 - 46.0 %   MCV 90.5 80.0 - 100.0 fL   MCH 30.7 26.0 - 34.0 pg   MCHC 33.9 30.0 - 36.0 g/dL   RDW 12.1 11.5 - 15.5 %   Platelets 216 150 - 400 K/uL   nRBC 0.0 0.0 - 0.2 %   Neutrophils Relative % 48 %   Neutro Abs 2.8 1.7 - 7.7 K/uL   Lymphocytes Relative 41 %   Lymphs Abs 2.4 0.7 - 4.0 K/uL   Monocytes Relative 9 %   Monocytes Absolute 0.5 0.1 - 1.0 K/uL   Eosinophils Relative 1 %   Eosinophils Absolute 0.0 0.0 - 0.5 K/uL   Basophils Relative 0 %   Basophils Absolute 0.0 0.0 - 0.1 K/uL   Immature Granulocytes 1 %   Abs Immature Granulocytes 0.06 0.00 - 0.07 K/uL    Comment: Performed at [facility], [address] [place], [place] [id number]  Basic metabolic panel     Status: Abnormal    Collection Time: 12/03/19  7:35 AM  Result Value Ref Range   Sodium 140 135 - 145 mmol/L   Potassium 4.7 3.5 - 5.1 mmol/L   Chloride 112 (H) 98 - 111 mmol/L   CO2 19 (L) 22 - 32 mmol/L   Glucose, Bld 84 70 - 99 mg/dL   BUN 18 8 - 23 mg/dL   Creatinine, Ser 1.28 (H) 0.44 - 1.00 mg/dL  Calcium 9.8 8.9 - 10.3 mg/dL   GFR calc non Af Amer 43 (L) >60 mL/min   GFR calc Af Amer 50 (L) >60 mL/min   Anion gap 9 5 - 15    Comment: Performed at [facility] [address], [place], [place] [address]  CBC WITH DIFFERENTIAL     Status: Abnormal   Collection Time: 12/04/19  5:27 AM  Result Value Ref Range   WBC 7.6 4.0 - 10.5 K/uL   RBC 4.48 3.87 - 5.11 MIL/uL   Hemoglobin 13.6 12.0 - 15.0 g/dL   HCT 40.9 36.0 - 46.0 %   MCV 91.3 80.0 - 100.0 fL   MCH 30.4 26.0 - 34.0 pg   MCHC 33.3 30.0 - 36.0 g/dL   RDW 11.9 11.5 - 15.5 %   Platelets 217 150 - 400 K/uL   nRBC 0.0 0.0 - 0.2 %   Neutrophils Relative % 53 %   Neutro Abs 4.1 1.7 - 7.7 K/uL   Lymphocytes Relative 35 %   Lymphs Abs 2.7 0.7 - 4.0 K/uL   Monocytes Relative 9 %   Monocytes Absolute 0.6 0.1 - 1.0 K/uL   Eosinophils Relative 1 %   Eosinophils Absolute 0.0 0.0 - 0.5 K/uL   Basophils Relative 0 %   Basophils Absolute 0.0 0.0 - 0.1 K/uL   Immature Granulocytes 2 %   Abs Immature Granulocytes 0.12 (H) 0.00 - 0.07 K/uL    Comment: Performed at [facility] [address], [place], [place] [address]    Current Facility-Administered Medications  Medication Dose Route Frequency Provider Last Rate Last Admin  . acetaminophen (TYLENOL) tablet 650 mg  650 mg Oral Q6H PRN Reubin Milan, MD       Or  . acetaminophen (TYLENOL) suppository 650 mg  650 mg Rectal Q6H PRN Reubin Milan, MD      . amLODipine [facility]) tablet 5 mg  5 mg Oral Daily Reubin Milan, MD   5 mg at 12/04/19 0943  . amoxicillin (AMOXIL) capsule 500 mg  500 mg Oral Q8H Vann, Jessica U, DO   500 mg at 12/04/19 [id number]  . clonazePAM  (KLONOPIN) tablet 1 mg  1 mg Oral Daily PRN Reubin Milan, MD   1 mg at 12/04/19 [id number]  . cycloSPORINE (SANDIMMUNE) capsule 125 mg  125 mg Oral Daily Reubin Milan, MD   125 mg at 12/04/19 0944  . cycloSPORINE (SANDIMMUNE) capsule 150 mg  150 mg Oral QHS Reubin Milan, MD   150 mg at 12/03/19 2243  . divalproex (DEPAKOTE ER) 24 hr tablet 500 mg  500 mg Oral QHS Reubin Milan, MD   500 mg at 12/03/19 2105  . doxazosin (CARDURA) tablet 1 mg  1 mg Oral QHS Reubin Milan, MD   1 mg at 12/03/19 2106  . fenofibrate tablet 160 mg  160 mg Oral Daily Reubin Milan, MD   160 mg at 12/04/19 0944  . heparin injection 5,000 Units  5,000 Units Subcutaneous Q8H Eulogio Bear U, DO   5,000 Units at 12/04/19 [id number]  . levothyroxine (SYNTHROID) tablet 50 mcg  50 mcg Oral Q0600 Reubin Milan, MD   50 mcg at 12/04/19 [id number]  . magnesium oxide (MAG-OX) tablet 400 mg  400 mg Oral Daily Reubin Milan, MD   400 mg at 12/04/19 0943  . megestrol (MEGACE) tablet 20 mg  20 mg Oral Daily Reubin Milan, MD  20 mg at 12/04/19 0943  . metoprolol tartrate (LOPRESSOR) tablet 100 mg  100 mg Oral BID Reubin Milan, MD   100 mg at 12/04/19 0943  . mycophenolate (CELLCEPT) capsule 250 mg  250 mg Oral BID Reubin Milan, MD   250 mg at 12/04/19 0946  . ondansetron (ZOFRAN) tablet 4 mg  4 mg Oral Q6H PRN Reubin Milan, MD       Or  . ondansetron [facility]) injection 4 mg  4 mg Intravenous Q6H PRN Reubin Milan, MD      . perphenazine (TRILAFON) tablet 8 mg  8 mg Oral QHS Reubin Milan, MD   8 mg at 12/03/19 2105  . senna-docusate (Senokot-S) tablet 1-2 tablet  1-2 tablet Oral QHS PRN Vertis Kelch, NP   2 tablet at 12/04/19 0245  . sodium chloride (OCEAN) 0.65 % nasal spray 1 spray  1 spray Each Nare PRN Geradine Girt, DO        Musculoskeletal: Strength & Muscle Tone: increased Gait & Station: did not witness Patient leans: N/A  Psychiatric  Specialty Exam: Physical Exam  Nursing note and vitals reviewed. Constitutional: She appears well-developed and well-nourished.  HENT:  Head: Normocephalic.  Respiratory: Effort normal.  Musculoskeletal:        General: Normal range of motion.     Cervical back: Normal range of motion.  Neurological: She is alert.  Psychiatric: Her mood appears anxious. Her speech is rapid and/or pressured and tangential. Thought content is delusional. Cognition and memory are impaired. She expresses inappropriate judgment. She is inattentive.    Review of Systems  Psychiatric/Behavioral: Positive for agitation. The patient is nervous/anxious.   All other systems reviewed and are negative.   Blood pressure 131/75, pulse 96, temperature 98.3 F (36.8 C), temperature source Oral, resp. rate 18, height 5\' 7"  (1.702 m), weight 81 kg, SpO2 100 %.Body mass index is 27.97 kg/m.  General Appearance: Disheveled  Eye Contact:  Fair  Speech:  Pressured at times  Volume:  Increased  Mood:  Anxious and Irritable  Affect:  Congruent  Thought Process:  Irrelevant at times  Orientation:  Other:  person  Thought Content:  Delusions and Tangential  Suicidal Thoughts:  No  Homicidal Thoughts:  No  Memory:  Immediate;   Poor Recent;   Poor Remote;   Poor  Judgement:  Impaired  Insight:  Lacking  Psychomotor Activity:  Increased  Concentration:  Concentration: Poor and Attention Span: Poor  Recall:  Poor  Fund of Knowledge:  Fair  Language:  Fair  Akathisia:  No  Handed:  Right  AIMS (if indicated):     Assets:  Leisure Time Resilience Social Support  ADL's:  Impaired  Cognition:  Impaired,  Moderate  Sleep:      69 year old female admitted to the hospital with AMS related to recent falls, history of bipolar disorder.  She has been stabilized for a long time on Depakote, Trilafon, and and Klonopin.  These are no longer working for her and recommendations are placed below.  Treatment Plan  Summary: Bipolar disorder: -Continue Depakote 500 mg daily at bedtime -Discontinue Trilafon 8 mg daily -Start Zyprexa 2.5 mg in the am and 5 mg at bedtime  Anxiety: -Continue Klonopin 1 mg daily PRN  Disposition: No evidence of imminent risk to self or others at present.   Patient does not meet criteria for psychiatric inpatient admission.  Waylan Boga, NP 12/04/2019 10:14 AM

## 2019-12-05 LAB — CBC WITH DIFFERENTIAL/PLATELET
Abs Immature Granulocytes: 0.12 10*3/uL — ABNORMAL HIGH (ref 0.00–0.07)
Basophils Absolute: 0 10*3/uL (ref 0.0–0.1)
Basophils Relative: 0 %
Eosinophils Absolute: 0.1 10*3/uL (ref 0.0–0.5)
Eosinophils Relative: 1 %
HCT: 40 % (ref 36.0–46.0)
Hemoglobin: 13.4 g/dL (ref 12.0–15.0)
Immature Granulocytes: 2 %
Lymphocytes Relative: 32 %
Lymphs Abs: 2.4 10*3/uL (ref 0.7–4.0)
MCH: 30.4 pg (ref 26.0–34.0)
MCHC: 33.5 g/dL (ref 30.0–36.0)
MCV: 90.7 fL (ref 80.0–100.0)
Monocytes Absolute: 0.6 10*3/uL (ref 0.1–1.0)
Monocytes Relative: 8 %
Neutro Abs: 4.4 10*3/uL (ref 1.7–7.7)
Neutrophils Relative %: 57 %
Platelets: 235 10*3/uL (ref 150–400)
RBC: 4.41 MIL/uL (ref 3.87–5.11)
RDW: 12 % (ref 11.5–15.5)
WBC: 7.6 10*3/uL (ref 4.0–10.5)
nRBC: 0 % (ref 0.0–0.2)

## 2019-12-05 LAB — SARS CORONAVIRUS 2 (TAT 6-24 HRS): SARS Coronavirus 2: NEGATIVE

## 2019-12-05 NOTE — Progress Notes (Signed)
Progress Note    Brenda Nolan  [id number] DOB: 06/24/1951  DOA: 11/29/2019 PCP: Patient, No Pcp Per    Brief Narrative:     Medical records reviewed and are as summarized below:  Brenda Nolan is an 69 y.o. female with medical history significant of anxiety, bipolar 1 disorders, stage IIIb chronic kidney disease, unspecified difficulty walking history, oropharyngeal phase dysphagia, history of C. difficile colitis, hypothyroidism, muscle weakness, kidney transplant status on immunosuppressants, history of UTI episodes associated with AMS who is brought to the emergency department due to decreasing mentation in the past 3 days.  Facility reported a fall with head trauma about a week ago.  She was brought to the ER on (11/27/2019) Monday evening and had a CT scan that was negative.  She was seen by behavioral health who recommended an overnight observation in the ED and she was later discharged yesterday morning.  However, she returns today after sliding out of her wheelchair.  She is oriented to name, she knows she is in the hospital, but disoriented to date and recent events.  She is unable to provide further history.  Assessment/Plan:   Principal Problem:   Altered mental status Active Problems:   Bipolar 1 disorder (HCC)   Hypothyroidism   Kidney transplant status   Stage 3b chronic kidney disease   Essential hypertension   AMS (altered mental status)     Altered mental status due to UTI Culture with VRE -IV PCN changed to PO pcn- treat for 7 days -per staff: her baseline is living in an ALF but able to dress self and carry on a conversation- per son, not all put together; does have tremors at baseline     Bipolar 1 disorder (HCC) Continue Depakote ER 500 mg at bedtime.. Continue clonazepam as needed. -was on trilafon but changed to zyprexa by psych consult -psych consult appreciated-- will monitor with new medication changes  Hypoxia with eating -seen by SLP -needs  smaller bites and more control -no sign of aspiration    Hypothyroidism Continue levothyroxine. -tsh normal    Kidney transplant status -Continue cyclosporine and mycophenolate. -Monitor renal function    Stage 3b chronic kidney disease -trend    Essential hypertension Continue amlodipine 5 mg p.o. daily Continue Cardura 1 mg p.o. at bedtime.   Family Communication/Anticipated D/C date and plan/Code Status   DVT prophylaxis: heparin Code Status: Full Code.  Family Communication: spoke to son 1/17 Disposition Plan: SNF placement pending   Medical Consultants:   psych    Subjective:   Focused on getting dressed  Objective:    Vitals:   12/04/19 0759 12/04/19 1307 12/05/19 0258 12/05/19 1013  BP: 131/75 130/76 140/86 129/70  Pulse: 96 89 78 90  Resp: 18 18 16  (!) 25  Temp: 98.3 F (36.8 C) (!) 97.4 F (36.3 C) 98.6 F (37 C) 98.3 F (36.8 C)  TempSrc: Oral Oral Oral Oral  SpO2: 100% 100% 96% 100%  Weight:      Height:        Intake/Output Summary (Last 24 hours) at 12/05/2019 1343 Last data filed at 12/05/2019 1015 Gross per 24 hour  Intake 480 ml  Output 2000 ml  Net -1520 ml   Filed Weights   11/30/19 0900  Weight: 81 kg    Exam: Focused on getting dressed but redirectable rrr No wheezing, no increased work of breathing  Data Reviewed:   I have personally reviewed following labs and imaging studies:  Labs:  Labs show the following:   Basic Metabolic Panel: Recent Labs  Lab 11/29/19 1640 11/29/19 1640 11/30/19 0514 12/03/19 0735  NA 136  --  137 140  K 3.9   < > 4.3 4.7  CL 104  --  109 112*  CO2 18*  --  17* 19*  GLUCOSE 91  --  90 84  BUN 23  --  23 18  CREATININE 1.52*  --  1.49* 1.28*  CALCIUM 10.1  --  8.9 9.8   < > = values in this interval not displayed.   GFR Estimated Creatinine Clearance: 46.7 mL/min (A) (by C-G formula based on SCr of 1.28 mg/dL (H)). Liver Function Tests: Recent Labs  Lab 11/29/19 1640  11/30/19 0514  AST 19 14*  ALT 11 8  ALKPHOS 31* 23*  BILITOT 1.3* 0.8  PROT 7.1 5.5*  ALBUMIN 3.7 2.9*   No results for input(s): LIPASE, AMYLASE in the last 168 hours. Recent Labs  Lab 11/29/19 1640  AMMONIA 18   Coagulation profile Recent Labs  Lab 11/29/19 1852  INR 1.1    CBC: Recent Labs  Lab 12/01/19 0247 12/02/19 0253 12/03/19 0735 12/04/19 0527 12/05/19 0748  WBC 6.6 6.7 5.8 7.6 7.6  NEUTROABS 4.4 3.2 2.8 4.1 4.4  HGB 11.7* 12.4 12.9 13.6 13.4  HCT 34.5* 36.1 38.0 40.9 40.0  MCV 90.3 89.1 90.5 91.3 90.7  PLT 182 187 216 217 235   Cardiac Enzymes: No results for input(s): CKTOTAL, CKMB, CKMBINDEX, TROPONINI in the last 168 hours. BNP (last 3 results) No results for input(s): PROBNP in the last 8760 hours. CBG: Recent Labs  Lab 11/29/19 1621  GLUCAP 77   D-Dimer: No results for input(s): DDIMER in the last 72 hours. Hgb A1c: No results for input(s): HGBA1C in the last 72 hours. Lipid Profile: No results for input(s): CHOL, HDL, LDLCALC, TRIG, CHOLHDL, LDLDIRECT in the last 72 hours. Thyroid function studies: No results for input(s): TSH, T4TOTAL, T3FREE, THYROIDAB in the last 72 hours.  Invalid input(s): FREET3 Anemia work up: No results for input(s): VITAMINB12, FOLATE, FERRITIN, TIBC, IRON, RETICCTPCT in the last 72 hours. Sepsis Labs: Recent Labs  Lab 11/29/19 1640 11/30/19 0514 12/02/19 0253 12/03/19 0735 12/04/19 0527 12/05/19 0748  PROCALCITON 1.07  --   --   --   --   --   WBC 12.8*   < > 6.7 5.8 7.6 7.6  LATICACIDVEN 1.2  --   --   --   --   --    < > = values in this interval not displayed.    Microbiology Recent Results (from the past 240 hour(s))  Respiratory Panel by RT PCR (Flu A&B, Covid) - Nasopharyngeal Swab     Status: None   Collection Time: 11/27/19  4:56 PM   Specimen: Nasopharyngeal Swab  Result Value Ref Range Status   SARS Coronavirus 2 by RT PCR NEGATIVE NEGATIVE Final    Comment: (NOTE) SARS-CoV-2 target  nucleic acids are NOT DETECTED. The SARS-CoV-2 RNA is generally detectable in upper respiratoy specimens during the acute phase of infection. The lowest concentration of SARS-CoV-2 viral copies this assay can detect is 131 copies/mL. A negative result does not preclude SARS-Cov-2 infection and should not be used as the sole basis for treatment or other patient management decisions. A negative result may occur with  improper specimen collection/handling, submission of specimen other than nasopharyngeal swab, presence of viral mutation(s) within the areas targeted by this assay, and inadequate  number of viral copies (<131 copies/mL). A negative result must be combined with clinical observations, patient history, and epidemiological information. The expected result is Negative. Fact Sheet for Patients:  PinkCheek.be Fact Sheet for Healthcare Providers:  GravelBags.it This test is not yet ap proved or cleared by the [place] FDA and  has been authorized for detection and/or diagnosis of SARS-CoV-2 by FDA under an Emergency Use Authorization (EUA). This EUA will remain  in effect (meaning this test can be used) for the duration of the COVID-19 declaration under Section 564(b)(1) of the Act, 21 U.S.C. section 360bbb-3(b)(1), unless the authorization is terminated or revoked sooner.    Influenza A by PCR NEGATIVE NEGATIVE Final   Influenza B by PCR NEGATIVE NEGATIVE Final    Comment: (NOTE) The Xpert Xpress SARS-CoV-2/FLU/RSV assay is intended as an aid in  the diagnosis of influenza from Nasopharyngeal swab specimens and  should not be used as a sole basis for treatment. Nasal washings and  aspirates are unacceptable for Xpert Xpress SARS-CoV-2/FLU/RSV  testing. Fact Sheet for Patients: PinkCheek.be Fact Sheet for Healthcare Providers: GravelBags.it This test is not  yet approved or cleared by the [place] FDA and  has been authorized for detection and/or diagnosis of SARS-CoV-2 by  FDA under an Emergency Use Authorization (EUA). This EUA will remain  in effect (meaning this test can be used) for the duration of the  Covid-19 declaration under Section 564(b)(1) of the Act, 21  U.S.C. section 360bbb-3(b)(1), unless the authorization is  terminated or revoked. Performed at [facility], [place] [address], [place], [place] [address]   Blood Cultures (routine x 2)     Status: None   Collection Time: 11/29/19  4:40 PM   Specimen: BLOOD  Result Value Ref Range Status   Specimen Description BLOOD LEFT ANTECUBITAL  Final   Special Requests   Final    BOTTLES DRAWN AEROBIC AND ANAEROBIC Blood Culture adequate volume   Culture   Final    NO GROWTH 5 DAYS Performed at [facility], [place] [address] [place], [place] [address]    Report Status 12/04/2019 FINAL  Final  Urine culture     Status: Abnormal   Collection Time: 11/29/19  5:02 PM   Specimen: In/Out Cath Urine  Result Value Ref Range Status   Specimen Description IN/OUT CATH URINE  Final   Special Requests   Final    NONE Performed at [facility], [place] [address] [place], [place] [address]    Culture (A)  Final    >=100,000 COLONIES/mL VANCOMYCIN RESISTANT ENTEROCOCCUS   Report Status 12/01/2019 FINAL  Final   Organism ID, Bacteria VANCOMYCIN RESISTANT ENTEROCOCCUS (A)  Final      Susceptibility   Vancomycin resistant enterococcus - MIC*    AMPICILLIN <=2 SENSITIVE Sensitive     NITROFURANTOIN <=16 SENSITIVE Sensitive     VANCOMYCIN >=32 RESISTANT Resistant     LINEZOLID 2 SENSITIVE Sensitive     * >=100,000 COLONIES/mL VANCOMYCIN RESISTANT ENTEROCOCCUS  Blood Cultures (routine x 2)     Status: None   Collection Time: 11/29/19  6:51 PM   Specimen: BLOOD RIGHT HAND  Result Value Ref Range Status   Specimen Description BLOOD RIGHT HAND  Final   Special  Requests   Final    BOTTLES DRAWN AEROBIC AND ANAEROBIC Blood Culture adequate volume   Culture   Final    NO GROWTH 5 DAYS Performed at [facility],  [address], [place], [place] [address]    Report Status 12/04/2019 FINAL  Final  Respiratory Panel by RT PCR (Flu A&B, Covid) - Nasopharyngeal Swab     Status: None   Collection Time: 11/29/19  7:10 PM   Specimen: Nasopharyngeal Swab  Result Value Ref Range Status   SARS Coronavirus 2 by RT PCR NEGATIVE NEGATIVE Final    Comment: (NOTE) SARS-CoV-2 target nucleic acids are NOT DETECTED. The SARS-CoV-2 RNA is generally detectable in upper respiratoy specimens during the acute phase of infection. The lowest concentration of SARS-CoV-2 viral copies this assay can detect is 131 copies/mL. A negative result does not preclude SARS-Cov-2 infection and should not be used as the sole basis for treatment or other patient management decisions. A negative result may occur with  improper specimen collection/handling, submission of specimen other than nasopharyngeal swab, presence of viral mutation(s) within the areas targeted by this assay, and inadequate number of viral copies (<131 copies/mL). A negative result must be combined with clinical observations, patient history, and epidemiological information. The expected result is Negative. Fact Sheet for Patients:  PinkCheek.be Fact Sheet for Healthcare Providers:  GravelBags.it This test is not yet ap proved or cleared by the [place] FDA and  has been authorized for detection and/or diagnosis of SARS-CoV-2 by FDA under an Emergency Use Authorization (EUA). This EUA will remain  in effect (meaning this test can be used) for the duration of the COVID-19 declaration under Section 564(b)(1) of the Act, 21 U.S.C. section 360bbb-3(b)(1), unless the authorization is terminated or revoked sooner.    Influenza A by PCR NEGATIVE  NEGATIVE Final   Influenza B by PCR NEGATIVE NEGATIVE Final    Comment: (NOTE) The Xpert Xpress SARS-CoV-2/FLU/RSV assay is intended as an aid in  the diagnosis of influenza from Nasopharyngeal swab specimens and  should not be used as a sole basis for treatment. Nasal washings and  aspirates are unacceptable for Xpert Xpress SARS-CoV-2/FLU/RSV  testing. Fact Sheet for Patients: PinkCheek.be Fact Sheet for Healthcare Providers: GravelBags.it This test is not yet approved or cleared by the [place] FDA and  has been authorized for detection and/or diagnosis of SARS-CoV-2 by  FDA under an Emergency Use Authorization (EUA). This EUA will remain  in effect (meaning this test can be used) for the duration of the  Covid-19 declaration under Section 564(b)(1) of the Act, 21  U.S.C. section 360bbb-3(b)(1), unless the authorization is  terminated or revoked. Performed at [facility], [place] [address], [place], [place] [address]     Procedures and diagnostic studies:  No results found.  Medications:   . amLODipine  5 mg Oral Daily  . amoxicillin  500 mg Oral Q8H  . cycloSPORINE  125 mg Oral Daily  . cycloSPORINE  150 mg Oral QHS  . divalproex  500 mg Oral QHS  . doxazosin  1 mg Oral QHS  . fenofibrate  160 mg Oral Daily  . heparin injection (subcutaneous)  5,000 Units Subcutaneous Q8H  . levothyroxine  50 mcg Oral Q0600  . magnesium oxide  400 mg Oral Daily  . megestrol  20 mg Oral Daily  . metoprolol tartrate  100 mg Oral BID  . mycophenolate  250 mg Oral BID  . OLANZapine  2.5 mg Oral Daily  . OLANZapine  5 mg Oral QHS   Continuous Infusions:    LOS: 5 days   Brenda Nolan  [facility]   How to contact the [facility]  Attending or Consulting provider [place] or covering provider during after hours [place], for this patient?  1. Check the care team in [facility] and look for a) attending/consulting TRH provider  listed and b) the [facility] team listed 2. Log into [email or web address] and use [place]'s universal password to access. If you do not have the password, please contact the hospital operator. 3. Locate the [facility] provider you are looking for under [facility] and page to a number that you can be directly reached. 4. If you still have difficulty reaching the provider, please page the [facility] (Director on Call) for the Hospitalists listed on amion for assistance.  12/05/2019, 1:43 PM

## 2019-12-05 NOTE — Plan of Care (Signed)
  Problem: Safety: Goal: Ability to remain free from injury will improve Outcome: Progressing   

## 2019-12-05 NOTE — TOC Progression Note (Addendum)
Transition of Care [facility]) - Progression Note    Patient Details  Name: Elesha Wydra MRN: [id number] Date of Birth: 21-Jan-1951  Transition of [facility]) CM/SW Contact  Atilano Median, LCSW Phone Number: 12/05/2019, 2:50 PM  Clinical Narrative:     Update 3:58pm Plan for discharge to [facility] tomorrow pending negative COVID.  Per MD patient is medically stable for discharge. Only one bed offer at this time. CSW reached out to community rep and patient is being re evaluated for [facility] pending bed availability.   COVID test has been ordered for today. Will plan for discharge tomorrow pending bed availability and negative COVID.   CSW left message for patient's son Richardson Landry, waiting on return call.   Expected Discharge Plan: [place] Barriers to Discharge: Continued Medical Work up, SNF Pending bed offer  Expected Discharge Plan and Services Expected Discharge Plan: [place] In-house Referral: Clinical Social Work   Post Acute Care Choice: [place] Living arrangements for the past 2 months: [place]                                       Social Determinants of Health (SDOH) Interventions    Readmission Risk Interventions No flowsheet data found.

## 2019-12-06 LAB — CBC WITH DIFFERENTIAL/PLATELET
Abs Immature Granulocytes: 0.18 10*3/uL — ABNORMAL HIGH (ref 0.00–0.07)
Basophils Absolute: 0 10*3/uL (ref 0.0–0.1)
Basophils Relative: 0 %
Eosinophils Absolute: 0 10*3/uL (ref 0.0–0.5)
Eosinophils Relative: 1 %
HCT: 37.9 % (ref 36.0–46.0)
Hemoglobin: 12.7 g/dL (ref 12.0–15.0)
Immature Granulocytes: 2 %
Lymphocytes Relative: 30 %
Lymphs Abs: 2.4 10*3/uL (ref 0.7–4.0)
MCH: 30.4 pg (ref 26.0–34.0)
MCHC: 33.5 g/dL (ref 30.0–36.0)
MCV: 90.7 fL (ref 80.0–100.0)
Monocytes Absolute: 0.9 10*3/uL (ref 0.1–1.0)
Monocytes Relative: 11 %
Neutro Abs: 4.6 10*3/uL (ref 1.7–7.7)
Neutrophils Relative %: 56 %
Platelets: 224 10*3/uL (ref 150–400)
RBC: 4.18 MIL/uL (ref 3.87–5.11)
RDW: 11.9 % (ref 11.5–15.5)
WBC: 8.1 10*3/uL (ref 4.0–10.5)
nRBC: 0 % (ref 0.0–0.2)

## 2019-12-06 MED ORDER — OLANZAPINE 5 MG PO TABS: 5.0000 mg | ORAL_TABLET | Freq: Every day | ORAL | 0 refills | Status: AC

## 2019-12-06 MED ORDER — OLANZAPINE 2.5 MG PO TABS: 2.5000 mg | ORAL_TABLET | Freq: Every day | ORAL | 0 refills | Status: DC

## 2019-12-06 MED ORDER — CLONAZEPAM 1 MG PO TABS: 1.0000 mg | ORAL_TABLET | Freq: Every day | ORAL | 0 refills | Status: DC | PRN

## 2019-12-06 MED ORDER — AMOXICILLIN 500 MG PO CAPS: 500.0000 mg | ORAL_CAPSULE | Freq: Three times a day (TID) | ORAL | 0 refills | Status: AC

## 2019-12-06 NOTE — Progress Notes (Signed)
Physical Therapy Treatment Patient Details Name: Brenda Nolan MRN: [id number] DOB: [birth year] Today's Date: 12/06/2019    History of Present Illness Pt is a 69 yo female presenting after a fall from her WC with AMS. The pt had been reccently admitted and d/c from the ED (1/12) following a fall with head trauma, the pt returned to the same SNF, and then was readmitted 1/13 following a second fall from her WC with no head trauma. The pt  PMH is sig for anxiety, bipolar 1 disorders, stage IIIb CKD, oropharyngeal phase dysphagia, history of C. difficile colitis, hypothyroidism, kidney transplant status on immunosuppressants, and history of UTI.    PT Comments    Pt in bed upon PT arrival, agreeable to PT session with focus on progressing functional mobility and independence. The pt was able to demonstrate improvements in ambulation endurance, stability, and multi-step command following through the session. The pt was able to demo multiple sit-stand transfers without the use of an AD or assist, and showed good technique with improved stability. The pt will continue to benefit from skilled PT to improve functional mobility with safety before return to ALF.    Follow Up Recommendations  SNF;Supervision/Assistance - 24 hour     Equipment Recommendations  None recommended by PT(defer to post-acute)    Recommendations for Other Services       Precautions / Restrictions Precautions Precautions: Fall Precaution Comments: pt with multiple frequent falls and poor cognition Restrictions Weight Bearing Restrictions: No    Mobility  Bed Mobility Overal bed mobility: Needs Assistance Bed Mobility: Sidelying to Sit;Supine to Sit   Sidelying to sit: Supervision Supine to sit: Supervision     General bed mobility comments: Pt able to move to sitting EOB and back to supine without assist, benefits from supervision for verbal cues for sequencing  Transfers Overall transfer level: Needs  assistance Equipment used: Rolling walker (2 wheeled);None Transfers: Sit to/from Stand Sit to Stand: Min guard         General transfer comment: pt able to complete without assist or AD, min guard for safety, good hand placement without VCs  Ambulation/Gait Ambulation/Gait assistance: Min guard Gait Distance (Feet): 15 Feet(15 ft x3) Assistive device: Rolling walker (2 wheeled) Gait Pattern/deviations: Narrow base of support;Step-through pattern Gait velocity: decreased Gait velocity interpretation: <1.31 ft/sec, indicative of household ambulator General Gait Details: Pt able to ambulate in room with min guard and use of RW for safety, no LOB, but pt decliend further ambulation due to concerns about lack of clothes   Stairs             Wheelchair Mobility    Modified Rankin (Stroke Patients Only)       Balance Overall balance assessment: Needs assistance Sitting-balance support: Bilateral upper extremity supported;Feet supported Sitting balance-Leahy Scale: Normal Sitting balance - Comments: supervision     Standing balance-Leahy Scale: Good Standing balance comment: pt benefits from UE support, safety risk due to poor cog understanding of safety, able to stand without UE support or assist to use hand sanitizer                            Cognition Arousal/Alertness: Awake/alert Behavior During Therapy: Restless;WFL for tasks assessed/performed Overall Cognitive Status: No family/caregiver present to determine baseline cognitive functioning(cog deficits at baseline, but unable to assess baseline as pt is poor historian) Area of Impairment: Attention;Safety/judgement;Problem solving  Orientation Level: Disoriented to;Situation Current Attention Level: Focused     Safety/Judgement: Decreased awareness of safety;Decreased awareness of deficits   Problem Solving: Slow processing;Decreased initiation;Difficulty sequencing;Requires  verbal cues General Comments: pt with sig improvements in behavior today, cooperative and redirectable despite still asking to "put on clothes" that are "in the other room" multiple times through session. The pt was able to appropriately use sarcasm during session and completed multi-step commands with increased time.      Exercises General Exercises - Lower Extremity Long Arc Quad: AROM;Both;15 reps;Seated Straight Leg Raises: AROM;Both;5 reps;Seated Toe Raises: AROM;20 reps;Seated Heel Raises: AROM;Both;20 reps;Seated    General Comments        Pertinent Vitals/Pain Pain Assessment: No/denies pain Pain Intervention(s): Limited activity within patient's tolerance;Monitored during session;Repositioned    Home Living                      Prior Function            PT Goals (current goals can now be found in the care plan section) Acute Rehab PT Goals Patient Stated Goal: put on clothes PT Goal Formulation: With patient Time For Goal Achievement: 12/14/19 Potential to Achieve Goals: Good Progress towards PT goals: Progressing toward goals    Frequency    Min 2X/week      PT Plan Current plan remains appropriate    Co-evaluation              AM-PAC PT "6 Clicks" Mobility   Outcome Measure  Help needed turning from your back to your side while in a flat bed without using bedrails?: A Little Help needed moving from lying on your back to sitting on the side of a flat bed without using bedrails?: A Little Help needed moving to and from a bed to a chair (including a wheelchair)?: A Little Help needed standing up from a chair using your arms (e.g., wheelchair or bedside chair)?: A Little Help needed to walk in hospital room?: A Little Help needed climbing 3-5 steps with a railing? : A Lot 6 Click Score: 17    End of Session Equipment Utilized During Treatment: Gait belt Activity Tolerance: Patient tolerated treatment well Patient left: in bed;with call  bell/phone within reach;with bed alarm set(chair position) Nurse Communication: Mobility status PT Visit Diagnosis: Difficulty in walking, not elsewhere classified (R26.2);Unsteadiness on feet (R26.81);History of falling (Z91.81)     Time: [id number] PT Time Calculation (min) (ACUTE ONLY): 31 min  Charges:  $Gait Training: 8-22 mins $Therapeutic Exercise: 8-22 mins                     Karma Ganja, PT, DPT   Acute Rehabilitation Department Pager #: [phone]   Otho Bellows 12/06/2019, 10:27 AM

## 2019-12-06 NOTE — Discharge Summary (Signed)
Physician Discharge Summary  Brenda Nolan DOB: 12-16-1950 DOA: 11/29/2019  PCP: Patient, No Pcp Per  Admit date: 11/29/2019 Discharge date: 12/06/2019  Admitted From: Home Disposition: SNF  Recommendations for Outpatient Follow-up:  1. Follow up with PCP in 1-2 weeks 2. Please obtain BMP/CBC in one week 3. Please follow up on the following pending results:  Home Health: None Equipment/Devices: None  Discharge Condition: Stable CODE STATUS: Full code Diet recommendation: Cardiac  Subjective: Seen and examined.  She has no complaints.  She is alert and mostly oriented once prompted.  Brief/Interim Summary: Brenda Nolan is an 69 y.o. female with medical history significant ofanxiety, bipolar 1 disorders, stage IIIb chronic kidney disease, unspecified difficulty walking history, oropharyngeal phase dysphagia, history of C. difficile colitis, hypothyroidism, muscle weakness, kidney transplant status on immunosuppressants, history of UTI episodes associated with AMS who was brought to the emergency department due to decreasing mentation in the past 3 days. Facility reported a fall with head trauma about a week ago. She was brought to the ER on (11/27/2019) Monday evening and had a CT scan that was negative. She was seen by behavioral health who recommended an overnight observation in the ED and she was later discharged next dayhowever, she returns on 11/29/2019 after sliding out of her wheelchair. She was oriented to name, she knows she is in the hospital, but disoriented to date and recent events. She was unable to provide further history. She has another CT head which was negative. CXR negative. She was then diagnosed with VRE UTI for which she was initially treated with iv abx empirically and then was switched to oral amoxicillin. Rest of her med issues remained stable and she is going to be discharged to SNF today in stable condition with 4 more days of oral amoxicillin to  complete the course. Of note, she was on trilafon prior to her recent ED visit but changed to zyprexa by psych consult and this was continued here and is bring prescribed to her as well.  Discharge Diagnoses:  Principal Problem:   Altered mental status Active Problems:   Bipolar 1 disorder ([place])   Hypothyroidism   Kidney transplant status   Stage 3b chronic kidney disease   Essential hypertension   AMS (altered mental status)    Discharge Instructions  Discharge Instructions    Discharge patient   Complete by: As directed    Discharge disposition: 03-Skilled Nursing Facility   Discharge patient date: 12/06/2019     Allergies as of 12/06/2019      Reactions   Atorvastatin Other (See Comments)   Myalgias Myalgias   Ketoconazole Rash   Abilify [aripiprazole]    unknown   Ibuprofen    unknown   Levaquin [levofloxacin]    unknown   Sulfa Antibiotics    unknown   Sulfamethoxazole    unknown      Medication List    TAKE these medications   amLODipine 5 MG tablet Commonly known as: NORVASC Take 5 mg by mouth daily.   amoxicillin 500 MG capsule Commonly known as: AMOXIL Take 1 capsule (500 mg total) by mouth every 8 (eight) hours for 4 days.   clonazePAM 1 MG tablet Commonly known as: KLONOPIN Take 1 tablet (1 mg total) by mouth daily as needed for up to 10 doses (For nervousness).   cycloSPORINE 25 MG capsule Commonly known as: SANDIMMUNE Take 125 mg by mouth daily.   cycloSPORINE 25 MG capsule Commonly known as: SANDIMMUNE Take 150 mg  by mouth at bedtime.   divalproex 500 MG 24 hr tablet Commonly known as: DEPAKOTE ER Take 500 mg by mouth at bedtime.   doxazosin 1 MG tablet Commonly known as: CARDURA Take 1 mg by mouth at bedtime.   fenofibrate 160 MG tablet Take 160 mg by mouth daily.   FUSION PLUS PO Take 1 capsule by mouth daily.   levothyroxine 50 MCG tablet Commonly known as: SYNTHROID Take 50 mcg by mouth daily before breakfast.    Magnesium 400 MG Tabs Take 1 tablet by mouth daily.   megestrol 40 MG/ML suspension Commonly known as: MEGACE Take 20 mg by mouth daily.   metoprolol tartrate 100 MG tablet Commonly known as: LOPRESSOR Take 100 mg by mouth 2 (two) times daily.   multivitamin tablet Take 1 tablet by mouth daily.   mycophenolate 250 MG capsule Commonly known as: CELLCEPT Take 250 mg by mouth 2 (two) times daily.   OLANZapine 2.5 MG tablet Commonly known as: ZYPREXA Take 1 tablet (2.5 mg total) by mouth daily.   OLANZapine 5 MG tablet Commonly known as: ZYPREXA Take 1 tablet (5 mg total) by mouth at bedtime.   perphenazine 4 MG tablet Commonly known as: TRILAFON Take 8 mg by mouth at bedtime.      Contact information for after-discharge care    Destination    [facility] .   Service: Skilled Nursing Contact information: [address] [address] [phone]             Allergies  Allergen Reactions  . Atorvastatin Other (See Comments)    Myalgias Myalgias   . Ketoconazole Rash  . Abilify [Aripiprazole]     unknown  . Ibuprofen     unknown  . Levaquin [Levofloxacin]     unknown  . Sulfa Antibiotics     unknown  . Sulfamethoxazole     unknown    Consultations: none   Procedures/Studies: DG Chest 1 View  Result Date: 11/30/2019 CLINICAL DATA:  Altered mental status. EXAM: CHEST  1 VIEW COMPARISON:  11/29/2019.  CT 08/22/2010. FINDINGS: Mediastinum hilar structures normal. Mild left base subsegmental atelectasis and or scarring again noted. No acute infiltrate. No pleural effusion or pneumothorax. Degenerative change thoracic spine. IMPRESSION: No acute cardiopulmonary disease. Mild left base subsegmental atelectasis and or scarring again noted. Electronically Signed   By: Marcello Moores  Register   On: 11/30/2019 16:45   CT HEAD WO CONTRAST  Result Date: 11/30/2019 CLINICAL DATA:  Fall from wheelchair EXAM: CT HEAD  WITHOUT CONTRAST TECHNIQUE: Contiguous axial images were obtained from the base of the skull through the vertex without intravenous contrast. COMPARISON:  11/28/2019 FINDINGS: Brain: No evidence of acute infarction, hemorrhage, hydrocephalus, extra-axial collection or mass lesion/mass effect. Periventricular white matter hypodensity. Vascular: No hyperdense vessel or unexpected calcification. Skull: Normal. Negative for fracture or focal lesion. Sinuses/Orbits: No acute finding. Other: None. IMPRESSION: No acute intracranial pathology.  Small-vessel white matter disease. Electronically Signed   By: Eddie Candle M.D.   On: 11/30/2019 09:41   CT HEAD WO CONTRAST  Result Date: 11/28/2019 CLINICAL DATA:  Altered mental status EXAM: CT HEAD WITHOUT CONTRAST TECHNIQUE: Contiguous axial images were obtained from the base of the skull through the vertex without intravenous contrast. COMPARISON:  None. FINDINGS: Brain: No evidence of acute territorial infarction, hemorrhage, hydrocephalus,extra-axial collection or mass lesion/mass effect. There is dilatation the ventricles and sulci consistent with age-related atrophy. Low-attenuation changes in the deep white matter  consistent with small vessel ischemia. Vascular: No hyperdense vessel or unexpected calcification. Skull: The skull is intact. No fracture or focal lesion identified. Sinuses/Orbits: The visualized paranasal sinuses and mastoid air cells are clear. The orbits and globes intact. Other: None IMPRESSION: No acute intracranial abnormality. Findings consistent with age related atrophy and chronic small vessel ischemia Electronically Signed   By: Prudencio Pair M.D.   On: 11/28/2019 00:29   DG Chest Port 1 View  Result Date: 11/29/2019 CLINICAL DATA:  [place] 1 week ago, decline in function and mental status since EXAM: PORTABLE CHEST 1 VIEW COMPARISON:  Portable exam 1620 hours compared to 01/31/2018 at FINDINGS: Normal heart size, mediastinal contours, and  pulmonary vascularity. Lungs clear. No pulmonary infiltrate, pleural effusion or pneumothorax. Bones demineralized. IMPRESSION: No acute abnormalities. Electronically Signed   By: Lavonia Dana M.D.   On: 11/29/2019 16:33      Discharge Exam: Vitals:   12/06/19 0432 12/06/19 0757  BP: 129/80 139/74  Pulse: 84 89  Resp: 19 19  Temp: 98.3 F (36.8 C) 98.5 F (36.9 C)  SpO2: 96% 99%   Vitals:   12/05/19 1600 12/05/19 2024 12/06/19 0432 12/06/19 0757  BP: 129/84 130/78 129/80 139/74  Pulse: 88 88 84 89  Resp: 18 (!) 21 19 19   Temp: 98.9 F (37.2 C) 98.4 F (36.9 C) 98.3 F (36.8 C) 98.5 F (36.9 C)  TempSrc: Oral Oral  Oral  SpO2: 96% 95% 96% 99%  Weight:      Height:        General: Pt is alert, awake, not in acute distress Cardiovascular: RRR, S1/S2 +, no rubs, no gallops Respiratory: CTA bilaterally, no wheezing, no rhonchi Abdominal: Soft, NT, ND, bowel sounds + Extremities: no edema, no cyanosis    The results of significant diagnostics from this hospitalization (including imaging, microbiology, ancillary and laboratory) are listed below for reference.     Microbiology: Recent Results (from the past 240 hour(s))  Respiratory Panel by RT PCR (Flu A&B, Covid) - Nasopharyngeal Swab     Status: None   Collection Time: 11/27/19  4:56 PM   Specimen: Nasopharyngeal Swab  Result Value Ref [place] Status   SARS Coronavirus 2 by RT PCR NEGATIVE NEGATIVE Final    Comment: (NOTE) SARS-CoV-2 target nucleic acids are NOT DETECTED. The SARS-CoV-2 RNA is generally detectable in upper respiratoy specimens during the acute phase of infection. The lowest concentration of SARS-CoV-2 viral copies this assay can detect is 131 copies/mL. A negative result does not preclude SARS-Cov-2 infection and should not be used as the sole basis for treatment or other patient management decisions. A negative result may occur with  improper specimen collection/handling, submission of specimen  other than nasopharyngeal swab, presence of viral mutation(s) within the areas targeted by this assay, and inadequate number of viral copies (<131 copies/mL). A negative result must be combined with clinical observations, patient history, and epidemiological information. The expected result is Negative. Fact Sheet for Patients:  PinkCheek.be Fact Sheet for Healthcare Providers:  GravelBags.it This test is not yet ap proved or cleared by the [place] FDA and  has been authorized for detection and/or diagnosis of SARS-CoV-2 by FDA under an Emergency Use Authorization (EUA). This EUA will remain  in effect (meaning this test can be used) for the duration of the COVID-19 declaration under Section 564(b)(1) of the Act, 21 U.S.C. section 360bbb-3(b)(1), unless the authorization is terminated or revoked sooner.    Influenza A by PCR NEGATIVE NEGATIVE  Final   Influenza B by PCR NEGATIVE NEGATIVE Final    Comment: (NOTE) The Xpert Xpress SARS-CoV-2/FLU/RSV assay is intended as an aid in  the diagnosis of influenza from Nasopharyngeal swab specimens and  should not be used as a sole basis for treatment. Nasal washings and  aspirates are unacceptable for Xpert Xpress SARS-CoV-2/FLU/RSV  testing. Fact Sheet for Patients: PinkCheek.be Fact Sheet for Healthcare Providers: GravelBags.it This test is not yet approved or cleared by the [place] FDA and  has been authorized for detection and/or diagnosis of SARS-CoV-2 by  FDA under an Emergency Use Authorization (EUA). This EUA will remain  in effect (meaning this test can be used) for the duration of the  Covid-19 declaration under Section 564(b)(1) of the Act, 21  U.S.C. section 360bbb-3(b)(1), unless the authorization is  terminated or revoked. Performed at [facility], [place] [address], [place], [place] [address]   Blood Cultures (routine x 2)     Status: None   Collection Time: 11/29/19  4:40 PM   Specimen: BLOOD  Result Value Ref [place] Status   Specimen Description BLOOD LEFT ANTECUBITAL  Final   Special Requests   Final    BOTTLES DRAWN AEROBIC AND ANAEROBIC Blood Culture adequate volume   Culture   Final    NO GROWTH 5 DAYS Performed at [facility], [place] [address] [place], [place] [address]    Report Status 12/04/2019 FINAL  Final  Urine culture     Status: Abnormal   Collection Time: 11/29/19  5:02 PM   Specimen: In/Out Cath Urine  Result Value Ref [place] Status   Specimen Description IN/OUT CATH URINE  Final   Special Requests   Final    NONE Performed at [facility], [place] [address], [place], [place] [address]    Culture (A)  Final    >=100,000 COLONIES/mL VANCOMYCIN RESISTANT ENTEROCOCCUS   Report Status 12/01/2019 FINAL  Final   Organism ID, Bacteria VANCOMYCIN RESISTANT ENTEROCOCCUS (A)  Final      Susceptibility   Vancomycin resistant enterococcus - MIC*    AMPICILLIN <=2 SENSITIVE Sensitive     NITROFURANTOIN <=16 SENSITIVE Sensitive     VANCOMYCIN >=32 RESISTANT Resistant     LINEZOLID 2 SENSITIVE Sensitive     * >=100,000 COLONIES/mL VANCOMYCIN RESISTANT ENTEROCOCCUS  Blood Cultures (routine x 2)     Status: None   Collection Time: 11/29/19  6:51 PM   Specimen: BLOOD RIGHT HAND  Result Value Ref [place] Status   Specimen Description BLOOD RIGHT HAND  Final   Special Requests   Final    BOTTLES DRAWN AEROBIC AND ANAEROBIC Blood Culture adequate volume   Culture   Final    NO GROWTH 5 DAYS Performed at [facility], [place] [address], [place], [place] [address]    Report Status 12/04/2019 FINAL  Final  Respiratory Panel by RT PCR (Flu A&B, Covid) - Nasopharyngeal Swab     Status: None   Collection Time: 11/29/19  7:10 PM   Specimen: Nasopharyngeal Swab  Result Value Ref [place] Status   SARS Coronavirus 2 by RT PCR NEGATIVE  NEGATIVE Final    Comment: (NOTE) SARS-CoV-2 target nucleic acids are NOT DETECTED. The SARS-CoV-2 RNA is generally detectable in upper respiratoy specimens during the acute phase of infection. The lowest concentration of SARS-CoV-2 viral copies this assay can detect is 131 copies/mL. A negative result does not preclude SARS-Cov-2 infection and should not be  used as the sole basis for treatment or other patient management decisions. A negative result may occur with  improper specimen collection/handling, submission of specimen other than nasopharyngeal swab, presence of viral mutation(s) within the areas targeted by this assay, and inadequate number of viral copies (<131 copies/mL). A negative result must be combined with clinical observations, patient history, and epidemiological information. The expected result is Negative. Fact Sheet for Patients:  PinkCheek.be Fact Sheet for Healthcare Providers:  GravelBags.it This test is not yet ap proved or cleared by the [place] FDA and  has been authorized for detection and/or diagnosis of SARS-CoV-2 by FDA under an Emergency Use Authorization (EUA). This EUA will remain  in effect (meaning this test can be used) for the duration of the COVID-19 declaration under Section 564(b)(1) of the Act, 21 U.S.C. section 360bbb-3(b)(1), unless the authorization is terminated or revoked sooner.    Influenza A by PCR NEGATIVE NEGATIVE Final   Influenza B by PCR NEGATIVE NEGATIVE Final    Comment: (NOTE) The Xpert Xpress SARS-CoV-2/FLU/RSV assay is intended as an aid in  the diagnosis of influenza from Nasopharyngeal swab specimens and  should not be used as a sole basis for treatment. Nasal washings and  aspirates are unacceptable for Xpert Xpress SARS-CoV-2/FLU/RSV  testing. Fact Sheet for Patients: PinkCheek.be Fact Sheet for Healthcare  Providers: GravelBags.it This test is not yet approved or cleared by the [place] FDA and  has been authorized for detection and/or diagnosis of SARS-CoV-2 by  FDA under an Emergency Use Authorization (EUA). This EUA will remain  in effect (meaning this test can be used) for the duration of the  Covid-19 declaration under Section 564(b)(1) of the Act, 21  U.S.C. section 360bbb-3(b)(1), unless the authorization is  terminated or revoked. Performed at [facility], [place] [address], [place], [place] [address]   SARS CORONAVIRUS 2 (TAT 6-24 HRS) Nasopharyngeal Nasopharyngeal Swab     Status: None   Collection Time: 12/05/19  3:52 PM   Specimen: Nasopharyngeal Swab  Result Value [place] Status   SARS Coronavirus 2 NEGATIVE NEGATIVE Final    Comment: (NOTE) SARS-CoV-2 target nucleic acids are NOT DETECTED. The SARS-CoV-2 RNA is generally detectable in upper and lower respiratory specimens during the acute phase of infection. Negative results do not preclude SARS-CoV-2 infection, do not rule out co-infections with other pathogens, and should not be used as the sole basis for treatment or other patient management decisions. Negative results must be combined with clinical observations, patient history, and epidemiological information. The expected result is Negative. Fact Sheet for Patients: SugarRoll.be Fact Sheet for Healthcare Providers: https://www.woods-mathews.com/ This test is not yet approved or cleared by the [place] FDA and  has been authorized for detection and/or diagnosis of SARS-CoV-2 by FDA under an Emergency Use Authorization (EUA). This EUA will remain  in effect (meaning this test can be used) for the duration of the COVID-19 declaration under Section 56 4(b)(1) of the Act, 21 U.S.C. section 360bbb-3(b)(1), unless the authorization is terminated or revoked sooner. Performed at [facility], [place] [address] [place], [place] [address]      Labs: BNP (last 3 results) No results for input(s): BNP in the last 8760 hours. Basic Metabolic Panel: Recent Labs  Lab 11/29/19 1640 11/30/19 0514 12/03/19 0735  NA 136 137 140  K 3.9 4.3 4.7  CL 104 109 112*  CO2 18* 17* 19*  GLUCOSE 91 90 84  BUN 23 23 18  CREATININE 1.52* 1.49* 1.28*  CALCIUM 10.1 8.9 9.8   Liver Function Tests: Recent Labs  Lab 11/29/19 1640 11/30/19 0514  AST 19 14*  ALT 11 8  ALKPHOS 31* 23*  BILITOT 1.3* 0.8  PROT 7.1 5.5*  ALBUMIN 3.7 2.9*   No results for input(s): LIPASE, AMYLASE in the last 168 hours. Recent Labs  Lab 11/29/19 1640  AMMONIA 18   CBC: Recent Labs  Lab 12/02/19 0253 12/03/19 0735 12/04/19 0527 12/05/19 0748 12/06/19 0232  WBC 6.7 5.8 7.6 7.6 8.1  NEUTROABS 3.2 2.8 4.1 4.4 4.6  HGB 12.4 12.9 13.6 13.4 12.7  HCT 36.1 38.0 40.9 40.0 37.9  MCV 89.1 90.5 91.3 90.7 90.7  PLT 187 216 217 235 224   Cardiac Enzymes: No results for input(s): CKTOTAL, CKMB, CKMBINDEX, TROPONINI in the last 168 hours. BNP: Invalid input(s): POCBNP CBG: Recent Labs  Lab 11/29/19 1621  GLUCAP 77   D-Dimer No results for input(s): DDIMER in the last 72 hours. Hgb A1c No results for input(s): HGBA1C in the last 72 hours. Lipid Profile No results for input(s): CHOL, HDL, LDLCALC, TRIG, CHOLHDL, LDLDIRECT in the last 72 hours. Thyroid function studies No results for input(s): TSH, T4TOTAL, T3FREE, THYROIDAB in the last 72 hours.  Invalid input(s): FREET3 Anemia work up No results for input(s): VITAMINB12, FOLATE, FERRITIN, TIBC, IRON, RETICCTPCT in the last 72 hours. Urinalysis    Component Value Date/Time   COLORURINE AMBER (A) 11/29/2019 1702   APPEARANCEUR HAZY (A) 11/29/2019 1702   LABSPEC 1.015 11/29/2019 1702   PHURINE 6.0 11/29/2019 1702   GLUCOSEU NEGATIVE 11/29/2019 1702   HGBUR SMALL (A) 11/29/2019 1702   BILIRUBINUR NEGATIVE 11/29/2019 1702    KETONESUR 5 (A) 11/29/2019 1702   PROTEINUR NEGATIVE 11/29/2019 1702   NITRITE NEGATIVE 11/29/2019 1702   LEUKOCYTESUR NEGATIVE 11/29/2019 1702   Sepsis Labs Invalid input(s): PROCALCITONIN,  WBC,  LACTICIDVEN Microbiology Recent Results (from the past 240 hour(s))  Respiratory Panel by RT PCR (Flu A&B, Covid) - Nasopharyngeal Swab     Status: None   Collection Time: 11/27/19  4:56 PM   Specimen: Nasopharyngeal Swab  Result Value [place] Status   SARS Coronavirus 2 by RT PCR NEGATIVE NEGATIVE Final    Comment: (NOTE) SARS-CoV-2 target nucleic acids are NOT DETECTED. The SARS-CoV-2 RNA is generally detectable in upper respiratoy specimens during the acute phase of infection. The lowest concentration of SARS-CoV-2 viral copies this assay can detect is 131 copies/mL. A negative result does not preclude SARS-Cov-2 infection and should not be used as the sole basis for treatment or other patient management decisions. A negative result may occur with  improper specimen collection/handling, submission of specimen other than nasopharyngeal swab, presence of viral mutation(s) within the areas targeted by this assay, and inadequate number of viral copies (<131 copies/mL). A negative result must be combined with clinical observations, patient history, and epidemiological information. The expected result is Negative. Fact Sheet for Patients:  PinkCheek.be Fact Sheet for Healthcare Providers:  GravelBags.it This test is not yet ap proved or cleared by the [place] FDA and  has been authorized for detection and/or diagnosis of SARS-CoV-2 by FDA under an Emergency Use Authorization (EUA). This EUA will remain  in effect (meaning this test can be used) for the duration of the COVID-19 declaration under Section 564(b)(1) of the Act, 21 U.S.C. section 360bbb-3(b)(1), unless the authorization is terminated or revoked sooner.     Influenza A by PCR NEGATIVE NEGATIVE Final  Influenza B by PCR NEGATIVE NEGATIVE Final    Comment: (NOTE) The Xpert Xpress SARS-CoV-2/FLU/RSV assay is intended as an aid in  the diagnosis of influenza from Nasopharyngeal swab specimens and  should not be used as a sole basis for treatment. Nasal washings and  aspirates are unacceptable for Xpert Xpress SARS-CoV-2/FLU/RSV  testing. Fact Sheet for Patients: PinkCheek.be Fact Sheet for Healthcare Providers: GravelBags.it This test is not yet approved or cleared by the [place] FDA and  has been authorized for detection and/or diagnosis of SARS-CoV-2 by  FDA under an Emergency Use Authorization (EUA). This EUA will remain  in effect (meaning this test can be used) for the duration of the  Covid-19 declaration under Section 564(b)(1) of the Act, 21  U.S.C. section 360bbb-3(b)(1), unless the authorization is  terminated or revoked. Performed at [facility], [place] [address] [place], [place] [address]   Blood Cultures (routine x 2)     Status: None   Collection Time: 11/29/19  4:40 PM   Specimen: BLOOD  Result Value [place] Status   Specimen Description BLOOD LEFT ANTECUBITAL  Final   Special Requests   Final    BOTTLES DRAWN AEROBIC AND ANAEROBIC Blood Culture adequate volume   Culture   Final    NO GROWTH 5 DAYS Performed at [facility], [place] [address] [place], [place] [address]    Report Status 12/04/2019 FINAL  Final  Urine culture     Status: Abnormal   Collection Time: 11/29/19  5:02 PM   Specimen: In/Out Cath Urine  Result Value Ref [place] Status   Specimen Description IN/OUT CATH URINE  Final   Special Requests   Final    NONE Performed at [facility], [place] [address] [place], [place] [address]    Culture (A)  Final    >=100,000 COLONIES/mL VANCOMYCIN RESISTANT ENTEROCOCCUS   Report Status 12/01/2019 FINAL  Final   Organism  ID, Bacteria VANCOMYCIN RESISTANT ENTEROCOCCUS (A)  Final      Susceptibility   Vancomycin resistant enterococcus - MIC*    AMPICILLIN <=2 SENSITIVE Sensitive     NITROFURANTOIN <=16 SENSITIVE Sensitive     VANCOMYCIN >=32 RESISTANT Resistant     LINEZOLID 2 SENSITIVE Sensitive     * >=100,000 COLONIES/mL VANCOMYCIN RESISTANT ENTEROCOCCUS  Blood Cultures (routine x 2)     Status: None   Collection Time: 11/29/19  6:51 PM   Specimen: BLOOD RIGHT HAND  Result Value Ref [place] Status   Specimen Description BLOOD RIGHT HAND  Final   Special Requests   Final    BOTTLES DRAWN AEROBIC AND ANAEROBIC Blood Culture adequate volume   Culture   Final    NO GROWTH 5 DAYS Performed at [facility], [place] [address] [place], [place] [address]    Report Status 12/04/2019 FINAL  Final  Respiratory Panel by RT PCR (Flu A&B, Covid) - Nasopharyngeal Swab     Status: None   Collection Time: 11/29/19  7:10 PM   Specimen: Nasopharyngeal Swab  Result Value Ref [place] Status   SARS Coronavirus 2 by RT PCR NEGATIVE NEGATIVE Final    Comment: (NOTE) SARS-CoV-2 target nucleic acids are NOT DETECTED. The SARS-CoV-2 RNA is generally detectable in upper respiratoy specimens during the acute phase of infection. The lowest concentration of SARS-CoV-2 viral copies this assay can detect is 131 copies/mL. A negative result does not preclude SARS-Cov-2 infection and should not be used as the sole  basis for treatment or other patient management decisions. A negative result may occur with  improper specimen collection/handling, submission of specimen other than nasopharyngeal swab, presence of viral mutation(s) within the areas targeted by this assay, and inadequate number of viral copies (<131 copies/mL). A negative result must be combined with clinical observations, patient history, and epidemiological information. The expected result is Negative. Fact Sheet for Patients:   PinkCheek.be Fact Sheet for Healthcare Providers:  GravelBags.it This test is not yet ap proved or cleared by the [place] FDA and  has been authorized for detection and/or diagnosis of SARS-CoV-2 by FDA under an Emergency Use Authorization (EUA). This EUA will remain  in effect (meaning this test can be used) for the duration of the COVID-19 declaration under Section 564(b)(1) of the Act, 21 U.S.C. section 360bbb-3(b)(1), unless the authorization is terminated or revoked sooner.    Influenza A by PCR NEGATIVE NEGATIVE Final   Influenza B by PCR NEGATIVE NEGATIVE Final    Comment: (NOTE) The Xpert Xpress SARS-CoV-2/FLU/RSV assay is intended as an aid in  the diagnosis of influenza from Nasopharyngeal swab specimens and  should not be used as a sole basis for treatment. Nasal washings and  aspirates are unacceptable for Xpert Xpress SARS-CoV-2/FLU/RSV  testing. Fact Sheet for Patients: PinkCheek.be Fact Sheet for Healthcare Providers: GravelBags.it This test is not yet approved or cleared by the [place] FDA and  has been authorized for detection and/or diagnosis of SARS-CoV-2 by  FDA under an Emergency Use Authorization (EUA). This EUA will remain  in effect (meaning this test can be used) for the duration of the  Covid-19 declaration under Section 564(b)(1) of the Act, 21  U.S.C. section 360bbb-3(b)(1), unless the authorization is  terminated or revoked. Performed at [facility], [place] [address], [place], [place] [address]   SARS CORONAVIRUS 2 (TAT 6-24 HRS) Nasopharyngeal Nasopharyngeal Swab     Status: None   Collection Time: 12/05/19  3:52 PM   Specimen: Nasopharyngeal Swab  Result Value Ref Range Status   SARS Coronavirus 2 NEGATIVE NEGATIVE Final    Comment: (NOTE) SARS-CoV-2 target nucleic acids are NOT DETECTED. The SARS-CoV-2 RNA is  generally detectable in upper and lower respiratory specimens during the acute phase of infection. Negative results do not preclude SARS-CoV-2 infection, do not rule out co-infections with other pathogens, and should not be used as the sole basis for treatment or other patient management decisions. Negative results must be combined with clinical observations, patient history, and epidemiological information. The expected result is Negative. Fact Sheet for Patients: SugarRoll.be Fact Sheet for Healthcare Providers: https://www.woods-mathews.com/ This test is not yet approved or cleared by the [place] FDA and  has been authorized for detection and/or diagnosis of SARS-CoV-2 by FDA under an Emergency Use Authorization (EUA). This EUA will remain  in effect (meaning this test can be used) for the duration of the COVID-19 declaration under Section 56 4(b)(1) of the Act, 21 U.S.C. section 360bbb-3(b)(1), unless the authorization is terminated or revoked sooner. Performed at [facility], [place] [address], [place], [place] [address]      Time coordinating discharge: Over 30 minutes  SIGNED:   Darliss Cheney, MD  [facility] 12/06/2019, 9:01 AM  If 7PM-7AM, please contact night-coverage [email or web address] Password [email or web address]

## 2019-12-06 NOTE — TOC Transition Note (Addendum)
Transition of Care [facility]) - CM/SW Discharge Note   Patient Details  Name: Jisella Herda MRN: [id number] Date of Birth: 1951-04-20  [facility] CM/SW Contact:  Atilano Median, LCSW Phone Number: 12/06/2019, 11:06 AM   Clinical Narrative:    Discharged to [facility]. Referral coordinated with Ebony Hail. Patient's son Richardson Landry aware and agreeable to this plan. Number to call report [phone] room 121 given to unit RN Neva. No other needs at this time. Case closed to this CSW.    Final next level of care: Skilled Nursing Facility Barriers to Discharge: Barriers Resolved   Patient Goals and CMS Choice Patient states their goals for this hospitalization and ongoing recovery are:: Rehab CMS Medicare.gov Compare Post Acute Care list provided to:: Patient Represenative (must comment)(Son) Choice offered to / list presented to : Adult Children  Discharge Placement   Existing PASRR number confirmed : 12/06/19          Patient chooses bed at: Other - please specify in the comment section below:([facility]) Patient to be transferred to facility by: [facility] Name of family member notified: Richardson Landry Patient and family notified of of transfer: 12/06/19  Discharge Plan and Services In-house Referral: Clinical Social Work   Post Acute Care Choice: [facility]                               Social Determinants of Health (SDOH) Interventions     Readmission Risk Interventions No flowsheet data found.

## 2019-12-06 NOTE — Discharge Instructions (Signed)

## 2019-12-16 ENCOUNTER — Emergency Department (HOSPITAL_COMMUNITY)
Admission: EM | Admit: 2019-12-16 | Discharge: 2019-12-16 | Disposition: A | Discharge status: Skilled Nursing Facility | Payer: Medicare Other | Attending: Emergency Medicine | Admitting: Emergency Medicine

## 2019-12-16 ENCOUNTER — Other Ambulatory Visit: Payer: Self-pay

## 2019-12-16 ENCOUNTER — Encounter (HOSPITAL_COMMUNITY): Payer: Self-pay

## 2019-12-16 DIAGNOSIS — F419 Anxiety disorder, unspecified: Secondary | ICD-10-CM | POA: Diagnosis present

## 2019-12-16 DIAGNOSIS — R000 Tachycardia, unspecified: Secondary | ICD-10-CM | POA: Insufficient documentation

## 2019-12-16 DIAGNOSIS — E860 Dehydration: Secondary | ICD-10-CM | POA: Diagnosis not present

## 2019-12-16 LAB — URINALYSIS, ROUTINE W REFLEX MICROSCOPIC
Bilirubin Urine: NEGATIVE
Glucose, UA: 150 mg/dL — AB
Hgb urine dipstick: NEGATIVE
Ketones, ur: NEGATIVE mg/dL
Leukocytes,Ua: NEGATIVE
Nitrite: NEGATIVE
Protein, ur: NEGATIVE mg/dL
Specific Gravity, Urine: 1.02 (ref 1.005–1.030)
pH: 6 (ref 5.0–8.0)

## 2019-12-16 LAB — CBC WITH DIFFERENTIAL/PLATELET
Abs Immature Granulocytes: 0.12 10*3/uL — ABNORMAL HIGH (ref 0.00–0.07)
Basophils Absolute: 0.1 10*3/uL (ref 0.0–0.1)
Basophils Relative: 0 %
Eosinophils Absolute: 0.1 10*3/uL (ref 0.0–0.5)
Eosinophils Relative: 1 %
HCT: 42.9 % (ref 36.0–46.0)
Hemoglobin: 13.2 g/dL (ref 12.0–15.0)
Immature Granulocytes: 1 %
Lymphocytes Relative: 12 %
Lymphs Abs: 1.5 10*3/uL (ref 0.7–4.0)
MCH: 30.3 pg (ref 26.0–34.0)
MCHC: 30.8 g/dL (ref 30.0–36.0)
MCV: 98.6 fL (ref 80.0–100.0)
Monocytes Absolute: 1 10*3/uL (ref 0.1–1.0)
Monocytes Relative: 8 %
Neutro Abs: 9.6 10*3/uL — ABNORMAL HIGH (ref 1.7–7.7)
Neutrophils Relative %: 78 %
Platelets: 323 10*3/uL (ref 150–400)
RBC: 4.35 MIL/uL (ref 3.87–5.11)
RDW: 12.5 % (ref 11.5–15.5)
WBC: 12.2 10*3/uL — ABNORMAL HIGH (ref 4.0–10.5)
nRBC: 0 % (ref 0.0–0.2)

## 2019-12-16 LAB — BASIC METABOLIC PANEL
Anion gap: 11 (ref 5–15)
BUN: 33 mg/dL — ABNORMAL HIGH (ref 8–23)
CO2: 16 mmol/L — ABNORMAL LOW (ref 22–32)
Calcium: 9.8 mg/dL (ref 8.9–10.3)
Chloride: 120 mmol/L — ABNORMAL HIGH (ref 98–111)
Creatinine, Ser: 1.29 mg/dL — ABNORMAL HIGH (ref 0.44–1.00)
GFR calc Af Amer: 50 mL/min — ABNORMAL LOW (ref >60)
GFR calc non Af Amer: 43 mL/min — ABNORMAL LOW (ref >60)
Glucose, Bld: 120 mg/dL — ABNORMAL HIGH (ref 70–99)
Potassium: 4.6 mmol/L (ref 3.5–5.1)
Sodium: 147 mmol/L — ABNORMAL HIGH (ref 135–145)

## 2019-12-16 MED ORDER — LORAZEPAM 1 MG PO TABS
1.0000 mg | ORAL_TABLET | Freq: Once | ORAL | Status: AC
  Administered 2019-12-16: 1 mg via ORAL
  Filled 2019-12-16: qty 1

## 2019-12-16 MED ORDER — LORAZEPAM 1 MG PO TABS: 1.0000 mg | ORAL_TABLET | Freq: Three times a day (TID) | ORAL | 0 refills | Status: DC | PRN

## 2019-12-16 MED ORDER — QUETIAPINE FUMARATE 25 MG PO TABS
25.0000 mg | ORAL_TABLET | Freq: Every day | ORAL | Status: DC
  Administered 2019-12-16: 25 mg via ORAL
  Filled 2019-12-16: qty 1

## 2019-12-16 MED ORDER — SODIUM CHLORIDE 0.9 % IV BOLUS
1000.0000 mL | Freq: Once | INTRAVENOUS | Status: AC
  Administered 2019-12-16: 1000 mL via INTRAVENOUS

## 2019-12-16 NOTE — ED Triage Notes (Signed)
Pt arrives [facility] from [place]. Pt reports she is anxious with hx of the same. Pt is tachycardiac. Pt recently admitted for UTI and metabolic encephalopathy. Pt denies any other symptoms other than anxiety

## 2019-12-16 NOTE — Discharge Instructions (Addendum)
Your testing does not indicate any serious problems.  You are likely having an anxiety reaction that can be treated with benzodiazepines.  This may be a medicine that you will always have to take.  We are giving her enough to last about a week.  Please see your doctor within the next few days to arrange for further care and treatment as needed.  The blood testing indicates that you are mildly dehydrated today.  Try to drink 4-5 extra glasses of water each day.  Have your doctor check your chemistry blood panel, next week.

## 2019-12-16 NOTE — ED Notes (Signed)
EDP at bedside  

## 2019-12-16 NOTE — ED Notes (Signed)
Lab called to add on Urine

## 2019-12-16 NOTE — ED Provider Notes (Signed)
[facility] DEPT Provider Note   CSN: [id number] Arrival date & time: 12/16/19  1114     History Chief Complaint  Patient presents with  . Anxiety  . Tachycardia    Brenda Nolan is a 69 y.o. female.  HPI She presents for evaluation of anxiety.  She was discharged from hospital 10 days ago after an admission for weakness, with fall from wheelchair, and was diagnosed with VRE UTI.  This was treated with amoxicillin.  She was discharged to a skilled nursing facility.  Patient is a poor historian.  Her primary complaint is "sweating."  Level 5 caveat-confusion    Past Medical History:  Diagnosis Date  . Anxiety 2019  . Bipolar 1 disorder ([place]) 2019  . Chronic kidney disease (CKD)    Stage 1  . Difficulty in walking, not elsewhere classified   . Dysphagia, oropharyngeal phase   . Enterocolitis due to Clostridioides difficile 2019  . Hypothyroidism 2019  . Kidney transplant status   . Muscle weakness   . Urinary tract infection     Patient Active Problem List   Diagnosis Date Noted  . AMS (altered mental status) 11/30/2019  . Altered mental status 11/29/2019  . Kidney transplant status   . Stage 3b chronic kidney disease   . Essential hypertension   . Bipolar 1 disorder ([place]) 2019  . Hypothyroidism 2019    No past surgical history on file.   OB History   No obstetric history on file.     No family history on file.  Social History   Tobacco Use  . Smoking status: Never Smoker  . Smokeless tobacco: Never Used  Substance Use Topics  . Alcohol use: Not Currently  . Drug use: Never    Home Medications Prior to Admission medications   Medication Sig Start Date End Date Taking? Authorizing Provider  amLODipine (NORVASC) 5 MG tablet Take 5 mg by mouth daily.   Yes [provider]  clonazePAM (KLONOPIN) 1 MG tablet Take 1 tablet (1 mg total) by mouth daily as needed for up to 10 doses (For nervousness). 12/06/19  Yes  Darliss Cheney, MD  cycloSPORINE (SANDIMMUNE) 25 MG capsule Take 125 mg by mouth daily.   Yes [provider]  cycloSPORINE (SANDIMMUNE) 25 MG capsule Take 150 mg by mouth at bedtime.   Yes [provider]  divalproex (DEPAKOTE ER) 500 MG 24 hr tablet Take 500 mg by mouth at bedtime.   Yes [provider]  doxazosin (CARDURA) 1 MG tablet Take 1 mg by mouth at bedtime.   Yes [provider]  fenofibrate 160 MG tablet Take 160 mg by mouth daily.   Yes [provider]  levothyroxine (SYNTHROID) 50 MCG tablet Take 50 mcg by mouth daily before breakfast.   Yes [provider]  Magnesium 400 MG TABS Take 1 tablet by mouth daily.   Yes [provider]  megestrol (MEGACE) 40 MG/ML suspension Take 20 mg by mouth daily.   Yes [provider]  metoprolol tartrate (LOPRESSOR) 100 MG tablet Take 100 mg by mouth 2 (two) times daily.   Yes [provider]  Multiple Vitamin (MULTIVITAMIN) tablet Take 1 tablet by mouth daily.   Yes [provider]  mycophenolate (CELLCEPT) 250 MG capsule Take 250 mg by mouth 2 (two) times daily.   Yes [provider]  OLANZapine (ZYPREXA) 2.5 MG tablet Take 1 tablet (2.5 mg total) by mouth daily. 12/06/19 01/05/20 Yes Pahwani, Einar Grad,  MD  OLANZapine (ZYPREXA) 5 MG tablet Take 1 tablet (5 mg total) by mouth at bedtime. 12/06/19 01/05/20 Yes Pahwani, Einar Grad, MD  LORazepam (ATIVAN) 1 MG tablet Take 1 tablet (1 mg total) by mouth 3 (three) times daily as needed for anxiety. 12/16/19   Daleen Bo, MD    Allergies    Atorvastatin, Ketoconazole, Abilify [aripiprazole], Ibuprofen, Levaquin [levofloxacin], Sulfa antibiotics, and Sulfamethoxazole  Review of Systems   Review of Systems  Unable to perform ROS: Mental status change    Physical Exam Updated Vital Signs BP (!) 156/91   Pulse 97   Temp 99.3 F (37.4 C) (Rectal)   Resp (!) 34   Ht '5\' 5"'$  (1.651 m)   Wt 81 kg   LMP  (LMP  Unknown)   SpO2 100%   BMI 29.72 kg/m   Physical Exam Vitals and nursing note reviewed.  Constitutional:      General: She is not in acute distress.    Appearance: She is well-developed. She is not ill-appearing, toxic-appearing or diaphoretic.     Comments: Tremulous  HENT:     Head: Normocephalic and atraumatic.     Right Ear: External ear normal.     Left Ear: External ear normal.     Mouth/Throat:     Mouth: Mucous membranes are moist.     Pharynx: No oropharyngeal exudate or posterior oropharyngeal erythema.  Eyes:     Conjunctiva/sclera: Conjunctivae normal.     Pupils: Pupils are equal, round, and reactive to light.  Neck:     Trachea: Phonation normal.  Cardiovascular:     Rate and Rhythm: Normal rate and regular rhythm.     Heart sounds: Normal heart sounds.     Comments: During physical exam, patient is tachycardic around 105, with periods of sudden increase to the 140s and then quickly lowering to the low 100s. Pulmonary:     Effort: Pulmonary effort is normal.     Breath sounds: Normal breath sounds.  Abdominal:     General: There is no distension.     Palpations: Abdomen is soft.     Tenderness: There is no abdominal tenderness.  Musculoskeletal:        General: No swelling or tenderness. Normal range of motion.     Cervical back: Normal range of motion and neck supple.     Right lower leg: No edema.     Left lower leg: No edema.  Skin:    General: Skin is warm and dry.     Coloration: Skin is not pale.     Findings: No erythema.  Neurological:     Mental Status: She is alert. She is disoriented.     Cranial Nerves: No cranial nerve deficit.     Sensory: No sensory deficit.     Motor: No weakness or abnormal muscle tone.     Coordination: Coordination normal.  Psychiatric:        Behavior: Behavior normal.     Comments: Anxious     ED Results / Procedures / Treatments   Labs (all labs ordered are listed, but only abnormal results are  displayed) Labs Reviewed  CBC WITH DIFFERENTIAL/PLATELET - Abnormal; Notable for the following components:      Result Value   WBC 12.2 (*)    Neutro Abs 9.6 (*)    Abs Immature Granulocytes 0.12 (*)    All other components within normal limits  BASIC METABOLIC PANEL - Abnormal; Notable for the following components:  Sodium 147 (*)    Chloride 120 (*)    CO2 16 (*)    Glucose, Bld 120 (*)    BUN 33 (*)    Creatinine, Ser 1.29 (*)    GFR calc non Af Amer 43 (*)    GFR calc Af Amer 50 (*)    All other components within normal limits  URINALYSIS, ROUTINE W REFLEX MICROSCOPIC - Abnormal; Notable for the following components:   Glucose, UA 150 (*)    All other components within normal limits    EKG EKG Interpretation  Date/Time:  Saturday December 16 2019 11:38:02 EST Ventricular Rate:  138 PR Interval:    QRS Duration: 104 QT Interval:  343 QTC Calculation: 520 R Axis:   48 Text Interpretation: Sinus tachycardia LAE, consider biatrial enlargement Low voltage, precordial leads Borderline repolarization abnormality Prolonged QT interval Baseline wander in lead(s) II aVF V1 Since last tracing rate faster and QT is longer Otherwise no significant change Confirmed by Daleen Bo [phone]) on 12/16/2019 12:32:21 PM   Radiology No results found.  Procedures .Critical Care Performed by: Daleen Bo, MD Authorized by: Daleen Bo, MD   Critical care provider statement:    Critical care time (minutes):  35   Critical care start time:  12/16/2019 11:30 AM   Critical care end time:  12/16/2019 3:24 PM   Critical care time was exclusive of:  Separately billable procedures and treating other patients   Critical care was time spent personally by me on the following activities:  Blood draw for specimens, development of treatment plan with patient or surrogate, discussions with consultants, evaluation of patient's response to treatment, examination of patient, obtaining history from  patient or surrogate, ordering and performing treatments and interventions, ordering and review of laboratory studies, pulse oximetry, re-evaluation of patient's condition, review of old charts and ordering and review of radiographic studies   (including critical care time)  Medications Ordered in ED Medications  QUEtiapine (SEROQUEL) tablet 25 mg (25 mg Oral Given 12/16/19 1433)  LORazepam (ATIVAN) tablet 1 mg (1 mg Oral Given 12/16/19 1433)  sodium chloride 0.9 % bolus 1,000 mL (1,000 mLs Intravenous Bolus 12/16/19 1416)    ED Course  I have reviewed the triage vital signs and the nursing notes.  Pertinent labs & imaging results that were available during my care of the patient were reviewed by me and considered in my medical decision making (see chart for details).  Clinical Course as of Dec 15 1524  Sat Dec 16, 2019  1516 Normal  Urinalysis, Routine w reflex microscopic(!) [EW]  1516 Normal except white count elevated  CBC with Differential(!) [EW]  1516 Normal except sodium high, chloride high, CO2 low, glucose high, BUN high, creatinine high, GFR low  Basic metabolic panel(!) [EW]    Clinical Course User Index [EW] Daleen Bo, MD   MDM Rules/Calculators/A&P                       Patient Vitals for the past 24 hrs:  BP Temp Temp src Pulse Resp SpO2 Height Weight  12/16/19 1400 (!) 156/91 -- -- 97 (!) 34 100 % -- --  12/16/19 1358 -- -- -- 94 (!) 27 100 % -- --  12/16/19 1349 -- -- -- (!) 134 (!) 29 100 % -- --  12/16/19 1330 (!) 150/94 -- -- 100 (!) 38 100 % -- --  12/16/19 1311 -- -- -- (!) 103 (!) 25 100 % -- --  12/16/19 1309 (!) 150/95 -- -- (!) 105 (!) 32 100 % -- --  12/16/19 1300 -- -- -- (!) 103 (!) 26 100 % -- --  12/16/19 1200 -- -- -- (!) 130 (!) 27 100 % -- --  12/16/19 1139 -- -- -- -- -- -- '5\' 5"'$  (1.651 m) 81 kg  12/16/19 1138 (!) 159/102 99.3 F (37.4 C) Rectal (!) 137 (!) 26 100 % -- --  12/16/19 1122 -- -- -- -- -- 100 % -- --    3:26 PM  Reevaluation with update and discussion. After initial assessment and treatment, an updated evaluation reveals no change in clinical status. Daleen Bo   Medical Decision Making: Patient with nonspecific complaints, and recent hospitalization.  She has been diagnosed with nonspecific altered mental status.  History of VRE UTI, during prior hospitalization.  History of bipolar disorder.  Screening evaluation is reassuring.  No evidence for UTI.  Nonspecific elevation of white count.  No clinical signs for infection.  Being met indicative of dehydration, nonspecific.  Patient not having vomiting or diarrhea.  Treated with IV fluids in the ED. she is stable for discharge.  Will refill Ativan prescription which appears to have run out.  CRITICAL CARE-yes Performed by: Daleen Bo   Nursing Notes Reviewed/ Care Coordinated Applicable Imaging Reviewed Interpretation of Laboratory Data incorporated into ED treatment  The patient appears reasonably screened and/or stabilized for discharge and I doubt any other medical condition or other [facility] requiring further screening, evaluation, or treatment in the ED at this time prior to discharge.  Plan: Home Medications-continue usual; Home Treatments-rest, fluids; return here if the recommended treatment, does not improve the symptoms; Recommended follow up-PCP, as needed    Final Clinical Impression(s) / ED Diagnoses Final diagnoses:  Anxiety  Dehydration    Rx / DC Orders ED Discharge Orders         Ordered    LORazepam (ATIVAN) 1 MG tablet  3 times daily PRN     12/16/19 1521           Daleen Bo, MD 12/16/19 1527

## 2019-12-16 NOTE — ED Notes (Signed)
Attempted to call report to [place] at this time. No answer

## 2019-12-16 NOTE — ED Notes (Signed)
Report given to [facility]

## 2019-12-16 NOTE — ED Notes (Signed)
Patient given water and [place] sandwich

## 2019-12-16 NOTE — ED Notes (Signed)
Report given back to [facility]

## 2019-12-17 ENCOUNTER — Emergency Department (HOSPITAL_COMMUNITY): Payer: Medicare Other

## 2019-12-17 ENCOUNTER — Encounter (HOSPITAL_COMMUNITY): Payer: Self-pay | Admitting: Emergency Medicine

## 2019-12-17 ENCOUNTER — Inpatient Hospital Stay (HOSPITAL_COMMUNITY)
Admission: EM | Admit: 2019-12-17 | Discharge: 2020-01-05 | DRG: 640 | Disposition: A | Discharge status: Home / Self Care | Payer: Medicare Other | Attending: Internal Medicine | Admitting: Internal Medicine

## 2019-12-17 ENCOUNTER — Other Ambulatory Visit: Payer: Self-pay

## 2019-12-17 DIAGNOSIS — N186 End stage renal disease: Secondary | ICD-10-CM | POA: Diagnosis present

## 2019-12-17 DIAGNOSIS — E872 Acidosis: Principal | ICD-10-CM | POA: Diagnosis present

## 2019-12-17 DIAGNOSIS — Z888 Allergy status to other drugs, medicaments and biological substances status: Secondary | ICD-10-CM

## 2019-12-17 DIAGNOSIS — F419 Anxiety disorder, unspecified: Secondary | ICD-10-CM | POA: Diagnosis present

## 2019-12-17 DIAGNOSIS — F315 Bipolar disorder, current episode depressed, severe, with psychotic features: Secondary | ICD-10-CM | POA: Diagnosis present

## 2019-12-17 DIAGNOSIS — R079 Chest pain, unspecified: Secondary | ICD-10-CM | POA: Diagnosis present

## 2019-12-17 DIAGNOSIS — Y92230 Patient room in hospital as the place of occurrence of the external cause: Secondary | ICD-10-CM | POA: Diagnosis not present

## 2019-12-17 DIAGNOSIS — Z87440 Personal history of urinary (tract) infections: Secondary | ICD-10-CM

## 2019-12-17 DIAGNOSIS — I120 Hypertensive chronic kidney disease with stage 5 chronic kidney disease or end stage renal disease: Secondary | ICD-10-CM | POA: Diagnosis present

## 2019-12-17 DIAGNOSIS — R000 Tachycardia, unspecified: Secondary | ICD-10-CM | POA: Diagnosis not present

## 2019-12-17 DIAGNOSIS — Z882 Allergy status to sulfonamides status: Secondary | ICD-10-CM

## 2019-12-17 DIAGNOSIS — R45851 Suicidal ideations: Secondary | ICD-10-CM | POA: Diagnosis present

## 2019-12-17 DIAGNOSIS — T4395XA Adverse effect of unspecified psychotropic drug, initial encounter: Secondary | ICD-10-CM | POA: Diagnosis not present

## 2019-12-17 DIAGNOSIS — Z8619 Personal history of other infectious and parasitic diseases: Secondary | ICD-10-CM

## 2019-12-17 DIAGNOSIS — G3184 Mild cognitive impairment, so stated: Secondary | ICD-10-CM | POA: Diagnosis present

## 2019-12-17 DIAGNOSIS — N390 Urinary tract infection, site not specified: Secondary | ICD-10-CM | POA: Diagnosis not present

## 2019-12-17 DIAGNOSIS — Z79890 Hormone replacement therapy (postmenopausal): Secondary | ICD-10-CM

## 2019-12-17 DIAGNOSIS — F319 Bipolar disorder, unspecified: Secondary | ICD-10-CM

## 2019-12-17 DIAGNOSIS — Z20822 Contact with and (suspected) exposure to covid-19: Secondary | ICD-10-CM | POA: Diagnosis present

## 2019-12-17 DIAGNOSIS — Z751 Person awaiting admission to adequate facility elsewhere: Secondary | ICD-10-CM

## 2019-12-17 DIAGNOSIS — G2571 Drug induced akathisia: Secondary | ICD-10-CM | POA: Diagnosis not present

## 2019-12-17 DIAGNOSIS — E039 Hypothyroidism, unspecified: Secondary | ICD-10-CM | POA: Diagnosis present

## 2019-12-17 DIAGNOSIS — Z940 Kidney transplant status: Secondary | ICD-10-CM

## 2019-12-17 DIAGNOSIS — Z881 Allergy status to other antibiotic agents status: Secondary | ICD-10-CM

## 2019-12-17 DIAGNOSIS — Z79899 Other long term (current) drug therapy: Secondary | ICD-10-CM

## 2019-12-17 LAB — RAPID URINE DRUG SCREEN, HOSP PERFORMED
Amphetamines: NOT DETECTED
Barbiturates: NOT DETECTED
Benzodiazepines: POSITIVE — AB
Cocaine: NOT DETECTED
Opiates: NOT DETECTED
Tetrahydrocannabinol: NOT DETECTED

## 2019-12-17 LAB — COMPREHENSIVE METABOLIC PANEL
ALT: 8 U/L (ref 0–44)
AST: 15 U/L (ref 15–41)
Albumin: 3.6 g/dL (ref 3.5–5.0)
Alkaline Phosphatase: 29 U/L — ABNORMAL LOW (ref 38–126)
Anion gap: 9 (ref 5–15)
BUN: 33 mg/dL — ABNORMAL HIGH (ref 8–23)
CO2: 18 mmol/L — ABNORMAL LOW (ref 22–32)
Calcium: 9.5 mg/dL (ref 8.9–10.3)
Chloride: 113 mmol/L — ABNORMAL HIGH (ref 98–111)
Creatinine, Ser: 1.35 mg/dL — ABNORMAL HIGH (ref 0.44–1.00)
GFR calc Af Amer: 47 mL/min — ABNORMAL LOW (ref >60)
GFR calc non Af Amer: 41 mL/min — ABNORMAL LOW (ref >60)
Glucose, Bld: 93 mg/dL (ref 70–99)
Potassium: 4.4 mmol/L (ref 3.5–5.1)
Sodium: 140 mmol/L (ref 135–145)
Total Bilirubin: 0.6 mg/dL (ref 0.3–1.2)
Total Protein: 7.1 g/dL (ref 6.5–8.1)

## 2019-12-17 LAB — CBC WITH DIFFERENTIAL/PLATELET
Abs Immature Granulocytes: 0.09 10*3/uL — ABNORMAL HIGH (ref 0.00–0.07)
Basophils Absolute: 0.1 10*3/uL (ref 0.0–0.1)
Basophils Relative: 0 %
Eosinophils Absolute: 0.1 10*3/uL (ref 0.0–0.5)
Eosinophils Relative: 1 %
HCT: 39.5 % (ref 36.0–46.0)
Hemoglobin: 13 g/dL (ref 12.0–15.0)
Immature Granulocytes: 1 %
Lymphocytes Relative: 15 %
Lymphs Abs: 1.9 10*3/uL (ref 0.7–4.0)
MCH: 31.1 pg (ref 26.0–34.0)
MCHC: 32.9 g/dL (ref 30.0–36.0)
MCV: 94.5 fL (ref 80.0–100.0)
Monocytes Absolute: 1.1 10*3/uL — ABNORMAL HIGH (ref 0.1–1.0)
Monocytes Relative: 8 %
Neutro Abs: 10.1 10*3/uL — ABNORMAL HIGH (ref 1.7–7.7)
Neutrophils Relative %: 75 %
Platelets: 295 10*3/uL (ref 150–400)
RBC: 4.18 MIL/uL (ref 3.87–5.11)
RDW: 12.5 % (ref 11.5–15.5)
WBC: 13.4 10*3/uL — ABNORMAL HIGH (ref 4.0–10.5)
nRBC: 0 % (ref 0.0–0.2)

## 2019-12-17 LAB — URINALYSIS, ROUTINE W REFLEX MICROSCOPIC
Bilirubin Urine: NEGATIVE
Glucose, UA: 50 mg/dL — AB
Hgb urine dipstick: NEGATIVE
Ketones, ur: NEGATIVE mg/dL
Leukocytes,Ua: NEGATIVE
Nitrite: NEGATIVE
Protein, ur: NEGATIVE mg/dL
Specific Gravity, Urine: 1.019 (ref 1.005–1.030)
pH: 5 (ref 5.0–8.0)

## 2019-12-17 LAB — ETHANOL: Alcohol, Ethyl (B): <10 mg/dL (ref <10)

## 2019-12-17 LAB — RESPIRATORY PANEL BY RT PCR (FLU A&B, COVID)
Influenza A by PCR: NEGATIVE
Influenza B by PCR: NEGATIVE
SARS Coronavirus 2 by RT PCR: NEGATIVE

## 2019-12-17 LAB — LACTIC ACID, PLASMA: Lactic Acid, Venous: 0.8 mmol/L (ref 0.5–1.9)

## 2019-12-17 LAB — VALPROIC ACID LEVEL: Valproic Acid Lvl: 25 ug/mL — ABNORMAL LOW (ref 50.0–100.0)

## 2019-12-17 MED ORDER — LEVOTHYROXINE SODIUM 50 MCG PO TABS
50.0000 ug | ORAL_TABLET | Freq: Every day | ORAL | Status: DC
  Administered 2019-12-18 – 2020-01-05 (×18): 50 ug via ORAL
  Filled 2019-12-17 (×20): qty 1

## 2019-12-17 MED ORDER — DOXAZOSIN MESYLATE 1 MG PO TABS
1.0000 mg | ORAL_TABLET | Freq: Every day | ORAL | Status: DC
  Administered 2019-12-18 – 2020-01-04 (×18): 1 mg via ORAL
  Filled 2019-12-17 (×18): qty 1

## 2019-12-17 MED ORDER — SODIUM CHLORIDE 0.9 % IV BOLUS
500.0000 mL | Freq: Once | INTRAVENOUS | Status: AC
  Administered 2019-12-17: 500 mL via INTRAVENOUS

## 2019-12-17 MED ORDER — MAGNESIUM OXIDE 400 (241.3 MG) MG PO TABS
400.0000 mg | ORAL_TABLET | Freq: Every day | ORAL | Status: DC
  Administered 2019-12-18 – 2020-01-05 (×19): 400 mg via ORAL
  Filled 2019-12-17 (×19): qty 1

## 2019-12-17 MED ORDER — LORAZEPAM 2 MG/ML IJ SOLN
0.0000 mg | Freq: Four times a day (QID) | INTRAMUSCULAR | Status: AC
  Administered 2019-12-17: 19:00:00 2 mg via INTRAVENOUS
  Filled 2019-12-17: qty 1

## 2019-12-17 MED ORDER — THIAMINE HCL 100 MG/ML IJ SOLN
100.0000 mg | Freq: Every day | INTRAMUSCULAR | Status: DC
  Administered 2019-12-20: 100 mg via INTRAVENOUS
  Filled 2019-12-17 (×2): qty 2

## 2019-12-17 MED ORDER — CYCLOSPORINE 25 MG PO CAPS
150.0000 mg | ORAL_CAPSULE | Freq: Every day | ORAL | Status: DC
  Administered 2019-12-17 – 2020-01-04 (×19): 150 mg via ORAL
  Filled 2019-12-17 (×20): qty 2

## 2019-12-17 MED ORDER — THIAMINE HCL 100 MG PO TABS
100.0000 mg | ORAL_TABLET | Freq: Every day | ORAL | Status: DC
  Administered 2019-12-17 – 2020-01-05 (×19): 100 mg via ORAL
  Filled 2019-12-17 (×20): qty 1

## 2019-12-17 MED ORDER — LORAZEPAM 1 MG PO TABS: 0.0000 mg | ORAL_TABLET | Freq: Two times a day (BID) | ORAL | Status: AC

## 2019-12-17 MED ORDER — MYCOPHENOLATE MOFETIL 250 MG PO CAPS
250.0000 mg | ORAL_CAPSULE | Freq: Two times a day (BID) | ORAL | Status: DC
  Administered 2019-12-17 – 2020-01-05 (×38): 250 mg via ORAL
  Filled 2019-12-17 (×39): qty 1

## 2019-12-17 MED ORDER — AMLODIPINE BESYLATE 5 MG PO TABS: 5.0000 mg | ORAL_TABLET | Freq: Once | ORAL | Status: DC

## 2019-12-17 MED ORDER — FENOFIBRATE 160 MG PO TABS: 160.0000 mg | ORAL_TABLET | Freq: Every day | ORAL | Status: DC

## 2019-12-17 MED ORDER — SODIUM CHLORIDE 0.9 % IV BOLUS
1000.0000 mL | Freq: Once | INTRAVENOUS | Status: AC
  Administered 2019-12-17: 17:00:00 1000 mL via INTRAVENOUS

## 2019-12-17 MED ORDER — LORAZEPAM 2 MG/ML IJ SOLN
1.0000 mg | Freq: Once | INTRAMUSCULAR | Status: AC
  Administered 2019-12-17: 17:00:00 1 mg via INTRAVENOUS
  Filled 2019-12-17: qty 1

## 2019-12-17 MED ORDER — CYCLOSPORINE 25 MG PO CAPS
125.0000 mg | ORAL_CAPSULE | Freq: Every morning | ORAL | Status: DC
  Administered 2019-12-18 – 2020-01-05 (×19): 125 mg via ORAL
  Filled 2019-12-17 (×19): qty 1

## 2019-12-17 MED ORDER — OLANZAPINE 5 MG PO TABS
5.0000 mg | ORAL_TABLET | Freq: Every day | ORAL | Status: DC
  Administered 2019-12-18 – 2020-01-04 (×18): 5 mg via ORAL
  Filled 2019-12-17 (×18): qty 1

## 2019-12-17 MED ORDER — METOPROLOL TARTRATE 50 MG PO TABS
100.0000 mg | ORAL_TABLET | Freq: Two times a day (BID) | ORAL | Status: DC
  Administered 2019-12-18 – 2020-01-05 (×36): 100 mg via ORAL
  Filled 2019-12-17: qty 2
  Filled 2019-12-17: qty 4
  Filled 2019-12-17 (×6): qty 2
  Filled 2019-12-17: qty 4
  Filled 2019-12-17 (×3): qty 2
  Filled 2019-12-17 (×2): qty 4
  Filled 2019-12-17 (×2): qty 2
  Filled 2019-12-17: qty 4
  Filled 2019-12-17 (×3): qty 2
  Filled 2019-12-17 (×2): qty 4
  Filled 2019-12-17: qty 2
  Filled 2019-12-17: qty 4
  Filled 2019-12-17 (×5): qty 2
  Filled 2019-12-17: qty 4
  Filled 2019-12-17 (×4): qty 2
  Filled 2019-12-17: qty 4
  Filled 2019-12-17 (×2): qty 2

## 2019-12-17 MED ORDER — DIVALPROEX SODIUM 250 MG PO DR TAB
500.0000 mg | DELAYED_RELEASE_TABLET | Freq: Every day | ORAL | Status: DC
  Administered 2019-12-17 – 2019-12-22 (×6): 500 mg via ORAL
  Filled 2019-12-17 (×6): qty 1

## 2019-12-17 MED ORDER — LORAZEPAM 2 MG/ML IJ SOLN: 0.0000 mg | Freq: Two times a day (BID) | INTRAMUSCULAR | Status: AC

## 2019-12-17 MED ORDER — ACETAMINOPHEN 325 MG PO TABS
650.0000 mg | ORAL_TABLET | Freq: Once | ORAL | Status: AC
  Administered 2019-12-17: 19:00:00 650 mg via ORAL
  Filled 2019-12-17: qty 2

## 2019-12-17 MED ORDER — LORAZEPAM 1 MG PO TABS
0.0000 mg | ORAL_TABLET | Freq: Four times a day (QID) | ORAL | Status: AC
  Administered 2019-12-18 (×2): 1 mg via ORAL
  Filled 2019-12-17 (×2): qty 1

## 2019-12-17 MED ORDER — OLANZAPINE 2.5 MG PO TABS
2.5000 mg | ORAL_TABLET | Freq: Every day | ORAL | Status: DC
  Administered 2019-12-18 – 2019-12-21 (×4): 2.5 mg via ORAL
  Filled 2019-12-17 (×4): qty 1

## 2019-12-17 NOTE — ED Notes (Signed)
Opal Sidles (son/poa) [phone] please call with questions and updates. Has patients history and info if needed,

## 2019-12-17 NOTE — Progress Notes (Signed)
This patient continues to meet inpatient criteria. CSW faxed information to the following facilities:    [facility], [place] Worker
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
## 2019-12-17 NOTE — ED Triage Notes (Signed)
Per [facility] pt from [place] for making comments of wanting to kill herself by stabbing herself in the chest with scissors. Pt was seen here yesterday for anxiety.

## 2019-12-17 NOTE — Progress Notes (Signed)
CSW faxed COVID results to [facility] MSW, [place] Worker Disposition  [facility] Ph: [phone] Fax: [phone]

## 2019-12-17 NOTE — BH Assessment (Signed)
Tele Assessment Note   Patient Name: Brenda Nolan MRN: [id number] Referring Physician: EDP Location of Patient: [facility] Location of Provider: [place] Department  Brenda Nolan is a 69 y.o. female who presented to [facility] from [place] home with complaint of suicidal ideation and also in apparent altered mental status.  Pt was last assessed by TTS in January 2021.  At that time, she reported anxiety and had altered mental status.  Pt is married and lives at [facility] assisted [facility].  She is staying at [facility] right now due to an infection.  Pt was oriented to name, place, nd situation.  She reported that she came to the hospital because she is suicidal and that she wanted to or did stab herself in the chest with a pair of scissors.  Pt also reported that she is upset because her husband committed suicide.  Pt could not say when her husband killed himself.  Pt also endorsed anxiety, disturbed sleep, and disturbed appetite.  During assessment, Pt stopped answering questions periodically.  Author spoke with Pt's son, who is also listed as her power of attorney.  Per son, Pt is currently being treated for an infection.  Per son, Pt became confused about two days ago, and she is making delusional statements -- for instance, her husband is alive and did not commit suicide.  Son also stated that Pt makes comments about machines giving her a heart attack.    Per notes, Pt is treated for Bipolar I Disorder.  Son stated that Pt attempted suicide once in the late 1970s, and that she may be confused to time.  Pt stated that she receives outpatient psychiatric services through a provider at [facility].  Pt stated that she takes Depakote and that she is compliant.  During assessment, Pt presented as alert to name, place, and situation.  She had good eye contact.  Demeanor was calm.  Pt's mood was reported as anxious.  Affect was anxious and preoccupied.  Pt's speech was normal in  rate, rhythm, and volume.  Thought processes were within normal range, and thought content appeared interrupted, suggesting confusion or thought stopping.  Pt's memory was poor.  Concentration was fair.  Insight, judgment, and impulse control was fair to poor.  Consulted with T. Hall Busing, NP, who determined that Pt meets inpatient criteria.  Diagnosis: Bipolar I Disorder; Anxiety  Past Medical History:  Past Medical History:  Diagnosis Date  . Anxiety 2019  . Bipolar 1 disorder ([place]) 2019  . Chronic kidney disease (CKD)    Stage 1  . Difficulty in walking, not elsewhere classified   . Dysphagia, oropharyngeal phase   . Enterocolitis due to Clostridioides difficile 2019  . Hypothyroidism 2019  . Kidney transplant status   . Muscle weakness   . Urinary tract infection     History reviewed. No pertinent surgical history.  Family History: No family history on file.  Social History:  reports that she has never smoked. She has never used smokeless tobacco. She reports previous alcohol use. She reports that she does not use drugs.  Additional Social History:  Alcohol / Drug Use Pain Medications: See MAR Prescriptions: See MAR Over the Counter: See MAR History of alcohol / drug use?: No history of alcohol / drug abuse  CIWA: CIWA-Ar BP: 121/85 Pulse Rate: 98 COWS:    Allergies:  Allergies  Allergen Reactions  . Atorvastatin Other (See Comments)    Myalgias  . Ketoconazole Rash  .  Abilify [Aripiprazole] Other (See Comments)    Unknown  . Ibuprofen Other (See Comments)    Unknown  . Levaquin [Levofloxacin] Other (See Comments)    Unknown  . Sulfa Antibiotics Other (See Comments)    Unknown  . Sulfamethoxazole Other (See Comments)    Unknown    Home Medications: (Not in a hospital admission)   OB/GYN Status:  No LMP recorded (lmp unknown).  General Assessment Data Location of Assessment: [facility] TTS Assessment: In system Is this a Tele or Face-to-Face Assessment?: Tele  Assessment Is this an Initial Assessment or a Re-assessment for this encounter?: Initial Assessment Patient Accompanied by:: N/A Language Other than English: No Living Arrangements: In Assisted Living/Nursing Home (Comment: Name of Nursing Home([facility]) What gender do you identify as?: Female Marital status: Married Pregnancy Status: No Living Arrangements: (Nursing home) Can pt return to current living arrangement?: Yes Admission Status: Voluntary Is patient capable of signing voluntary admission?: Yes Referral Source: Other Insurance type: None     Crisis Care Plan Living Arrangements: (Nursing home) Name of Psychiatrist: psychiatrist at [facility] Name of Therapist: none  Education Status Is patient currently in school?: No Is the patient employed, unemployed or receiving disability?: Unemployed  Risk to self with the past 6 months Suicidal Ideation: Yes-Currently Present Has patient been a risk to self within the past 6 months prior to admission? : No Suicidal Intent: No Has patient had any suicidal intent within the past 6 months prior to admission? : No Is patient at risk for suicide?: (See notes) Suicidal Plan?: No(See notes) Has patient had any suicidal plan within the past 6 months prior to admission? : No What has been your use of drugs/alcohol within the last 12 months?: Denied Previous Attempts/Gestures: Yes How many times?: 1 Intentional Self Injurious Behavior: None Family Suicide History: No Recent stressful life event(s): Recent negative physical changes Persecutory voices/beliefs?: No Depression: Yes Depression Symptoms: Despondent Substance abuse history and/or treatment for substance abuse?: No Suicide prevention information given to non-admitted patients: Not applicable  Risk to Others within the past 6 months Homicidal Ideation: No Does patient have any lifetime risk of violence toward others beyond the six months prior to  admission? : No Thoughts of Harm to Others: No Current Homicidal Intent: No Current Homicidal Plan: No Access to Homicidal Means: No History of harm to others?: No Assessment of Violence: None Noted Does patient have access to weapons?: No Criminal Charges Pending?: No Does patient have a court date: No Is patient on probation?: No  Psychosis Hallucinations: None noted Delusions: None noted  Mental Status Report Appearance/Hygiene: Unremarkable Eye Contact: Good Motor Activity: Freedom of movement, Unremarkable Speech: Slow, Incoherent Level of Consciousness: Alert Mood: Apprehensive, Preoccupied Affect: Appropriate to circumstance Anxiety Level: Moderate Thought Processes: Irrelevant Judgement: Partial Orientation: Person, Place, Situation, Time Obsessive Compulsive Thoughts/Behaviors: None  Cognitive Functioning Concentration: Poor Memory: Recent Impaired, Remote Impaired Is patient IDD: No Insight: Poor Impulse Control: Fair Appetite: Fair Have you had any weight changes? : No Change Sleep: Decreased Vegetative Symptoms: None  ADLScreening [facility] Assessment Services) Patient's cognitive ability adequate to safely complete daily activities?: Yes Patient able to express need for assistance with ADLs?: Yes Independently performs ADLs?: Yes (appropriate for developmental age)  Prior Inpatient Therapy Prior Inpatient Therapy: Yes Prior Therapy Dates: 2020 Prior Therapy Facilty/Provider(s): [facility] Reason for Treatment: Alterd mental status  Prior Outpatient Therapy Prior Outpatient Therapy: Yes Prior Therapy Dates: Ongoing Prior Therapy Facilty/Provider(s): Unsure -- provider is psychiatrist  at [facility] Reason for Treatment: Bipolar Does patient have an ACCT team?: No Does patient have Intensive In-House Services?  : No Does patient have [facility] services? : No Does patient have P4CC services?: No  ADL Screening (condition at time of admission) Patient's cognitive  ability adequate to safely complete daily activities?: Yes Is the patient deaf or have difficulty hearing?: No Does the patient have difficulty seeing, even when wearing glasses/contacts?: No Does the patient have difficulty concentrating, remembering, or making decisions?: No Patient able to express need for assistance with ADLs?: Yes Does the patient have difficulty dressing or bathing?: No Independently performs ADLs?: Yes (appropriate for developmental age) Does the patient have difficulty walking or climbing stairs?: No Weakness of Legs: None Weakness of Arms/Hands: None  Home Assistive Devices/Equipment Home Assistive Devices/Equipment: None  Therapy Consults (therapy consults require a physician order) PT Evaluation Needed: No OT Evalulation Needed: No SLP Evaluation Needed: No Abuse/Neglect Assessment (Assessment to be complete while patient is alone) Abuse/Neglect Assessment Can Be Completed: Unable to assess, patient is non-responsive or altered mental status Values / Beliefs Cultural Requests During Hospitalization: None Spiritual Requests During Hospitalization: None Consults Spiritual Care Consult Needed: No Transition of Care Team Consult Needed: No Advance Directives (For Healthcare) Does Patient Have a Medical Advance Directive?: Yes Type of Advance Directive: Healthcare Power of Attorney          Disposition:  Disposition Initial Assessment Completed for this Encounter: Yes Disposition of Patient: Admit Type of inpatient treatment program: (Per T. Hall Busing, NP, Pt meets inpt criteria)  This service was provided via telemedicine using a 2-way, interactive audio and video technology.  Names of all persons participating in this telemedicine service and their role in this encounter. Name: Brenda Nolan Role: Patient  Name: Brenda Nolan Role: Son          Marlowe Aschoff 12/17/2019 3:43 PM
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
## 2019-12-18 ENCOUNTER — Encounter (HOSPITAL_COMMUNITY): Payer: Self-pay | Admitting: Registered Nurse

## 2019-12-18 ENCOUNTER — Emergency Department (HOSPITAL_COMMUNITY): Payer: Medicare Other

## 2019-12-18 DIAGNOSIS — R45851 Suicidal ideations: Secondary | ICD-10-CM | POA: Insufficient documentation

## 2019-12-18 DIAGNOSIS — F319 Bipolar disorder, unspecified: Secondary | ICD-10-CM | POA: Diagnosis not present

## 2019-12-18 LAB — URINE CULTURE: Culture: NO GROWTH

## 2019-12-18 NOTE — ED Notes (Signed)
Pt alert and confused. Slurred speech at times, rambling at times. Pt can walk with stand by assist and walker. Pt can feed self. Pt incontinent at times.  Cooperative with care and redirection

## 2019-12-18 NOTE — ED Notes (Signed)
Patient asleep at this time dressed in her own clothing (Will ask patient to dress out once awake). Trash can removed from room and cabinets locked. Sitter at door.

## 2019-12-18 NOTE — BH Assessment (Addendum)
[facility] Assessment Progress Note  Per Hampton Abbot, MD, this pt requires psychiatric hospitalization at a facility providing specialty geriatric services.  Dr Dwyane Dee also finds that pt meets criteria for IVC, which she has initiated.  IVC documents have been faxed to [facility], and at [employer] confirms receipt.  He has since faxed Findings and Custody Order to this Probation officer.  At 11:35 I called [employer] and spoke to [employer], who took demographic information, agreeing to dispatch law enforcement to fill out Return of Service.  As of this writing arrival of law enforcement is pending.  This Probation officer will seek placement for pt.  Brenda Nolan, [place] [phone]   Addendum:  The following facilities have been contacted to seek placement for this pt, with results as noted:  Beds available, information sent, decision pending: [place]: [facility] (due to recent VRE UTI) [facility] (due to ADL needs)  Unable to reach: [facility]  Not referred: [facility] (need for assistance with ADL's is exclusionary) [facility] (need for assistance with ADL's is exclusionary)  At capacity: [facility] (closed due to Covid-19 for foreseeable future) [facility] (closed due to renovation) [facility] (not accepting outside referrals to geriatric unity)   Brenda Nolan, [place] Coordinator [phone]

## 2019-12-18 NOTE — BH Assessment (Signed)
[place] [facility] Assessment Progress Note  Per pt's nurse, Beth, pt has signed Consent to Release Information to her son, Opal Sidles [id number]), whom she also identifies as her health care power of attorney.  This Probation officer reviewed pt's chart and was unable to find this document in pt's EPIC record.  At 16:00 I called Mr Juleen China.  He confirms that he is pt's HCPOA, and agrees to send the document to me.  I informed him that our psychiatry team has found that pt needs psychiatric hospitalization for short-term crisis stabilization at this time, and that she has been placed under IVC due to her impaired judgment and current danger to self.  I informed him that beds on geriatric psychiatry units are currently difficult to find, but that I will continue to search throughout [facility].  I also informed him that our psychiatry team will continue to round on the pt on a daily basis, and will work toward stabilizing her in the ED in case an appropriate bed cannot be found.  Mr Juleen China understands what I have explained.  I have provided him with my own name and phone number, as well as pt's current room number and the phone number for the nursing station where pt is located, along with the name of her current nurse.  HCPOA has since been received.  A copy has been placed on the pt's chart, a copy has been labeled for Medical Records, and I have kept a copy to send to prospective facilities when I refer her out.  Jalene Mullet, [place] Coordinator [phone]

## 2019-12-18 NOTE — ED Notes (Signed)
Tele-psych at bedside.

## 2019-12-18 NOTE — Consult Note (Signed)
Telepsych Consultation   Reason for Consult:  Suicide ideation Referring Physician:  Dirk Dress EDP Location of Patient: [facility] Location of Provider: [facility]  Patient Identification: Brenda Nolan MRN:  [id number] Principal Diagnosis: Bipolar 1 disorder, depressed ([place]) Diagnosis:  Principal Problem:   Bipolar 1 disorder, depressed ([place])   Total Time spent with patient: 30 minutes  Subjective:   Brenda Nolan is a 69 y.o. female patient presented to the ED after making statements of wanting to kill herself via stabbing herself with scissors.  Patient was also seen previous day for anxiety.  HPI:  Brenda Nolan, 69 y.o., female patient seen via tele psych by this provider, Dr. Dwyane Dee; and chart reviewed on 12/18/19.  On evaluation Brenda Nolan reports "I tried to commit suicide.  I don't wanna live anymore.  Nobody cares about me."  Patient states that she is not close to any of her family.  Patient also states that she has been seeing the devil in her room.  "I don't want to see the devil no more."   During evaluation [name] is alert/oriented x person and place.  She was calm/cooperative through out assessment but not a good historian.  Patient would have to be asked questions several times and at time would have to have nurse tech to ask question again.  Patient would not answer all questions; possible some thought blocking.  Valproic Acid level      Past Psychiatric History: Bipolar 1 disorder  Risk to Self: Suicidal Ideation: Yes-Currently Present Suicidal Intent: No Is patient at risk for suicide?: (See notes) Suicidal Plan?: No(See notes) What has been your use of drugs/alcohol within the last 12 months?: Denied How many times?: 1 Intentional Self Injurious Behavior: None Risk to Others: Homicidal Ideation: No Thoughts of Harm to Others: No Current Homicidal Intent: No Current Homicidal Plan: No Access to Homicidal Means: No History of harm to others?:  No Assessment of Violence: None Noted Does patient have access to weapons?: No Criminal Charges Pending?: No Does patient have a court date: No Prior Inpatient Therapy: Prior Inpatient Therapy: Yes Prior Therapy Dates: 2020 Prior Therapy Facilty/Provider(s): [facility] Reason for Treatment: [place] mental status Prior Outpatient Therapy: Prior Outpatient Therapy: Yes Prior Therapy Dates: Ongoing Prior Therapy Facilty/Provider(s): Unsure -- provider is psychiatrist at [facility] Reason for Treatment: Bipolar Does patient have an ACCT team?: No Does patient have Intensive In-House Services?  : No Does patient have [facility] services? : No Does patient have P4CC services?: No  Past Medical History:  Past Medical History:  Diagnosis Date  . Anxiety 2019  . Bipolar 1 disorder ([place]) 2019  . Chronic kidney disease (CKD)    Stage 1  . Difficulty in walking, not elsewhere classified   . Dysphagia, oropharyngeal phase   . Enterocolitis due to Clostridioides difficile 2019  . Hypothyroidism 2019  . Kidney transplant status   . Muscle weakness   . Urinary tract infection    History reviewed. No pertinent surgical history. Family History: History reviewed. No pertinent family history. Family Psychiatric  History: unaware Social History:  Social History   Substance and Sexual Activity  Alcohol Use Not Currently     Social History   Substance and Sexual Activity  Drug Use Never    Social History   Socioeconomic History  . Marital status: Married    Spouse name: Not on file  . Number of children: Not on file  . Years of education: Not on file  . Highest education  level: Not on file  Occupational History  . Occupation: Retired  Tobacco Use  . Smoking status: Never Smoker  . Smokeless tobacco: Never Used  Substance and Sexual Activity  . Alcohol use: Not Currently  . Drug use: Never  . Sexual activity: Not Currently  Other Topics Concern  . Not on file  Social History Narrative    Pt currently resides at [facility] where she is being treated for an infection.  Pt receives outpatient psychiatric services through a provider at [facility].   Social Determinants of Health   Financial Resource Strain:   . Difficulty of Paying Living Expenses: Not on file  Food Insecurity:   . Worried About Charity fundraiser in the Last Year: Not on file  . Ran Out of Food in the Last Year: Not on file  Transportation Needs:   . Lack of Transportation (Medical): Not on file  . Lack of Transportation (Non-Medical): Not on file  Physical Activity:   . Days of Exercise per Week: Not on file  . Minutes of Exercise per Session: Not on file  Stress:   . Feeling of Stress : Not on file  Social Connections:   . Frequency of Communication with Friends and Family: Not on file  . Frequency of Social Gatherings with Friends and Family: Not on file  . Attends Religious Services: Not on file  . Active Member of Clubs or Organizations: Not on file  . Attends Archivist Meetings: Not on file  . Marital Status: Not on file   Additional Social History:    Allergies:   Allergies  Allergen Reactions  . Atorvastatin Other (See Comments)    Myalgias  . Ketoconazole Rash  . Abilify [Aripiprazole] Other (See Comments)    Unknown  . Ibuprofen Other (See Comments)    Unknown  . Levaquin [Levofloxacin] Other (See Comments)    Unknown  . Sulfa Antibiotics Other (See Comments)    Unknown  . Sulfamethoxazole Other (See Comments)    Unknown    Labs:  Results for orders placed or performed during the hospital encounter of 12/17/19 (from the past 48 hour(s))  Respiratory Panel by RT PCR (Flu A&B, Covid) - Nasopharyngeal Swab     Status: None   Collection Time: 12/17/19  2:37 PM   Specimen: Nasopharyngeal Swab  Result Value Ref Range   SARS Coronavirus 2 by RT PCR NEGATIVE NEGATIVE    Comment: (NOTE) SARS-CoV-2 target nucleic acids are NOT DETECTED. The SARS-CoV-2 RNA is generally  detectable in upper respiratoy specimens during the acute phase of infection. The lowest concentration of SARS-CoV-2 viral copies this assay can detect is 131 copies/mL. A negative result does not preclude SARS-Cov-2 infection and should not be used as the sole basis for treatment or other patient management decisions. A negative result may occur with  improper specimen collection/handling, submission of specimen other than nasopharyngeal swab, presence of viral mutation(s) within the areas targeted by this assay, and inadequate number of viral copies (<131 copies/mL). A negative result must be combined with clinical observations, patient history, and epidemiological information. The expected result is Negative. Fact Sheet for Patients:  PinkCheek.be Fact Sheet for Healthcare Providers:  GravelBags.it This test is not yet ap proved or cleared by the [place] FDA and  has been authorized for detection and/or diagnosis of SARS-CoV-2 by FDA under an Emergency Use Authorization (EUA). This EUA will remain  in effect (meaning this test can be used) for the duration  of the COVID-19 declaration under Section 564(b)(1) of the Act, 21 U.S.C. section 360bbb-3(b)(1), unless the authorization is terminated or revoked sooner.    Influenza A by PCR NEGATIVE NEGATIVE   Influenza B by PCR NEGATIVE NEGATIVE    Comment: (NOTE) The Xpert Xpress SARS-CoV-2/FLU/RSV assay is intended as an aid in  the diagnosis of influenza from Nasopharyngeal swab specimens and  should not be used as a sole basis for treatment. Nasal washings and  aspirates are unacceptable for Xpert Xpress SARS-CoV-2/FLU/RSV  testing. Fact Sheet for Patients: PinkCheek.be Fact Sheet for Healthcare Providers: GravelBags.it This test is not yet approved or cleared by the [place] FDA and  has been authorized for  detection and/or diagnosis of SARS-CoV-2 by  FDA under an Emergency Use Authorization (EUA). This EUA will remain  in effect (meaning this test can be used) for the duration of the  Covid-19 declaration under Section 564(b)(1) of the Act, 21  U.S.C. section 360bbb-3(b)(1), unless the authorization is  terminated or revoked. Performed at [facility], [place] [address], [place], [place] [address]   Ethanol     Status: None   Collection Time: 12/17/19  3:05 PM  Result Value Ref Range   Alcohol, Ethyl (B) <10 <10 mg/dL    Comment: (NOTE) Lowest detectable limit for serum alcohol is 10 mg/dL. For medical purposes only. Performed at [facility], [place] [address] [place], [place] [address]   Comprehensive metabolic panel     Status: Abnormal   Collection Time: 12/17/19  3:05 PM  Result Value Ref Range   Sodium 140 135 - 145 mmol/L   Potassium 4.4 3.5 - 5.1 mmol/L   Chloride 113 (H) 98 - 111 mmol/L   CO2 18 (L) 22 - 32 mmol/L   Glucose, Bld 93 70 - 99 mg/dL   BUN 33 (H) 8 - 23 mg/dL   Creatinine, Ser 1.35 (H) 0.44 - 1.00 mg/dL   Calcium 9.5 8.9 - 10.3 mg/dL   Total Protein 7.1 6.5 - 8.1 g/dL   Albumin 3.6 3.5 - 5.0 g/dL   AST 15 15 - 41 U/L   ALT 8 0 - 44 U/L   Alkaline Phosphatase 29 (L) 38 - 126 U/L   Total Bilirubin 0.6 0.3 - 1.2 mg/dL   GFR calc non Af Amer 41 (L) >60 mL/min   GFR calc Af Amer 47 (L) >60 mL/min   Anion gap 9 5 - 15    Comment: Performed at [facility], [place] [address], [place], [place] [address]  CBC with Differential     Status: Abnormal   Collection Time: 12/17/19  3:05 PM  Result Value Ref Range   WBC 13.4 (H) 4.0 - 10.5 K/uL   RBC 4.18 3.87 - 5.11 MIL/uL   Hemoglobin 13.0 12.0 - 15.0 g/dL   HCT 39.5 36.0 - 46.0 %   MCV 94.5 80.0 - 100.0 fL   MCH 31.1 26.0 - 34.0 pg   MCHC 32.9 30.0 - 36.0 g/dL   RDW 12.5 11.5 - 15.5 %   Platelets 295 150 - 400 K/uL   nRBC 0.0 0.0 - 0.2 %   Neutrophils Relative  % 75 %   Neutro Abs 10.1 (H) 1.7 - 7.7 K/uL   Lymphocytes Relative 15 %   Lymphs Abs 1.9 0.7 - 4.0 K/uL   Monocytes Relative 8 %   Monocytes Absolute 1.1 (H) 0.1 - 1.0 K/uL   Eosinophils Relative 1 %  Eosinophils Absolute 0.1 0.0 - 0.5 K/uL   Basophils Relative 0 %   Basophils Absolute 0.1 0.0 - 0.1 K/uL   Immature Granulocytes 1 %   Abs Immature Granulocytes 0.09 (H) 0.00 - 0.07 K/uL    Comment: Performed at [facility], [place] [address], [place], [place] [address]  Valproic acid level     Status: Abnormal   Collection Time: 12/17/19  3:38 PM  Result Value Ref Range   Valproic Acid Lvl 25 (L) 50.0 - 100.0 ug/mL    Comment: Performed at [facility], [place] [address] [place], [place] [address]  Lactic acid, plasma     Status: None   Collection Time: 12/17/19 10:32 PM  Result Value Ref Range   Lactic Acid, Venous 0.8 0.5 - 1.9 mmol/L    Comment: Performed at [facility], [place] [address], [place], [place] [address]  Urinalysis, Routine w reflex microscopic     Status: Abnormal   Collection Time: 12/17/19 10:50 PM  Result Value Ref Range   Color, Urine YELLOW YELLOW   APPearance CLEAR CLEAR   Specific Gravity, Urine 1.019 1.005 - 1.030   pH 5.0 5.0 - 8.0   Glucose, UA 50 (A) NEGATIVE mg/dL   Hgb urine dipstick NEGATIVE NEGATIVE   Bilirubin Urine NEGATIVE NEGATIVE   Ketones, ur NEGATIVE NEGATIVE mg/dL   Protein, ur NEGATIVE NEGATIVE mg/dL   Nitrite NEGATIVE NEGATIVE   Leukocytes,Ua NEGATIVE NEGATIVE    Comment: Performed at [place] [address], [place], [place] [address]  Urine rapid drug screen (hosp performed)     Status: Abnormal   Collection Time: 12/17/19 10:50 PM  Result Value Ref Range   Opiates NONE DETECTED NONE DETECTED   Cocaine NONE DETECTED NONE DETECTED   Benzodiazepines POSITIVE (A) NONE DETECTED   Amphetamines NONE DETECTED NONE DETECTED   Tetrahydrocannabinol NONE DETECTED  NONE DETECTED   Barbiturates NONE DETECTED NONE DETECTED    Comment: (NOTE) DRUG SCREEN FOR MEDICAL PURPOSES ONLY.  IF CONFIRMATION IS NEEDED FOR ANY PURPOSE, NOTIFY LAB WITHIN 5 DAYS. LOWEST DETECTABLE LIMITS FOR URINE DRUG SCREEN Drug Class                     Cutoff (ng/mL) Amphetamine and metabolites    1000 Barbiturate and metabolites    200 Benzodiazepine                 [id number] Tricyclics and metabolites     300 Opiates and metabolites        300 Cocaine and metabolites        300 THC                            50 Performed at [facility], [place] [address], [place], [place] [address]     Medications:  Current Facility-Administered Medications  Medication Dose Route Frequency Provider Last Rate Last Admin  . amLODipine (NORVASC) tablet 5 mg  5 mg Oral Once Couture, Cortni S, PA-C   Stopped at 12/17/19 2358  . cycloSPORINE (SANDIMMUNE) capsule 125 mg  125 mg Oral q morning - 10a Couture, Cortni S, PA-C   125 mg at 12/18/19 0914  . cycloSPORINE (SANDIMMUNE) capsule 150 mg  150 mg Oral QHS Couture, Cortni S, PA-C   150 mg at 12/17/19 2358  . divalproex (DEPAKOTE) DR tablet 500 mg  500 mg Oral QHS Couture,  Cortni S, PA-C   500 mg at 12/17/19 2358  . doxazosin (CARDURA) tablet 1 mg  1 mg Oral QHS Couture, Cortni S, PA-C      . levothyroxine (SYNTHROID) tablet 50 mcg  50 mcg Oral Q0600 Couture, Cortni S, PA-C   50 mcg at 12/18/19 0802  . LORazepam (ATIVAN) injection 0-4 mg  0-4 mg Intravenous Q6H Couture, Cortni S, PA-C   Stopped at 12/18/19 0016   Or  . LORazepam (ATIVAN) tablet 0-4 mg  0-4 mg Oral Q6H Couture, Cortni S, PA-C      . [START ON 12/20/2019] LORazepam (ATIVAN) injection 0-4 mg  0-4 mg Intravenous Q12H Couture, Cortni S, PA-C       Or  . [START ON 12/20/2019] LORazepam (ATIVAN) tablet 0-4 mg  0-4 mg Oral Q12H Couture, Cortni S, PA-C      . magnesium oxide (MAG-OX) tablet 400 mg  400 mg Oral Daily Couture, Cortni S, PA-C   400 mg at 12/18/19 0912  .  metoprolol tartrate (LOPRESSOR) tablet 100 mg  100 mg Oral BID Couture, Cortni S, PA-C   100 mg at 12/18/19 0913  . mycophenolate (CELLCEPT) capsule 250 mg  250 mg Oral BID Couture, Cortni S, PA-C   250 mg at 12/18/19 0913  . OLANZapine (ZYPREXA) tablet 2.5 mg  2.5 mg Oral Daily Couture, Cortni S, PA-C   2.5 mg at 12/18/19 0925  . OLANZapine (ZYPREXA) tablet 5 mg  5 mg Oral QHS Couture, Cortni S, PA-C      . thiamine tablet 100 mg  100 mg Oral Daily Couture, Cortni S, PA-C   100 mg at 12/18/19 [id number]   Or  . thiamine (B-1) injection 100 mg  100 mg Intravenous Daily Couture, Cortni S, PA-C       Current Outpatient Medications  Medication Sig Dispense Refill  . amLODipine (NORVASC) 5 MG tablet Take 5 mg by mouth daily.    . clonazePAM (KLONOPIN) 1 MG tablet Take 1 tablet (1 mg total) by mouth daily as needed for up to 10 doses (For nervousness). 10 tablet 0  . cycloSPORINE (SANDIMMUNE) 25 MG capsule Take 125 mg by mouth daily.    . cycloSPORINE (SANDIMMUNE) 25 MG capsule Take 150 mg by mouth at bedtime.    . divalproex (DEPAKOTE ER) 500 MG 24 hr tablet Take 500 mg by mouth at bedtime.    Marland Kitchen doxazosin (CARDURA) 1 MG tablet Take 1 mg by mouth at bedtime.    . fenofibrate 160 MG tablet Take 160 mg by mouth daily.    Marland Kitchen levothyroxine (SYNTHROID) 50 MCG tablet Take 50 mcg by mouth daily before breakfast.    . LORazepam (ATIVAN) 1 MG tablet Take 1 tablet (1 mg total) by mouth 3 (three) times daily as needed for anxiety. 30 tablet 0  . Magnesium 400 MG TABS Take 1 tablet by mouth daily.    . megestrol (MEGACE) 40 MG/ML suspension Take 20 mg by mouth daily.    . metoprolol tartrate (LOPRESSOR) 100 MG tablet Take 100 mg by mouth 2 (two) times daily.    . Multiple Vitamin (MULTIVITAMIN) tablet Take 1 tablet by mouth daily.    . mycophenolate (CELLCEPT) 250 MG capsule Take 250 mg by mouth 2 (two) times daily.    Marland Kitchen OLANZapine (ZYPREXA) 2.5 MG tablet Take 1 tablet (2.5 mg total) by mouth daily. 30 tablet 0  .  OLANZapine (ZYPREXA) 5 MG tablet Take 1 tablet (5 mg total) by mouth at bedtime.  30 tablet 0    Musculoskeletal: Strength & Muscle Tone: Unable to determine via tele psych [place]: Did not see patient ambulate Patient leans: N/A  Psychiatric Specialty Exam: Physical Exam  Review of Systems  Blood pressure 109/65, pulse (!) 118, temperature 98.7 F (37.1 C), temperature source Oral, resp. rate (!) 25, SpO2 90 %.There is no height or weight on file to calculate BMI.  General Appearance: Casual  Eye Contact:  Fair  Speech:  Blocked, Clear and Coherent and Slow  Volume:  Decreased  Mood:  Depressed and Hopeless  Affect:  Depressed and Flat  Thought Process:  Coherent  Orientation:  Other:  Person and place  Thought Content:  Hallucinations: Visual  Suicidal Thoughts:  Yes.  with intent/plan  Homicidal Thoughts:  No  Memory:  Immediate;   Poor Recent;   Poor  Judgement:  Impaired  Insight:  Lacking  Psychomotor Activity:  Decreased  Concentration:  Concentration: Poor and Attention Span: Poor  Recall:  Poor  Fund of Knowledge:  Fair  Language:  Fair  Akathisia:  No  Handed:  Right  AIMS (if indicated):     Assets:  Communication Skills Desire for Improvement Housing  ADL's:  Impaired  Cognition:  Impaired,  Mild  Sleep:        Treatment Plan Summary: Daily contact with patient to assess and evaluate symptoms and progress in treatment, Medication management and Plan Inpatient treatment geropsychiatric   Disposition: Recommend psychiatric Inpatient admission when medically cleared.  This service was provided via telemedicine using a 2-way, interactive audio and video technology.  Names of all persons participating in this telemedicine service and their role in this encounter. Name: Earleen Newport Role: NP  Name: Dr. Dwyane Dee Role: Psychiatrist  Name: [name] Role: Patient  Name:  Role:     Earleen Newport, NP 12/18/2019 11:19 AM

## 2019-12-18 NOTE — ED Notes (Signed)
Pt to room 29. Pt oriented to unit. Pt calm and cooperative.  Pt resting in bed comfortably.

## 2019-12-18 NOTE — ED Notes (Signed)
Patient given meal tray and repositioned in bed.

## 2019-12-18 NOTE — ED Notes (Addendum)
Sitter at bedside.

## 2019-12-19 DIAGNOSIS — Z751 Person awaiting admission to adequate facility elsewhere: Secondary | ICD-10-CM | POA: Insufficient documentation

## 2019-12-19 DIAGNOSIS — R45851 Suicidal ideations: Secondary | ICD-10-CM | POA: Diagnosis not present

## 2019-12-19 DIAGNOSIS — F319 Bipolar disorder, unspecified: Secondary | ICD-10-CM | POA: Diagnosis not present

## 2019-12-19 LAB — RESPIRATORY PANEL BY RT PCR (FLU A&B, COVID)
Influenza A by PCR: NEGATIVE
Influenza B by PCR: NEGATIVE
SARS Coronavirus 2 by RT PCR: NEGATIVE

## 2019-12-19 MED ORDER — ACETAMINOPHEN 500 MG PO TABS
1000.0000 mg | ORAL_TABLET | Freq: Once | ORAL | Status: AC
  Administered 2019-12-19: 1000 mg via ORAL
  Filled 2019-12-19: qty 2

## 2019-12-19 NOTE — BH Assessment (Signed)
[facility] Assessment Progress Note  Per Shuvon Rankin, FNP, this pt continues to require psychiatric hospitalization at this time.  At 11:34 I called pt's son/HCPOA, Brenda Nolan, updating him on pt's status.  I will continue to seek placement for pt in a specialty geriatric psychiatry facility.  Brenda Nolan, [place] Coordinator [phone]

## 2019-12-19 NOTE — ED Notes (Signed)
Patient OOB at this time, ambulating with SBA and walker to bathroom.

## 2019-12-19 NOTE — BH Assessment (Signed)
[facility] Assessment Progress Note  Per Shuvon Rankin, FNP, this pt continues to require psychiatric hospitalization at this time.  Pt remains under IVC initiated by Hampton Abbot, MD.  The following facilities have been contacted to seek placement for this pt, with results as noted:  Beds available, information sent, decision pending: [facility] (referral sent on 2/1; review is pending) [facility]  Declined: [facility] (due to medical acuity)  At capacity: [facility] (referral sent 2/1; at capacity today) [place], [place] Coordinator [phone]

## 2019-12-19 NOTE — BH Assessment (Signed)
Bill (Triage Nurse) from [facility] has declined patient due to medical acuity. States that her QTC's are not stable.

## 2019-12-19 NOTE — Progress Notes (Signed)
Patient's sister, Sherrell Puller, called TCU to get an update on patient. CSW spoke with patient and patient stated she does not want to release any information to her sister or speak with her sister.

## 2019-12-19 NOTE — Consult Note (Signed)
[name] Psych ED Progress Note  12/19/2019 2:10 PM Brenda Nolan  MRN:  [id number]   Subjective:  Brenda Nolan, 69 y.o., female patient seen via tele psych by this provider, Dr. Dwyane Dee; and chart reviewed on 12/19/19.  On evaluation Brenda Nolan reports that Dr. Erling Cruz told her to say she was suicidal.  Then states that it has been a while since she has seen Dr. Erling Cruz.  Patient states that she has a diagnosis of bipolar disorder.  Patient is aware that she lives in a nursing facility but doesn't want to go back.  Patient states that they (staff) at the nursing facility was trying to kill her "They were giving Ambien that's not good; why I came to the hospital."  Patient then looked at the doorway to room and said "The devil is after me" point at her nurse standing in the doorway of room. Patient continues to be somewhat disorganized and delusional.  Will continue to seek geropsychiatric hospitalization.        Principal Problem: Bipolar 1 disorder, depressed ([place]) Diagnosis:  Principal Problem:   Bipolar 1 disorder, depressed ([place])  Total Time spent with patient: 20 minutes  Past Psychiatric History: bipolar 1 disorder  Past Medical History:  Past Medical History:  Diagnosis Date  . Anxiety 2019  . Bipolar 1 disorder ([place]) 2019  . Chronic kidney disease (CKD)    Stage 1  . Difficulty in walking, not elsewhere classified   . Dysphagia, oropharyngeal phase   . Enterocolitis due to Clostridioides difficile 2019  . Hypothyroidism 2019  . Kidney transplant status   . Muscle weakness   . Urinary tract infection    History reviewed. No pertinent surgical history. Family History: History reviewed. No pertinent family history. Family Psychiatric  History: unaware Social History:  Social History   Substance and Sexual Activity  Alcohol Use Not Currently     Social History   Substance and Sexual Activity  Drug Use Never    Social History   Socioeconomic History  . Marital status: Married     Spouse name: Not on file  . Number of children: Not on file  . Years of education: Not on file  . Highest education level: Not on file  Occupational History  . Occupation: Retired  Tobacco Use  . Smoking status: Never Smoker  . Smokeless tobacco: Never Used  Substance and Sexual Activity  . Alcohol use: Not Currently  . Drug use: Never  . Sexual activity: Not Currently  Other Topics Concern  . Not on file  Social History Narrative   Pt currently resides at [facility] where she is being treated for an infection.  Pt receives outpatient psychiatric services through a provider at [facility].   Social Determinants of Health   Financial Resource Strain:   . Difficulty of Paying Living Expenses: Not on file  Food Insecurity:   . Worried About Charity fundraiser in the Last Year: Not on file  . Ran Out of Food in the Last Year: Not on file  Transportation Needs:   . Lack of Transportation (Medical): Not on file  . Lack of Transportation (Non-Medical): Not on file  Physical Activity:   . Days of Exercise per Week: Not on file  . Minutes of Exercise per Session: Not on file  Stress:   . Feeling of Stress : Not on file  Social Connections:   . Frequency of Communication with Friends and Family: Not on file  . Frequency of  Social Gatherings with Friends and Family: Not on file  . Attends Religious Services: Not on file  . Active Member of Clubs or Organizations: Not on file  . Attends Archivist Meetings: Not on file  . Marital Status: Not on file    Sleep: Good  Appetite:  Fair  Current Medications: Current Facility-Administered Medications  Medication Dose Route Frequency Provider Last Rate Last Admin  . amLODipine (NORVASC) tablet 5 mg  5 mg Oral Once Couture, Cortni S, PA-C   Stopped at 12/17/19 2358  . cycloSPORINE (SANDIMMUNE) capsule 125 mg  125 mg Oral q morning - 10a Couture, Cortni S, PA-C   125 mg at 12/19/19 [id number]  . cycloSPORINE (SANDIMMUNE) capsule 150  mg  150 mg Oral QHS Couture, Cortni S, PA-C   150 mg at 12/18/19 2138  . divalproex (DEPAKOTE) DR tablet 500 mg  500 mg Oral QHS Couture, Cortni S, PA-C   500 mg at 12/18/19 2140  . doxazosin (CARDURA) tablet 1 mg  1 mg Oral QHS Couture, Cortni S, PA-C   1 mg at 12/18/19 2140  . levothyroxine (SYNTHROID) tablet 50 mcg  50 mcg Oral Q0600 Couture, Cortni S, PA-C   Stopped at 12/19/19 [id number]  . LORazepam (ATIVAN) injection 0-4 mg  0-4 mg Intravenous Q6H Couture, Cortni S, PA-C   Stopped at 12/18/19 0016   Or  . LORazepam (ATIVAN) tablet 0-4 mg  0-4 mg Oral Q6H Couture, Cortni S, PA-C   1 mg at 12/18/19 1819  . [START ON 12/20/2019] LORazepam (ATIVAN) injection 0-4 mg  0-4 mg Intravenous Q12H Couture, Cortni S, PA-C       Or  . [START ON 12/20/2019] LORazepam (ATIVAN) tablet 0-4 mg  0-4 mg Oral Q12H Couture, Cortni S, PA-C      . magnesium oxide (MAG-OX) tablet 400 mg  400 mg Oral Daily Couture, Cortni S, PA-C   400 mg at 12/19/19 0937  . metoprolol tartrate (LOPRESSOR) tablet 100 mg  100 mg Oral BID Couture, Cortni S, PA-C   100 mg at 12/19/19 [id number]  . mycophenolate (CELLCEPT) capsule 250 mg  250 mg Oral BID Couture, Cortni S, PA-C   250 mg at 12/19/19 [id number]  . OLANZapine (ZYPREXA) tablet 2.5 mg  2.5 mg Oral Daily Couture, Cortni S, PA-C   2.5 mg at 12/19/19 [id number]  . OLANZapine (ZYPREXA) tablet 5 mg  5 mg Oral QHS Couture, Cortni S, PA-C   5 mg at 12/18/19 2141  . thiamine tablet 100 mg  100 mg Oral Daily Couture, Cortni S, PA-C   100 mg at 12/19/19 [id number]   Or  . thiamine (B-1) injection 100 mg  100 mg Intravenous Daily Couture, Cortni S, PA-C       Current Outpatient Medications  Medication Sig Dispense Refill  . amLODipine (NORVASC) 5 MG tablet Take 5 mg by mouth daily.    . clonazePAM (KLONOPIN) 1 MG tablet Take 1 tablet (1 mg total) by mouth daily as needed for up to 10 doses (For nervousness). 10 tablet 0  . cycloSPORINE (SANDIMMUNE) 25 MG capsule Take 125 mg by mouth daily.    . cycloSPORINE  (SANDIMMUNE) 25 MG capsule Take 150 mg by mouth at bedtime.    . divalproex (DEPAKOTE ER) 500 MG 24 hr tablet Take 500 mg by mouth at bedtime.    Marland Kitchen doxazosin (CARDURA) 1 MG tablet Take 1 mg by mouth at bedtime.    . fenofibrate 160 MG tablet Take 160 mg by  mouth daily.    Marland Kitchen levothyroxine (SYNTHROID) 50 MCG tablet Take 50 mcg by mouth daily before breakfast.    . LORazepam (ATIVAN) 1 MG tablet Take 1 tablet (1 mg total) by mouth 3 (three) times daily as needed for anxiety. 30 tablet 0  . Magnesium 400 MG TABS Take 1 tablet by mouth daily.    . megestrol (MEGACE) 40 MG/ML suspension Take 20 mg by mouth daily.    . metoprolol tartrate (LOPRESSOR) 100 MG tablet Take 100 mg by mouth 2 (two) times daily.    . Multiple Vitamin (MULTIVITAMIN) tablet Take 1 tablet by mouth daily.    . mycophenolate (CELLCEPT) 250 MG capsule Take 250 mg by mouth 2 (two) times daily.    Marland Kitchen OLANZapine (ZYPREXA) 2.5 MG tablet Take 1 tablet (2.5 mg total) by mouth daily. 30 tablet 0  . OLANZapine (ZYPREXA) 5 MG tablet Take 1 tablet (5 mg total) by mouth at bedtime. 30 tablet 0    Lab Results:  Results for orders placed or performed during the hospital encounter of 12/17/19 (from the past 48 hour(s))  Respiratory Panel by RT PCR (Flu A&B, Covid) - Nasopharyngeal Swab     Status: None   Collection Time: 12/17/19  2:37 PM   Specimen: Nasopharyngeal Swab  Result Value Ref Range   SARS Coronavirus 2 by RT PCR NEGATIVE NEGATIVE    Comment: (NOTE) SARS-CoV-2 target nucleic acids are NOT DETECTED. The SARS-CoV-2 RNA is generally detectable in upper respiratoy specimens during the acute phase of infection. The lowest concentration of SARS-CoV-2 viral copies this assay can detect is 131 copies/mL. A negative result does not preclude SARS-Cov-2 infection and should not be used as the sole basis for treatment or other patient management decisions. A negative result may occur with  improper specimen collection/handling, submission  of specimen other than nasopharyngeal swab, presence of viral mutation(s) within the areas targeted by this assay, and inadequate number of viral copies (<131 copies/mL). A negative result must be combined with clinical observations, patient history, and epidemiological information. The expected result is Negative. Fact Sheet for Patients:  PinkCheek.be Fact Sheet for Healthcare Providers:  GravelBags.it This test is not yet ap proved or cleared by the [place] FDA and  has been authorized for detection and/or diagnosis of SARS-CoV-2 by FDA under an Emergency Use Authorization (EUA). This EUA will remain  in effect (meaning this test can be used) for the duration of the COVID-19 declaration under Section 564(b)(1) of the Act, 21 U.S.C. section 360bbb-3(b)(1), unless the authorization is terminated or revoked sooner.    Influenza A by PCR NEGATIVE NEGATIVE   Influenza B by PCR NEGATIVE NEGATIVE    Comment: (NOTE) The Xpert Xpress SARS-CoV-2/FLU/RSV assay is intended as an aid in  the diagnosis of influenza from Nasopharyngeal swab specimens and  should not be used as a sole basis for treatment. Nasal washings and  aspirates are unacceptable for Xpert Xpress SARS-CoV-2/FLU/RSV  testing. Fact Sheet for Patients: PinkCheek.be Fact Sheet for Healthcare Providers: GravelBags.it This test is not yet approved or cleared by the [place] FDA and  has been authorized for detection and/or diagnosis of SARS-CoV-2 by  FDA under an Emergency Use Authorization (EUA). This EUA will remain  in effect (meaning this test can be used) for the duration of the  Covid-19 declaration under Section 564(b)(1) of the Act, 21  U.S.C. section 360bbb-3(b)(1), unless the authorization is  terminated or revoked. Performed at [facility], [place]  [address] [place], [place] [address]   Ethanol     Status: None   Collection Time: 12/17/19  3:05 PM  Result Value Ref Range   Alcohol, Ethyl (B) <10 <10 mg/dL    Comment: (NOTE) Lowest detectable limit for serum alcohol is 10 mg/dL. For medical purposes only. Performed at [facility], [place] [address] [place], [place] [address]   Comprehensive metabolic panel     Status: Abnormal   Collection Time: 12/17/19  3:05 PM  Result Value Ref Range   Sodium 140 135 - 145 mmol/L   Potassium 4.4 3.5 - 5.1 mmol/L   Chloride 113 (H) 98 - 111 mmol/L   CO2 18 (L) 22 - 32 mmol/L   Glucose, Bld 93 70 - 99 mg/dL   BUN 33 (H) 8 - 23 mg/dL   Creatinine, Ser 1.35 (H) 0.44 - 1.00 mg/dL   Calcium 9.5 8.9 - 10.3 mg/dL   Total Protein 7.1 6.5 - 8.1 g/dL   Albumin 3.6 3.5 - 5.0 g/dL   AST 15 15 - 41 U/L   ALT 8 0 - 44 U/L   Alkaline Phosphatase 29 (L) 38 - 126 U/L   Total Bilirubin 0.6 0.3 - 1.2 mg/dL   GFR calc non Af Amer 41 (L) >60 mL/min   GFR calc Af Amer 47 (L) >60 mL/min   Anion gap 9 5 - 15    Comment: Performed at [facility], [place] [address] [place], [place] [address]  CBC with Differential     Status: Abnormal   Collection Time: 12/17/19  3:05 PM  Result Value Ref Range   WBC 13.4 (H) 4.0 - 10.5 K/uL   RBC 4.18 3.87 - 5.11 MIL/uL   Hemoglobin 13.0 12.0 - 15.0 g/dL   HCT 39.5 36.0 - 46.0 %   MCV 94.5 80.0 - 100.0 fL   MCH 31.1 26.0 - 34.0 pg   MCHC 32.9 30.0 - 36.0 g/dL   RDW 12.5 11.5 - 15.5 %   Platelets 295 150 - 400 K/uL   nRBC 0.0 0.0 - 0.2 %   Neutrophils Relative % 75 %   Neutro Abs 10.1 (H) 1.7 - 7.7 K/uL   Lymphocytes Relative 15 %   Lymphs Abs 1.9 0.7 - 4.0 K/uL   Monocytes Relative 8 %   Monocytes Absolute 1.1 (H) 0.1 - 1.0 K/uL   Eosinophils Relative 1 %   Eosinophils Absolute 0.1 0.0 - 0.5 K/uL   Basophils Relative 0 %   Basophils Absolute 0.1 0.0 - 0.1 K/uL   Immature Granulocytes 1 %   Abs Immature Granulocytes 0.09 (H) 0.00 -  0.07 K/uL    Comment: Performed at [facility], [place] [address], [place], [place] [address]  Valproic acid level     Status: Abnormal   Collection Time: 12/17/19  3:38 PM  Result Value Ref Range   Valproic Acid Lvl 25 (L) 50.0 - 100.0 ug/mL    Comment: Performed at [facility], [place] [address], [place], [place] [address]  Lactic acid, plasma     Status: None   Collection Time: 12/17/19 10:32 PM  Result Value Ref Range   Lactic Acid, Venous 0.8 0.5 - 1.9 mmol/L    Comment: Performed at [facility], [place] [address], [place], [place] [address]  Urine culture     Status: None   Collection Time: 12/17/19 10:32 PM   Specimen: Urine, Random  Result Value  Ref Range   Specimen Description      URINE, RANDOM Performed at [facility], [place] [address] [place], [place] [address]    Special Requests      NONE Performed at [facility], [place] [address] [place], [place] [address]    Culture      NO GROWTH Performed at [facility], [place] [address] [place], [place] [address]    Report Status 12/18/2019 FINAL   Urinalysis, Routine w reflex microscopic     Status: Abnormal   Collection Time: 12/17/19 10:50 PM  Result Value Ref Range   Color, Urine YELLOW YELLOW   APPearance CLEAR CLEAR   Specific Gravity, Urine 1.019 1.005 - 1.030   pH 5.0 5.0 - 8.0   Glucose, UA 50 (A) NEGATIVE mg/dL   Hgb urine dipstick NEGATIVE NEGATIVE   Bilirubin Urine NEGATIVE NEGATIVE   Ketones, ur NEGATIVE NEGATIVE mg/dL   Protein, ur NEGATIVE NEGATIVE mg/dL   Nitrite NEGATIVE NEGATIVE   Leukocytes,Ua NEGATIVE NEGATIVE    Comment: Performed at [place] [address], [place], [place] [address]  Urine rapid drug screen (hosp performed)     Status: Abnormal   Collection Time: 12/17/19 10:50 PM  Result Value Ref Range   Opiates NONE DETECTED NONE DETECTED   Cocaine NONE DETECTED NONE  DETECTED   Benzodiazepines POSITIVE (A) NONE DETECTED   Amphetamines NONE DETECTED NONE DETECTED   Tetrahydrocannabinol NONE DETECTED NONE DETECTED   Barbiturates NONE DETECTED NONE DETECTED    Comment: (NOTE) DRUG SCREEN FOR MEDICAL PURPOSES ONLY.  IF CONFIRMATION IS NEEDED FOR ANY PURPOSE, NOTIFY LAB WITHIN 5 DAYS. LOWEST DETECTABLE LIMITS FOR URINE DRUG SCREEN Drug Class                     Cutoff (ng/mL) Amphetamine and metabolites    1000 Barbiturate and metabolites    200 Benzodiazepine                 [id number] Tricyclics and metabolites     300 Opiates and metabolites        300 Cocaine and metabolites        300 THC                            50 Performed at [facility], [place] [address] [place], [place] [address]     Blood Alcohol level:  Lab Results  Component Value Date   ETH <10 12/17/2019    Physical Findings: AIMS:  , ,  ,  ,    CIWA:  CIWA-Ar Total: 0 COWS:  COWS Total Score: 9  Musculoskeletal: Strength & Muscle Tone: Unable to determine at this time Gait & Station: Have not seen patient ambulate Patient leans: N/A  Psychiatric Specialty Exam: Physical Exam  Review of Systems  Blood pressure 114/72, pulse 92, temperature 98.8 F (37.1 C), temperature source Oral, resp. rate 20, SpO2 98 %.There is no height or weight on file to calculate BMI.  General Appearance: Casual  Eye Contact:  Good  Speech:  Clear and Coherent and Normal Rate  Volume:  Normal  Mood:  Depressed  Affect:  Depressed and Flat  Thought Process:  Coherent and Disorganized  Orientation:  Other:  person and place  Thought Content:  Hallucinations: Visual  Suicidal Thoughts:  Yes.  with intent/plan  Homicidal Thoughts:  No  Memory:  Immediate;   Poor Recent;   Poor Remote;   Poor  Judgement:  Impaired  Insight:  Lacking  Psychomotor Activity:  Decreased  Concentration:  Concentration: Fair and Attention Span: Fair  Recall:  Poor  Fund of Knowledge:  Fair   Language:  Fair  Akathisia:  No  Handed:  Right  AIMS (if indicated):     Assets:  Communication Skills Desire for Improvement Housing Social Support  ADL's:  Intact  Cognition:  Impaired,  Mild  Sleep:       Treatment Plan Summary: Daily contact with patient to assess and evaluate symptoms and progress in treatment, Medication management and Plan Will continue to seek geropsychiatric hospitalization   Psychotropic Medications Ativan detox protocol Depakote DR 500 mg Q hs Zyprexa 2.5 mg daily and 5 mg Q hs  Disposition: Continue to seek geropsychiatric hospitalization (available bed).  Zelda Reames, NP 12/19/2019, 2:10 PM

## 2019-12-20 LAB — COMPREHENSIVE METABOLIC PANEL
ALT: 7 U/L (ref 0–44)
AST: 19 U/L (ref 15–41)
Albumin: 3.3 g/dL — ABNORMAL LOW (ref 3.5–5.0)
Alkaline Phosphatase: 30 U/L — ABNORMAL LOW (ref 38–126)
Anion gap: 11 (ref 5–15)
BUN: 34 mg/dL — ABNORMAL HIGH (ref 8–23)
CO2: 16 mmol/L — ABNORMAL LOW (ref 22–32)
Calcium: 9.5 mg/dL (ref 8.9–10.3)
Chloride: 112 mmol/L — ABNORMAL HIGH (ref 98–111)
Creatinine, Ser: 1.44 mg/dL — ABNORMAL HIGH (ref 0.44–1.00)
GFR calc Af Amer: 43 mL/min — ABNORMAL LOW (ref >60)
GFR calc non Af Amer: 37 mL/min — ABNORMAL LOW (ref >60)
Glucose, Bld: 141 mg/dL — ABNORMAL HIGH (ref 70–99)
Potassium: 3.7 mmol/L (ref 3.5–5.1)
Sodium: 139 mmol/L (ref 135–145)
Total Bilirubin: 0.4 mg/dL (ref 0.3–1.2)
Total Protein: 6.6 g/dL (ref 6.5–8.1)

## 2019-12-20 LAB — CBC WITH DIFFERENTIAL/PLATELET
Abs Immature Granulocytes: 0.1 10*3/uL — ABNORMAL HIGH (ref 0.00–0.07)
Basophils Absolute: 0 10*3/uL (ref 0.0–0.1)
Basophils Relative: 0 %
Eosinophils Absolute: 0.1 10*3/uL (ref 0.0–0.5)
Eosinophils Relative: 1 %
HCT: 34.9 % — ABNORMAL LOW (ref 36.0–46.0)
Hemoglobin: 11.9 g/dL — ABNORMAL LOW (ref 12.0–15.0)
Immature Granulocytes: 1 %
Lymphocytes Relative: 16 %
Lymphs Abs: 1.7 10*3/uL (ref 0.7–4.0)
MCH: 31.1 pg (ref 26.0–34.0)
MCHC: 34.1 g/dL (ref 30.0–36.0)
MCV: 91.1 fL (ref 80.0–100.0)
Monocytes Absolute: 0.7 10*3/uL (ref 0.1–1.0)
Monocytes Relative: 6 %
Neutro Abs: 8.5 10*3/uL — ABNORMAL HIGH (ref 1.7–7.7)
Neutrophils Relative %: 76 %
Platelets: 242 10*3/uL (ref 150–400)
RBC: 3.83 MIL/uL — ABNORMAL LOW (ref 3.87–5.11)
RDW: 12.1 % (ref 11.5–15.5)
WBC: 11.1 10*3/uL — ABNORMAL HIGH (ref 4.0–10.5)
nRBC: 0 % (ref 0.0–0.2)

## 2019-12-20 NOTE — BH Assessment (Signed)
[place] Assessment Progress Note  Per Shuvon Rankin, FNP, this pt continues to require psychiatric hospitalization at this time.  Pt remains under IVC initiated by Hampton Abbot, MD.  The following facilities have been contacted to seek placement for this pt, with results as noted:  Beds available, information sent, decision pending: [facility]  Declined: [facility] (due to medical acuity)  At capacity: [facility] (referral sent 2/1; at capacity today) [place], [place] Coordinator [phone]

## 2019-12-20 NOTE — Progress Notes (Signed)
Received Brenda Nolan at the change of shift awake in her bed watching TV. The sitter is at the bedside.She stated she is dead. She slept well throughout the nught.

## 2019-12-20 NOTE — Consult Note (Signed)
  Patient seen today via tele psych by this provider, consulted with Dr. Dwyane Dee, and chart reviewed.  Patient  Continues to endorse suicidal ideation and not contracting for safety. Patient asked how she was doing and responded "I died".  Patient responded to each question with "I died."  Spoke with patients nurse who informed that patient was up and down throughout the night, but she has been taking medications and ate well this morning.    Will continue to seek geropsychiatric placement.

## 2019-12-21 ENCOUNTER — Emergency Department (HOSPITAL_COMMUNITY): Payer: Medicare Other

## 2019-12-21 LAB — BASIC METABOLIC PANEL
Anion gap: 9 (ref 5–15)
BUN: 29 mg/dL — ABNORMAL HIGH (ref 8–23)
CO2: 20 mmol/L — ABNORMAL LOW (ref 22–32)
Calcium: 9.4 mg/dL (ref 8.9–10.3)
Chloride: 112 mmol/L — ABNORMAL HIGH (ref 98–111)
Creatinine, Ser: 1.43 mg/dL — ABNORMAL HIGH (ref 0.44–1.00)
GFR calc Af Amer: 44 mL/min — ABNORMAL LOW (ref >60)
GFR calc non Af Amer: 38 mL/min — ABNORMAL LOW (ref >60)
Glucose, Bld: 122 mg/dL — ABNORMAL HIGH (ref 70–99)
Potassium: 4.5 mmol/L (ref 3.5–5.1)
Sodium: 141 mmol/L (ref 135–145)

## 2019-12-21 LAB — MAGNESIUM: Magnesium: 2.1 mg/dL (ref 1.7–2.4)

## 2019-12-21 LAB — LACTIC ACID, PLASMA: Lactic Acid, Venous: 1.3 mmol/L (ref 0.5–1.9)

## 2019-12-21 LAB — TSH: TSH: 2.535 u[IU]/mL (ref 0.350–4.500)

## 2019-12-21 LAB — T4, FREE: Free T4: 1.56 ng/dL — ABNORMAL HIGH (ref 0.61–1.12)

## 2019-12-21 NOTE — ED Provider Notes (Signed)
  Physical Exam  BP 137/62 (BP Location: Left Arm)   Pulse 89   Temp 98 F (36.7 C) (Oral)   Resp 20   LMP  (LMP Unknown)   SpO2 100%   Physical Exam  ED Course/Procedures     Procedures  MDM  Care assumed at 3 pm.  Patient has been having intermittent tachycardia and confusion.  Patient was recently admitted for similar symptoms.  Patient was noted to be tachycardic to 120 earlier today.  However she seemed demented and confused.  Signout pending repeat labs and if stable will remain medically cleared  5:23 PM I reviewed labs and her labs are stable compared to yesterday her TSH is also normal.  Her tachycardia has resolved by itself and her heart rate is down to 89.  Patient was medical cleared by previous provider and remains medically cleared currently now.     Drenda Freeze, MD 12/21/19 [phone]

## 2019-12-21 NOTE — Progress Notes (Signed)
Received Brenda Nolan at the change of shift awake in her bed watching the TV with the sitter at the bedside. Later she was medicated per order and made comfortable for the night. She slept well throughout the night.

## 2019-12-21 NOTE — ED Provider Notes (Signed)
Assumed care of the patient at 0 700.  Briefly the patient is a 69 year old female that is awaiting psychiatric placement.  I received a notification that we are having trouble with placement due to persistent tachycardia and prolonged QT.  Reviewing the patient's chart it seems that she has a chronic issue with hypomagnesemia, will obtain a repeat electrolyte check.  Repeat EKG as it appears she was in sinus tachycardia previously.  She is on metoprolol and so I was wondering if she had missed dosages but it looks like she has been dosed appropriately here in the ED.  She is still on levothyroxine, will check a thyroid level.  My exam the patient feels warm to the touch.  She is acutely altered.  Per the staff they felt this is slightly worse over the past 24 hours.  She has no abdominal tenderness.  Clear lung sounds.  We'll have them check a rectal temperature.  Obtain a lactate level.  Reassess.  Signed out to Dr. Darl Householder, please see his note for further details care in ED.   Deno Etienne, DO 12/22/19 [phone]

## 2019-12-21 NOTE — Consult Note (Signed)
  Patient assessed by nurse practitioner, along with Dr. Dwyane Dee.  Patient seen via teleassessment.  Patient appears more confused today, with labored breathing.  Patient unable to adequately participate in assessment.  Patient continues to verbalize "I died."  Continue to recommend inpatient geriatric psychiatric admission once medically clear.  Spoke with emergency department physician who will evaluate patient.

## 2019-12-21 NOTE — BH Assessment (Signed)
[place] Assessment Progress Note  Per Letitia Libra, FNP, this pt continues to require psychiatric hospitalization at this time.  Pt remains under IVC initiated by Hampton Abbot, MD.  Pt was referred to [facility] yesterday (12/20/2019).  Voice message for this writer indicates that they have declined pt for admission, opining that pt is not medically stable enough at this time.  Pt has been re-referred to [facility], where a bed may be available today.  This Probation officer has called [place], DO, informing him that several facilities have declined pt due to perceived medical instability.  Dr Tyrone Nine agrees to review pt.  Jalene Mullet, [place] Coordinator [phone]

## 2019-12-22 LAB — BASIC METABOLIC PANEL
Anion gap: 13 (ref 5–15)
BUN: 28 mg/dL — ABNORMAL HIGH (ref 8–23)
CO2: 13 mmol/L — ABNORMAL LOW (ref 22–32)
Calcium: 9.6 mg/dL (ref 8.9–10.3)
Chloride: 114 mmol/L — ABNORMAL HIGH (ref 98–111)
Creatinine, Ser: 1.36 mg/dL — ABNORMAL HIGH (ref 0.44–1.00)
GFR calc Af Amer: 47 mL/min — ABNORMAL LOW (ref >60)
GFR calc non Af Amer: 40 mL/min — ABNORMAL LOW (ref >60)
Glucose, Bld: 115 mg/dL — ABNORMAL HIGH (ref 70–99)
Potassium: 4.6 mmol/L (ref 3.5–5.1)
Sodium: 140 mmol/L (ref 135–145)

## 2019-12-22 LAB — BLOOD GAS, VENOUS
O2 Saturation: 99.5 %
Patient temperature: 98.6
pH, Ven: 7.619 (ref 7.250–7.430)
pO2, Ven: 174 mmHg — ABNORMAL HIGH (ref 32.0–45.0)

## 2019-12-22 LAB — AMMONIA: Ammonia: 28 umol/L (ref 9–35)

## 2019-12-22 LAB — VALPROIC ACID LEVEL: Valproic Acid Lvl: 19 ug/mL — ABNORMAL LOW (ref 50.0–100.0)

## 2019-12-22 NOTE — BH Assessment (Signed)
[place] Assessment Progress Note  Per Hampton Abbot, MD, this pt has been psychiatrically cleared.  Pt presents under IVC initiated by Dr Dwyane Dee on 12/18/2019, which she has now rescinded.  An order has been placed for a social work consult for this pt to address pt's psychosocial needs.  Jalene Mullet, [place] Coordinator [phone]

## 2019-12-22 NOTE — ED Provider Notes (Addendum)
TCU nurse informs me patient continues to be tachycardic.  She has not had her evening metoprolol.  It is not due until 10.  Will have metoprolol administered now at approximately 8 PM.  I have added some basic labs and reviewed the current labs.  I have added an ammonia level, venous pH and Depakote level as well as repeat basic chemistry.  Patient does appear to have baseline cognitive issues at least as documented since her discharge charge from the hospital on 1\20\2021.  At that time her orientation was to name and the fact that she was in the hospital.  Patient is awake and alert.  She has a bit of repetitive masticating mouth motions.  She reports she wants something to drink.  Her demeanor and speech patterns are suggestive of psychiatric illness.  Patient does not appear to have evident delirium.  She is moderately tremulous.  She will assist in following commands.  Abdomen is soft without guarding.  Lungs are grossly clear.  Heart is regular.  Lower extremities are soft without peripheral edema or calf tenderness.  By review of EMR, it appears patient's mental status has been pretty consistent since her first presentation in the first week of January.  She had several CT scans and admission to the hospital.  This time I do not think there is an acute change from last month.  It appears she has been periodically tachycardic.  I do not have high index of suspicion for infectious etiology.  Patient is supposed to be on metoprolol twice daily.  There may be some element of medication side effect.  Will check Depakote level as well.  At this time I do not feel that further work-up for infectious etiology is indicated.  Patient has a progressively worsening alkalosis with persistent tachycardia today.  Have reviewed with Dr. Maudie Mercury hospitalist for admission.  At this time patient appears to be having decompensating medical course.  Will need additional stabilization before placement.    Charlesetta Shanks,  MD 12/22/19 Jake Michaelis, MD 12/23/19 [phone]

## 2019-12-22 NOTE — ED Provider Notes (Signed)
Patient seen on rounds today.  Repeat heart rate is 95.  She is sitting up in bed in resting comfortably.  No new concerns per nursing.  Placement still pending   Lacretia Leigh, MD 12/22/19 [phone]

## 2019-12-22 NOTE — Consult Note (Signed)
[facility] Psych ED Discharge  12/22/2019 11:14 AM Brenda Nolan  MRN:  [id number] Principal Problem: Bipolar 1 disorder, depressed [facility]) Discharge Diagnoses: Principal Problem:   Bipolar 1 disorder, depressed ([place])   Subjective: Patient assessed by nurse practitioner along with Dr. Dwyane Dee.  Patient denies suicidal and homicidal ideations.  Patient does not appear to have symptoms of paranoia at this time.  Patient does not appear to be responding to internal stimuli at this time.  Patient minimally responsive for interview.  Attempted to assess patient via telemedicine, patient possibly confused by telemedicine logistics.  Total Time spent with patient: 30 minutes  Past Psychiatric History: Bipolar 1 disorder  Past Medical History:  Past Medical History:  Diagnosis Date  . Anxiety 2019  . Bipolar 1 disorder ([place]) 2019  . Chronic kidney disease (CKD)    Stage 1  . Difficulty in walking, not elsewhere classified   . Dysphagia, oropharyngeal phase   . Enterocolitis due to Clostridioides difficile 2019  . Hypothyroidism 2019  . Kidney transplant status   . Muscle weakness   . Urinary tract infection    History reviewed. No pertinent surgical history. Family History: History reviewed. No pertinent family history. Family Psychiatric  History: Unknown Social History:  Social History   Substance and Sexual Activity  Alcohol Use Not Currently     Social History   Substance and Sexual Activity  Drug Use Never    Social History   Socioeconomic History  . Marital status: Married    Spouse name: Not on file  . Number of children: Not on file  . Years of education: Not on file  . Highest education level: Not on file  Occupational History  . Occupation: Retired  Tobacco Use  . Smoking status: Never Smoker  . Smokeless tobacco: Never Used  Substance and Sexual Activity  . Alcohol use: Not Currently  . Drug use: Never  . Sexual activity: Not Currently  Other Topics Concern  . Not on  file  Social History Narrative   Pt currently resides at [facility] where she is being treated for an infection.  Pt receives outpatient psychiatric services through a provider at [facility]   Social Determinants of Health   Financial Resource Strain:   . Difficulty of Paying Living Expenses: Not on file  Food Insecurity:   . Worried About Charity fundraiser in the Last Year: Not on file  . Ran Out of Food in the Last Year: Not on file  Transportation Needs:   . Lack of Transportation (Medical): Not on file  . Lack of Transportation (Non-Medical): Not on file  Physical Activity:   . Days of Exercise per Week: Not on file  . Minutes of Exercise per Session: Not on file  Stress:   . Feeling of Stress : Not on file  Social Connections:   . Frequency of Communication with Friends and Family: Not on file  . Frequency of Social Gatherings with Friends and Family: Not on file  . Attends Religious Services: Not on file  . Active Member of Clubs or Organizations: Not on file  . Attends Archivist Meetings: Not on file  . Marital Status: Not on file    Has this patient used any form of tobacco in the last 30 days? (Cigarettes, Smokeless Tobacco, Cigars, and/or Pipes) A prescription for an FDA-approved tobacco cessation medication was offered at discharge and the patient refused  Current Medications: Current Facility-Administered Medications  Medication Dose Route Frequency Provider  Last Rate Last Admin  . amLODipine (NORVASC) tablet 5 mg  5 mg Oral Once Couture, Cortni S, PA-C   Stopped at 12/17/19 2358  . cycloSPORINE (SANDIMMUNE) capsule 125 mg  125 mg Oral q morning - 10a Couture, Cortni S, PA-C   125 mg at 12/22/19 1039  . cycloSPORINE (SANDIMMUNE) capsule 150 mg  150 mg Oral QHS Couture, Cortni S, PA-C   150 mg at 12/21/19 2143  . divalproex (DEPAKOTE) DR tablet 500 mg  500 mg Oral QHS Couture, Cortni S, PA-C   500 mg at 12/21/19 2141  . doxazosin (CARDURA) tablet 1 mg  1 mg  Oral QHS Couture, Cortni S, PA-C   1 mg at 12/21/19 2142  . levothyroxine (SYNTHROID) tablet 50 mcg  50 mcg Oral Q0600 Couture, Cortni S, PA-C   50 mcg at 12/22/19 [id number]  . magnesium oxide (MAG-OX) tablet 400 mg  400 mg Oral Daily Couture, Cortni S, PA-C   400 mg at 12/22/19 1039  . metoprolol tartrate (LOPRESSOR) tablet 100 mg  100 mg Oral BID Couture, Cortni S, PA-C   100 mg at 12/22/19 1039  . mycophenolate (CELLCEPT) capsule 250 mg  250 mg Oral BID Couture, Cortni S, PA-C   250 mg at 12/22/19 1039  . OLANZapine (ZYPREXA) tablet 5 mg  5 mg Oral QHS Couture, Cortni S, PA-C   5 mg at 12/21/19 2141  . thiamine tablet 100 mg  100 mg Oral Daily Couture, Cortni S, PA-C   100 mg at 12/22/19 1040   Or  . thiamine (B-1) injection 100 mg  100 mg Intravenous Daily Couture, Cortni S, PA-C   100 mg at 12/20/19 [id number]   Current Outpatient Medications  Medication Sig Dispense Refill  . amLODipine (NORVASC) 5 MG tablet Take 5 mg by mouth daily.    . clonazePAM (KLONOPIN) 1 MG tablet Take 1 tablet (1 mg total) by mouth daily as needed for up to 10 doses (For nervousness). 10 tablet 0  . cycloSPORINE (SANDIMMUNE) 25 MG capsule Take 125 mg by mouth daily.    . cycloSPORINE (SANDIMMUNE) 25 MG capsule Take 150 mg by mouth at bedtime.    . divalproex (DEPAKOTE ER) 500 MG 24 hr tablet Take 500 mg by mouth at bedtime.    Marland Kitchen doxazosin (CARDURA) 1 MG tablet Take 1 mg by mouth at bedtime.    . fenofibrate 160 MG tablet Take 160 mg by mouth daily.    Marland Kitchen levothyroxine (SYNTHROID) 50 MCG tablet Take 50 mcg by mouth daily before breakfast.    . LORazepam (ATIVAN) 1 MG tablet Take 1 tablet (1 mg total) by mouth 3 (three) times daily as needed for anxiety. 30 tablet 0  . Magnesium 400 MG TABS Take 1 tablet by mouth daily.    . megestrol (MEGACE) 40 MG/ML suspension Take 20 mg by mouth daily.    . metoprolol tartrate (LOPRESSOR) 100 MG tablet Take 100 mg by mouth 2 (two) times daily.    . Multiple Vitamin (MULTIVITAMIN) tablet  Take 1 tablet by mouth daily.    . mycophenolate (CELLCEPT) 250 MG capsule Take 250 mg by mouth 2 (two) times daily.    Marland Kitchen OLANZapine (ZYPREXA) 2.5 MG tablet Take 1 tablet (2.5 mg total) by mouth daily. 30 tablet 0  . OLANZapine (ZYPREXA) 5 MG tablet Take 1 tablet (5 mg total) by mouth at bedtime. 30 tablet 0   PTA Medications: (Not in a hospital admission)   Musculoskeletal: Strength & Muscle Tone:  Unable to assess Gait & Station: Unable to assess Patient leans: Unable to assess  Psychiatric Specialty Exam: Physical Exam Vitals and nursing note reviewed.  Constitutional:      Appearance: She is well-developed.  HENT:     Head: Normocephalic.  Cardiovascular:     Rate and Rhythm: Normal rate.  Pulmonary:     Effort: Pulmonary effort is normal.  Neurological:     Mental Status: She is alert and oriented to person, place, and time.  Psychiatric:        Mood and Affect: Mood normal.     Review of Systems  Constitutional: Negative.   HENT: Negative.   Eyes: Negative.   Respiratory: Negative.   Cardiovascular: Negative.   Gastrointestinal: Negative.   Genitourinary: Negative.   Musculoskeletal: Negative.   Skin: Negative.   Neurological: Negative.   Psychiatric/Behavioral: The patient is nervous/anxious.     Blood pressure 125/82, pulse 95, temperature 99.4 F (37.4 C), temperature source Rectal, resp. rate (!) 22, SpO2 96 %.There is no height or weight on file to calculate BMI.  General Appearance: Casual and Fairly Groomed  Eye Contact:  Minimal  Speech:  Slow  Volume:  Decreased  Mood:  Anxious  Affect:  Congruent  Thought Process:  Coherent and Descriptions of Associations: Intact  Orientation:  Full (Time, Place, and Person)  Thought Content:  Logical  Suicidal Thoughts:  No  Homicidal Thoughts:  No  Memory:  Immediate;   Fair Remote;   Fair  Judgement:  Impaired  Insight:  Lacking  Psychomotor Activity:  Normal  Concentration:  Concentration: Fair   Recall:  [place] of Knowledge:  Good   Language:  Good  Akathisia:  NA  Handed:  Right  AIMS (if indicated):     Assets:  Communication Skills Desire for Improvement Financial Resources/Insurance Housing  ADL's: Unable to assess  Cognition:  Impaired,  Mild  Sleep:        Demographic Factors:  Age 76 or older  Loss Factors: NA  Historical Factors: NA  Risk Reduction Factors:   Positive social support, Positive therapeutic relationship and Positive coping skills or problem solving skills  Continued Clinical Symptoms:  Severe Anxiety and/or Agitation  Cognitive Features That Contribute To Risk:  None    Suicide Risk:  Minimal: No identifiable suicidal ideation.  Patients presenting with no risk factors but with morbid ruminations; may be classified as minimal risk based on the severity of the depressive symptoms    Plan Of Care/Follow-up recommendations:  Other:  Follow-up with outpatient provider  Disposition: Patient cleared by [place], FNP 12/22/2019, 11:14 AM

## 2019-12-22 NOTE — ED Notes (Signed)
Patient B/P elevated and tachycardic, this is not an acute change has had this since admission, MD notified.

## 2019-12-22 NOTE — ED Notes (Signed)
Pt  alert x4, states that she is feeling suicidal  with no plan to harm herself, denies visual or auditory hallucinations.   Had a pleasant day, has requested staff to turn t.v. on at this time. I will continue to monitor.

## 2019-12-22 NOTE — NC FL2 (Signed)
[place] LEVEL OF CARE SCREENING TOOL     IDENTIFICATION  Patient Name: Brenda Nolan Birthdate: Dec 24, 1950 Sex: female Admission Date (Current Location): 12/17/2019  [facility] and [place] Number:  Herbalist and Address:  [facility],  [place] [address], [place]      Provider Number: [id number]  Attending Physician Name and Address:  Default, Provider, MD  Relative Name and Phone Number:       Current Level of Care: Hospital Recommended Level of Care: [place] Prior Approval Number:    Date Approved/Denied:   PASRR Number:    Discharge Plan: SNF    Current Diagnoses: Patient Active Problem List   Diagnosis Date Noted  . Need for transfer to another facility   . Suicidal ideation   . AMS (altered mental status) 11/30/2019  . Altered mental status 11/29/2019  . Kidney transplant status   . Stage 3b chronic kidney disease   . Essential hypertension   . Bipolar 1 disorder, depressed ([place]) 2019  . Hypothyroidism 2019    Orientation RESPIRATION BLADDER Height & Weight     Self  Normal Incontinent Weight:   Height:     BEHAVIORAL SYMPTOMS/MOOD NEUROLOGICAL BOWEL NUTRITION STATUS      Incontinent Diet(Heart Healthy)  AMBULATORY STATUS COMMUNICATION OF NEEDS Skin   Extensive Assist Verbally Normal(Pt has a rash on her back)                       Personal Care Assistance Level of Assistance  Bathing, Dressing Bathing Assistance: Limited assistance Feeding assistance: Independent(Food needs to be cut up as pt has HX of dsyphagia and may have difficulty swallowing) Dressing Assistance: Limited assistance     Functional Limitations Info    Sight Info: Adequate Hearing Info: Adequate Speech Info: Adequate    SPECIAL CARE FACTORS FREQUENCY  PT (By licensed PT), OT (By licensed OT), Blood pressure(Pt has hypertension)     PT Frequency: 5 OT Frequency: 5            Contractures Contractures  Info: Not present    Additional Factors Info  Code Status, Allergies Code Status Info: FULL Allergies Info: Atorvastatin, Ketoconazole, Abilify (Aripiprazole), Ibuprofen, Levaquin (Levofloxacin), Sulfa Antibiotics, Sulfamethoxazole     Isolation Precautions Info: 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 amLODipine (NORVASC) tablet 5 mg  5 mg Oral Once Couture, Cortni S, PA-C   Stopped at 12/17/19 2358  . cycloSPORINE (SANDIMMUNE) capsule 125 mg  125 mg Oral q morning - 10a Couture, Cortni S, PA-C   125 mg at 12/22/19 1039  . cycloSPORINE (SANDIMMUNE) capsule 150 mg  150 mg Oral QHS Couture, Cortni S, PA-C   150 mg at 12/21/19 2143  . divalproex (DEPAKOTE) DR tablet 500 mg  500 mg Oral QHS Couture, Cortni S, PA-C   500 mg at 12/21/19 2141  . doxazosin (CARDURA) tablet 1 mg  1 mg Oral QHS Couture, Cortni S, PA-C   1 mg at 12/21/19 2142  . levothyroxine (SYNTHROID) tablet 50 mcg  50 mcg Oral Q0600 Couture, Cortni S, PA-C   50 mcg at 12/22/19 [id number]  . magnesium oxide (MAG-OX) tablet 400 mg  400 mg Oral Daily Couture, Cortni S, PA-C   400 mg at 12/22/19 1039  .  metoprolol tartrate (LOPRESSOR) tablet 100 mg  100 mg Oral BID Couture, Cortni S, PA-C   100 mg at 12/22/19 1039  . mycophenolate (CELLCEPT) capsule 250 mg  250 mg Oral BID Couture, Cortni S, PA-C   250 mg at 12/22/19 1039  . OLANZapine (ZYPREXA) tablet 5 mg  5 mg Oral QHS Couture, Cortni S, PA-C   5 mg at 12/21/19 2141  . thiamine tablet 100 mg  100 mg Oral Daily Couture, Cortni S, PA-C   100 mg at 12/22/19 1040   Or  . thiamine (B-1) injection 100 mg  100 mg Intravenous Daily Couture, Cortni S, PA-C   100 mg at 12/20/19 [id number]   Current Outpatient Medications  Medication Sig Dispense Refill  . amLODipine (NORVASC) 5 MG tablet Take 5 mg by mouth daily.    .  clonazePAM (KLONOPIN) 1 MG tablet Take 1 tablet (1 mg total) by mouth daily as needed for up to 10 doses (For nervousness). 10 tablet 0  . cycloSPORINE (SANDIMMUNE) 25 MG capsule Take 125 mg by mouth daily.    . cycloSPORINE (SANDIMMUNE) 25 MG capsule Take 150 mg by mouth at bedtime.    . divalproex (DEPAKOTE ER) 500 MG 24 hr tablet Take 500 mg by mouth at bedtime.    Marland Kitchen doxazosin (CARDURA) 1 MG tablet Take 1 mg by mouth at bedtime.    . fenofibrate 160 MG tablet Take 160 mg by mouth daily.    Marland Kitchen levothyroxine (SYNTHROID) 50 MCG tablet Take 50 mcg by mouth daily before breakfast.    . LORazepam (ATIVAN) 1 MG tablet Take 1 tablet (1 mg total) by mouth 3 (three) times daily as needed for anxiety. 30 tablet 0  . Magnesium 400 MG TABS Take 1 tablet by mouth daily.    . megestrol (MEGACE) 40 MG/ML suspension Take 20 mg by mouth daily.    . metoprolol tartrate (LOPRESSOR) 100 MG tablet Take 100 mg by mouth 2 (two) times daily.    . Multiple Vitamin (MULTIVITAMIN) tablet Take 1 tablet by mouth daily.    . mycophenolate (CELLCEPT) 250 MG capsule Take 250 mg by mouth 2 (two) times daily.    Marland Kitchen OLANZapine (ZYPREXA) 2.5 MG tablet Take 1 tablet (2.5 mg total) by mouth daily. 30 tablet 0  . OLANZapine (ZYPREXA) 5 MG tablet Take 1 tablet (5 mg total) by mouth at bedtime. 30 tablet 0     Discharge Medications: Please see discharge summary for a list of discharge medications.  Relevant Imaging Results:  Relevant Lab Results:   Additional Information [id number]  Alphonse Guild Corlette Ciano, LCSW

## 2019-12-22 NOTE — Progress Notes (Addendum)
CSW received [facility] consult stating that patient was psych cleared. In reviewing chart, patient presented to the ED from [facility]. CSW spoke with patient's son/POA Achille Rich [phone]) and updated him on this. Remo Lipps reports he believe patient needs to return to rehab as to his knowledge she was only there for a few days. He reports patient is a long-term resident of [place] and will return there after getting the rehab she needs.   CSW contacted Bryson Ha with [facility] who is aware of patient. CSW provided her with an update. Bryson Ha called CSW back and stated she spoke with her administration and it was noted that patient has a Actuary. Due to patient having a sitter they cannot take her back until she has been without one for 24 hours. CSW spoke with EDP Dr. Zenia Resides who discontinued the sitter order. Bryson Ha reports they can take patient tomorrow 12/23/2019 as long as no issues arise. CSW will continue to follow up.   Per Kern Reap [phone]) will be on call this weekend.   [place]en Circle, LCSW Transitions of Care Department [facility] ED [phone]

## 2019-12-22 NOTE — Progress Notes (Signed)
2nd shift ED CSW received a handoff from the 1st shift [facility] ED CSW.   Pt can go back to [facility] on 12/23/19 once pt has, per [facility], has not had a sitter for 24 hours.  Per the 1st shift ED CSW pt was seen as appropriate for not needing a sitter at approx 11:50 am on 12/22/19.  Thus, pt should be able to return to [facility] after 11:50 am on Sat 2/6.  PASRR will not be needed, but an updated FL-2 and a signed AVS (with penned signature on the pt's d/c meds page) will need to be faxed to fax: [phone].  Room # will be 121 # for report will be [phone]  Pt will not be discharged back to [place] until pt's social worker states pt is ready.  CSW will continue to follow for D/C needs.  Alphonse Guild. Keino Placencia, LCSW, LCAS, CSI Transitions of Care Clinical Social Worker Care Coordination Department Ph: [phone]

## 2019-12-22 NOTE — Progress Notes (Addendum)
CSW sent pt's signed/updated FL-2 to [facility] via the hub, per instructions from Tammy in admission in preparation for imminent D/C on 12/23/19 once CSW confirms with Tammy pt can go.  CSW will continue to follow for D/C needs.  Alphonse Guild. Tanieka Pownall, LCSW, LCAS, CSI Transitions of Care Clinical Social Worker Care Coordination Department Ph: [phone]

## 2019-12-22 NOTE — ED Notes (Addendum)
Pt can physically feed herself but is not safe for her to be unsupervised. Pt eats her food way too fast and breathes through her mouth. Pt is at risk for choking and needs help with feeding to ensure she does not choke.

## 2019-12-23 ENCOUNTER — Inpatient Hospital Stay (HOSPITAL_COMMUNITY): Payer: Medicare Other

## 2019-12-23 ENCOUNTER — Observation Stay (HOSPITAL_COMMUNITY): Payer: Medicare Other

## 2019-12-23 DIAGNOSIS — R45851 Suicidal ideations: Secondary | ICD-10-CM | POA: Diagnosis present

## 2019-12-23 DIAGNOSIS — E039 Hypothyroidism, unspecified: Secondary | ICD-10-CM | POA: Diagnosis present

## 2019-12-23 DIAGNOSIS — Z87440 Personal history of urinary (tract) infections: Secondary | ICD-10-CM | POA: Diagnosis not present

## 2019-12-23 DIAGNOSIS — R079 Chest pain, unspecified: Secondary | ICD-10-CM

## 2019-12-23 DIAGNOSIS — N186 End stage renal disease: Secondary | ICD-10-CM | POA: Diagnosis present

## 2019-12-23 DIAGNOSIS — Z79890 Hormone replacement therapy (postmenopausal): Secondary | ICD-10-CM | POA: Diagnosis not present

## 2019-12-23 DIAGNOSIS — G3184 Mild cognitive impairment, so stated: Secondary | ICD-10-CM | POA: Diagnosis present

## 2019-12-23 DIAGNOSIS — T4395XA Adverse effect of unspecified psychotropic drug, initial encounter: Secondary | ICD-10-CM | POA: Diagnosis not present

## 2019-12-23 DIAGNOSIS — F319 Bipolar disorder, unspecified: Secondary | ICD-10-CM | POA: Diagnosis not present

## 2019-12-23 DIAGNOSIS — R000 Tachycardia, unspecified: Secondary | ICD-10-CM | POA: Diagnosis present

## 2019-12-23 DIAGNOSIS — Z20822 Contact with and (suspected) exposure to covid-19: Secondary | ICD-10-CM | POA: Diagnosis present

## 2019-12-23 DIAGNOSIS — E872 Acidosis: Secondary | ICD-10-CM | POA: Diagnosis present

## 2019-12-23 DIAGNOSIS — Z881 Allergy status to other antibiotic agents status: Secondary | ICD-10-CM | POA: Diagnosis not present

## 2019-12-23 DIAGNOSIS — F419 Anxiety disorder, unspecified: Secondary | ICD-10-CM | POA: Diagnosis present

## 2019-12-23 DIAGNOSIS — Z882 Allergy status to sulfonamides status: Secondary | ICD-10-CM | POA: Diagnosis not present

## 2019-12-23 DIAGNOSIS — Z8619 Personal history of other infectious and parasitic diseases: Secondary | ICD-10-CM | POA: Diagnosis not present

## 2019-12-23 DIAGNOSIS — G2571 Drug induced akathisia: Secondary | ICD-10-CM | POA: Diagnosis not present

## 2019-12-23 DIAGNOSIS — Z940 Kidney transplant status: Secondary | ICD-10-CM | POA: Diagnosis not present

## 2019-12-23 DIAGNOSIS — N390 Urinary tract infection, site not specified: Secondary | ICD-10-CM | POA: Diagnosis not present

## 2019-12-23 DIAGNOSIS — I120 Hypertensive chronic kidney disease with stage 5 chronic kidney disease or end stage renal disease: Secondary | ICD-10-CM | POA: Diagnosis present

## 2019-12-23 DIAGNOSIS — Z751 Person awaiting admission to adequate facility elsewhere: Secondary | ICD-10-CM | POA: Diagnosis not present

## 2019-12-23 DIAGNOSIS — N183 Chronic kidney disease, stage 3 unspecified: Secondary | ICD-10-CM | POA: Diagnosis not present

## 2019-12-23 DIAGNOSIS — Z888 Allergy status to other drugs, medicaments and biological substances status: Secondary | ICD-10-CM | POA: Diagnosis not present

## 2019-12-23 DIAGNOSIS — Z79899 Other long term (current) drug therapy: Secondary | ICD-10-CM | POA: Diagnosis not present

## 2019-12-23 DIAGNOSIS — Y92230 Patient room in hospital as the place of occurrence of the external cause: Secondary | ICD-10-CM | POA: Diagnosis not present

## 2019-12-23 DIAGNOSIS — F315 Bipolar disorder, current episode depressed, severe, with psychotic features: Secondary | ICD-10-CM | POA: Diagnosis present

## 2019-12-23 LAB — CBC
HCT: 37.5 % (ref 36.0–46.0)
HCT: 34.2 % — ABNORMAL LOW (ref 36.0–46.0)
Hemoglobin: 12 g/dL (ref 12.0–15.0)
Hemoglobin: 11.4 g/dL — ABNORMAL LOW (ref 12.0–15.0)
MCH: 29.9 pg (ref 26.0–34.0)
MCH: 30.6 pg (ref 26.0–34.0)
MCHC: 32 g/dL (ref 30.0–36.0)
MCHC: 33.3 g/dL (ref 30.0–36.0)
MCV: 93.5 fL (ref 80.0–100.0)
MCV: 91.9 fL (ref 80.0–100.0)
Platelets: 293 10*3/uL (ref 150–400)
Platelets: 291 10*3/uL (ref 150–400)
RBC: 4.01 MIL/uL (ref 3.87–5.11)
RBC: 3.72 MIL/uL — ABNORMAL LOW (ref 3.87–5.11)
RDW: 12.2 % (ref 11.5–15.5)
RDW: 12.3 % (ref 11.5–15.5)
WBC: 8.7 10*3/uL (ref 4.0–10.5)
WBC: 8.1 10*3/uL (ref 4.0–10.5)
nRBC: 0 % (ref 0.0–0.2)
nRBC: 0 % (ref 0.0–0.2)

## 2019-12-23 LAB — TROPONIN I (HIGH SENSITIVITY)
Troponin I (High Sensitivity): 6 ng/L (ref <18)
Troponin I (High Sensitivity): 6 ng/L (ref <18)

## 2019-12-23 LAB — URINALYSIS, ROUTINE W REFLEX MICROSCOPIC
Bilirubin Urine: NEGATIVE
Glucose, UA: 50 mg/dL — AB
Ketones, ur: NEGATIVE mg/dL
Nitrite: NEGATIVE
Protein, ur: NEGATIVE mg/dL
Specific Gravity, Urine: 1.01 (ref 1.005–1.030)
pH: 5 (ref 5.0–8.0)

## 2019-12-23 LAB — COMPREHENSIVE METABOLIC PANEL
ALT: 7 U/L (ref 0–44)
AST: 15 U/L (ref 15–41)
Albumin: 3.5 g/dL (ref 3.5–5.0)
Alkaline Phosphatase: 36 U/L — ABNORMAL LOW (ref 38–126)
Anion gap: 9 (ref 5–15)
BUN: 27 mg/dL — ABNORMAL HIGH (ref 8–23)
CO2: 17 mmol/L — ABNORMAL LOW (ref 22–32)
Calcium: 9.6 mg/dL (ref 8.9–10.3)
Chloride: 110 mmol/L (ref 98–111)
Creatinine, Ser: 1.24 mg/dL — ABNORMAL HIGH (ref 0.44–1.00)
GFR calc Af Amer: 52 mL/min — ABNORMAL LOW (ref >60)
GFR calc non Af Amer: 45 mL/min — ABNORMAL LOW (ref >60)
Glucose, Bld: 125 mg/dL — ABNORMAL HIGH (ref 70–99)
Potassium: 3.9 mmol/L (ref 3.5–5.1)
Sodium: 136 mmol/L (ref 135–145)
Total Bilirubin: 1 mg/dL (ref 0.3–1.2)
Total Protein: 6.8 g/dL (ref 6.5–8.1)

## 2019-12-23 LAB — D-DIMER, QUANTITATIVE: D-Dimer, Quant: 4.76 ug/mL-FEU — ABNORMAL HIGH (ref 0.00–0.50)

## 2019-12-23 LAB — PROTIME-INR
INR: 1.1 (ref 0.8–1.2)
Prothrombin Time: 13.9 seconds (ref 11.4–15.2)

## 2019-12-23 LAB — BLOOD GAS, ARTERIAL
Acid-base deficit: 5.8 mmol/L — ABNORMAL HIGH (ref 0.0–2.0)
Bicarbonate: 15.8 mmol/L — ABNORMAL LOW (ref 20.0–28.0)
Drawn by: 23532
FIO2: 21
O2 Saturation: 88.8 %
Patient temperature: 98.6
pCO2 arterial: 21.7 mmHg — ABNORMAL LOW (ref 32.0–48.0)
pH, Arterial: 7.475 — ABNORMAL HIGH (ref 7.350–7.450)
pO2, Arterial: 54.4 mmHg — ABNORMAL LOW (ref 83.0–108.0)

## 2019-12-23 LAB — TSH: TSH: 2.853 u[IU]/mL (ref 0.350–4.500)

## 2019-12-23 LAB — APTT: aPTT: 26 seconds (ref 24–36)

## 2019-12-23 LAB — HEPARIN LEVEL (UNFRACTIONATED): Heparin Unfractionated: 0.26 IU/mL — ABNORMAL LOW (ref 0.30–0.70)

## 2019-12-23 LAB — ECHOCARDIOGRAM COMPLETE
Height: 67 in
Weight: 2672 oz

## 2019-12-23 LAB — SALICYLATE LEVEL: Salicylate Lvl: <7 mg/dL — ABNORMAL LOW (ref 7.0–30.0)

## 2019-12-23 LAB — CBG MONITORING, ED: Glucose-Capillary: 141 mg/dL — ABNORMAL HIGH (ref 70–99)

## 2019-12-23 MED ORDER — LORAZEPAM 0.5 MG PO TABS
0.5000 mg | ORAL_TABLET | Freq: Two times a day (BID) | ORAL | Status: DC
  Administered 2019-12-23 – 2019-12-26 (×7): 0.5 mg via ORAL
  Filled 2019-12-23 (×7): qty 1

## 2019-12-23 MED ORDER — SODIUM BICARBONATE 8.4 % IV SOLN
50.0000 meq | Freq: Once | INTRAVENOUS | Status: DC
  Filled 2019-12-23: qty 50

## 2019-12-23 MED ORDER — SODIUM CHLORIDE 0.9 % IV SOLN: INTRAVENOUS | Status: AC

## 2019-12-23 MED ORDER — ACETAMINOPHEN 325 MG PO TABS
650.0000 mg | ORAL_TABLET | Freq: Four times a day (QID) | ORAL | Status: DC | PRN
  Administered 2019-12-23 – 2019-12-31 (×4): 650 mg via ORAL
  Filled 2019-12-23 (×4): qty 2

## 2019-12-23 MED ORDER — HEPARIN BOLUS VIA INFUSION
3000.0000 [IU] | Freq: Once | INTRAVENOUS | Status: AC
  Administered 2019-12-23: 3000 [IU] via INTRAVENOUS
  Filled 2019-12-23: qty 3000

## 2019-12-23 MED ORDER — SODIUM CHLORIDE 0.9 % IV SOLN: Freq: Once | INTRAVENOUS | Status: DC

## 2019-12-23 MED ORDER — ACETAMINOPHEN 650 MG RE SUPP: 650.0000 mg | Freq: Four times a day (QID) | RECTAL | Status: DC | PRN

## 2019-12-23 MED ORDER — DIVALPROEX SODIUM 250 MG PO DR TAB
750.0000 mg | DELAYED_RELEASE_TABLET | Freq: Every day | ORAL | Status: DC
  Administered 2019-12-23 – 2019-12-29 (×7): 750 mg via ORAL
  Filled 2019-12-23 (×7): qty 3

## 2019-12-23 MED ORDER — ENOXAPARIN SODIUM 40 MG/0.4ML ~~LOC~~ SOLN
40.0000 mg | SUBCUTANEOUS | Status: DC
  Administered 2019-12-23 – 2020-01-04 (×13): 40 mg via SUBCUTANEOUS
  Filled 2019-12-23 (×13): qty 0.4

## 2019-12-23 MED ORDER — SODIUM CHLORIDE 0.9 % IV SOLN: INTRAVENOUS | Status: DC

## 2019-12-23 MED ORDER — HEPARIN (PORCINE) 25000 UT/250ML-% IV SOLN
1300.0000 [IU]/h | INTRAVENOUS | Status: DC
  Administered 2019-12-23: 1300 [IU]/h via INTRAVENOUS

## 2019-12-23 MED ORDER — HEPARIN (PORCINE) 25000 UT/250ML-% IV SOLN
1200.0000 [IU]/h | INTRAVENOUS | Status: DC
  Administered 2019-12-23: 1200 [IU]/h via INTRAVENOUS
  Filled 2019-12-23: qty 250

## 2019-12-23 MED ORDER — SODIUM BICARBONATE 650 MG PO TABS
1300.0000 mg | ORAL_TABLET | Freq: Two times a day (BID) | ORAL | Status: DC
  Administered 2019-12-23 – 2020-01-05 (×27): 1300 mg via ORAL
  Filled 2019-12-23 (×27): qty 2

## 2019-12-23 MED ORDER — TECHNETIUM TO 99M ALBUMIN AGGREGATED
1.5000 | Freq: Once | INTRAVENOUS | Status: AC | PRN
  Administered 2019-12-23: 1.5 via INTRAVENOUS

## 2019-12-23 NOTE — Consult Note (Signed)
Telepsych Consultation   Reason for Consult:  Suicidal ideation Referring Physician: Dr. Jani Gravel Location of Patient: [facility] Telemetry Location of Provider: [facility]  Patient Identification: Brenda Nolan MRN:  [id number] Principal Diagnosis: Bipolar 1 disorder, depressed ([place]) Diagnosis:  Principal Problem:   Bipolar 1 disorder, depressed ([place]) Active Problems:   Tachycardia   Total Time spent with patient: 45 minutes  Subjective:   Brenda Nolan is a 69 y.o. female patient whom presented to [facility] with complaints of suicidal ideation no specific plan, and auditory/visual hallucinations.  Patient seen daily by psychiatry for assessment of stabilization, safety, and psychotropic medication management.  On 12/22/2019 patient appeared to be more confused with labored breathing.  Patient continued to verbalized "I died."  Psychiatry would continue to seek geropsychiatry placement after she is medically cleared.    HPI:  Brenda Nolan, 69 y.o., female patient seen via tele psych by Sheran Fava, PMHNP,  this provider, and Dr. Dwyane Dee; and chart reviewed on 12/23/19.  On evaluation Brenda Nolan reports she has been feeling bad since the pandemic.  "I committed suicide, cause I quit the job cause I was hurting so bad."  Patient reported that she had 2 sons and that she lived with her sister.  Reported a history of bipolar disorder and that Dr. Erling Cruz was her psychiatrist.  When asked about suicidality, homicidal, and auditory/visual hallucinations patient answered yes to all.  Patient appeared to be restless with the movement of her neck, head, and smacking of lips, rolling of tongue which could be possibly due to akathisia or drug induced extrapyramidal symptoms from the psychotropic medication.  Started Ativan 0.5 mg Bid for EPS.  Patient also appeared to have some respiratory distress through out assessment.  Labs/EKG reviewed (abnormal) patient nurse notified of findings.  Will  continue to monitor psychotropic medications/management.  Once patient has been medically cleared will reassess for geropsychiatric hospitalization.  Patients mental health condition could be a result of her medical illness at this time.  Patient gave permission to speak to her son Remo Lipps.    Spoke to patients son Achille Rich at [phone]) who reports that his mother was living in an assisted living but recent pneumonia infection caused her to be place in a rehab facility before she could return to the assisted living.  He states that he is concerned about his mothers mental and medical health.  Informed that his mother was medically admitted to hospital related to medical issues and possible infection which can also cause the elderly to present with mental issues that may be a result of the infection or other medical issues.  Understanding voiced.  Also informed that would continue to monitor patient psychotropic medications and once his mother has been medically cleared would reassess to see if psychiatric hospitalization was needed.     Past Psychiatric History:  Reports history of bipolar disorder     Risk to Self: Suicidal Ideation: Yes-Currently Present Suicidal Intent: No Is patient at risk for suicide?: (See notes) Suicidal Plan?: No(See notes) What has been your use of drugs/alcohol within the last 12 months?: Denied How many times?: 1 Intentional Self Injurious Behavior: None Risk to Others: Homicidal Ideation: No Thoughts of Harm to Others: No Current Homicidal Intent: No Current Homicidal Plan: No Access to Homicidal Means: No History of harm to others?: No Assessment of Violence: None Noted Does patient have access to weapons?: No Criminal Charges Pending?: No Does patient have a court date: No Prior Inpatient Therapy:  Prior Inpatient Therapy: Yes Prior Therapy Dates: 2020 Prior Therapy Facilty/Provider(s): [facility] Reason for Treatment: [place] mental status Prior  Outpatient Therapy: Prior Outpatient Therapy: Yes Prior Therapy Dates: Ongoing Prior Therapy Facilty/Provider(s): Unsure -- provider is psychiatrist at [facility] Reason for Treatment: Bipolar Does patient have an ACCT team?: No Does patient have Intensive In-House Services?  : No Does patient have [facility] services? : No Does patient have P4CC services?: No  Past Medical History:  Past Medical History:  Diagnosis Date  . Anxiety 2019  . Bipolar 1 disorder ([place]) 2019  . Chronic kidney disease (CKD)    Stage 1  . Difficulty in walking, not elsewhere classified   . Dysphagia, oropharyngeal phase   . Enterocolitis due to Clostridioides difficile 2019  . Hypothyroidism 2019  . Kidney transplant status   . Muscle weakness   . Urinary tract infection    History reviewed. No pertinent surgical history. Family History: History reviewed. No pertinent family history. Family Psychiatric  History: Unaware Social History:  Social History   Substance and Sexual Activity  Alcohol Use Not Currently     Social History   Substance and Sexual Activity  Drug Use Never    Social History   Socioeconomic History  . Marital status: Married    Spouse name: Not on file  . Number of children: Not on file  . Years of education: Not on file  . Highest education level: Not on file  Occupational History  . Occupation: Retired  Tobacco Use  . Smoking status: Never Smoker  . Smokeless tobacco: Never Used  Substance and Sexual Activity  . Alcohol use: Not Currently  . Drug use: Never  . Sexual activity: Not Currently  Other Topics Concern  . Not on file  Social History Narrative   Pt currently resides at [facility] where she is being treated for an infection.  Pt receives outpatient psychiatric services through a provider at [facility].   Social Determinants of Health   Financial Resource Strain:   . Difficulty of Paying Living Expenses: Not on file  Food Insecurity:   . Worried About Ship broker in the Last Year: Not on file  . Ran Out of Food in the Last Year: Not on file  Transportation Needs:   . Lack of Transportation (Medical): Not on file  . Lack of Transportation (Non-Medical): Not on file  Physical Activity:   . Days of Exercise per Week: Not on file  . Minutes of Exercise per Session: Not on file  Stress:   . Feeling of Stress : Not on file  Social Connections:   . Frequency of Communication with Friends and Family: Not on file  . Frequency of Social Gatherings with Friends and Family: Not on file  . Attends Religious Services: Not on file  . Active Member of Clubs or Organizations: Not on file  . Attends Archivist Meetings: Not on file  . Marital Status: Not on file   Additional Social History:    Allergies:   Allergies  Allergen Reactions  . Atorvastatin Other (See Comments)    Myalgias  . Ketoconazole Rash  . Abilify [Aripiprazole] Other (See Comments)    Unknown  . Ibuprofen Other (See Comments)    Unknown  . Levaquin [Levofloxacin] Other (See Comments)    Unknown  . Sulfa Antibiotics Other (See Comments)    Unknown  . Sulfamethoxazole Other (See Comments)    Unknown    Labs:  Results for  orders placed or performed during the hospital encounter of 12/17/19 (from the past 48 hour(s))  Basic metabolic panel     Status: Abnormal   Collection Time: 12/21/19  3:26 PM  Result Value Ref Range   Sodium 141 135 - 145 mmol/L   Potassium 4.5 3.5 - 5.1 mmol/L    Comment: DELTA CHECK NOTED NO VISIBLE HEMOLYSIS    Chloride 112 (H) 98 - 111 mmol/L   CO2 20 (L) 22 - 32 mmol/L   Glucose, Bld 122 (H) 70 - 99 mg/dL   BUN 29 (H) 8 - 23 mg/dL   Creatinine, Ser 1.43 (H) 0.44 - 1.00 mg/dL   Calcium 9.4 8.9 - 10.3 mg/dL   GFR calc non Af Amer 38 (L) >60 mL/min   GFR calc Af Amer 44 (L) >60 mL/min   Anion gap 9 5 - 15    Comment: Performed at [facility], [place] [address], [place], [place] [address]  Magnesium     Status:  None   Collection Time: 12/21/19  3:26 PM  Result Value Ref Range   Magnesium 2.1 1.7 - 2.4 mg/dL    Comment: Performed at [facility], [place] [address] [place], [place] [address]  TSH     Status: None   Collection Time: 12/21/19  3:26 PM  Result Value Ref Range   TSH 2.535 0.350 - 4.500 uIU/mL    Comment: Performed by a 3rd Generation assay with a functional sensitivity of <=0.01 uIU/mL. Performed at [facility], [place] [address] [place], [place] [address]   T4, free     Status: Abnormal   Collection Time: 12/21/19  3:26 PM  Result Value Ref Range   Free T4 1.56 (H) 0.61 - 1.12 ng/dL    Comment: (NOTE) Biotin ingestion may interfere with free T4 tests. If the results are inconsistent with the TSH level, previous test results, or the clinical presentation, then consider biotin interference. If needed, order repeat testing after stopping biotin. Performed at [facility], [place] [address], [place], [place] [address]   Lactic acid, plasma     Status: None   Collection Time: 12/21/19  3:26 PM  Result Value Ref Range   Lactic Acid, Venous 1.3 0.5 - 1.9 mmol/L    Comment: Performed at [facility], [place] [address] [place], [place] [address]  Valproic acid level     Status: Abnormal   Collection Time: 12/22/19 10:10 PM  Result Value Ref Range   Valproic Acid Lvl 19 (L) 50.0 - 100.0 ug/mL    Comment: Performed at [facility], [place] [address] [place], [place] [address]  Ammonia     Status: None   Collection Time: 12/22/19 10:10 PM  Result Value Ref Range   Ammonia 28 9 - 35 umol/L    Comment: Performed at [facility], [place] [address], [place],  [address]  Blood gas, venous (at [facility] and [facility], not at [facility])     Status: Abnormal   Collection Time: 12/22/19 10:10 PM  Result Value Ref Range   pH, Ven 7.619 (HH) 7.250 - 7.430    Comment: CRITICAL RESULT CALLED TO, READ BACK BY AND  VERIFIED WITH: B.BROOKS AT 2251 ON 12/22/19 BY N.THOMPSON    pCO2, Ven BELOW REPORTABLE RANGE 44.0 - 60.0 mmHg   pO2, Ven 174.0 (H) 32.0 - 45.0 mmHg   O2 Saturation 99.5 %   Patient temperature 98.6  Comment: Performed at [facility], [place] [address] [place], [place] [id number]  Basic metabolic panel     Status: Abnormal   Collection Time: 12/22/19 10:10 PM  Result Value Ref Range   Sodium 140 135 - 145 mmol/L   Potassium 4.6 3.5 - 5.1 mmol/L   Chloride 114 (H) 98 - 111 mmol/L   CO2 13 (L) 22 - 32 mmol/L   Glucose, Bld 115 (H) 70 - 99 mg/dL   BUN 28 (H) 8 - 23 mg/dL   Creatinine, Ser 1.36 (H) 0.44 - 1.00 mg/dL   Calcium 9.6 8.9 - 10.3 mg/dL   GFR calc non Af Amer 40 (L) >60 mL/min   GFR calc Af Amer 47 (L) >60 mL/min   Anion gap 13 5 - 15    Comment: Performed at [facility], [place] [address], [place], [place] [id number]  Salicylate level     Status: Abnormal   Collection Time: 12/22/19 11:59 PM  Result Value Ref Range   Salicylate Lvl [id number] (L) 7.0 - 30.0 mg/dL    Comment: Performed at [facility], [place] [address], [place], [place] [address]  Blood gas, arterial (at [facility] & [facility])     Status: Abnormal   Collection Time: 12/23/19 12:29 AM  Result Value Ref Range   FIO2 21.00    pH, Arterial 7.475 (H) 7.350 - 7.450   pCO2 arterial 21.7 (L) 32.0 - 48.0 mmHg   pO2, Arterial 54.4 (L) 83.0 - 108.0 mmHg   Bicarbonate 15.8 (L) 20.0 - 28.0 mmol/L   Acid-base deficit 5.8 (H) 0.0 - 2.0 mmol/L   O2 Saturation 88.8 %   Patient temperature 98.6    Collection site RIGHT RADIAL    Drawn by [id number]    Sample type ARTERIAL    Allens test (pass/fail) PASS PASS    Comment: Performed at [facility], [place] [address], [place], [place] [address]  CBG monitoring, ED     Status: Abnormal   Collection Time: 12/23/19 12:41 AM  Result Value Ref Range   Glucose-Capillary 141 (H) 70 - 99 mg/dL  Troponin I (High Sensitivity)      Status: None   Collection Time: 12/23/19  1:53 AM  Result Value Ref Range   Troponin I (High Sensitivity) 6 <18 ng/L    Comment: (NOTE) Elevated high sensitivity troponin I (hsTnI) values and significant  changes across serial measurements may suggest ACS but many other  chronic and acute conditions are known to elevate hsTnI results.  Refer to the "Links" section for chest pain algorithms and additional  guidance. Performed at [facility], [place] [address], [place], [place] [address]   D-dimer, quantitative (not at [facility])     Status: Abnormal   Collection Time: 12/23/19  1:53 AM  Result Value Ref Range   D-Dimer, Quant 4.76 (H) 0.00 - 0.50 ug/mL-FEU    Comment: (NOTE) At the manufacturer cut-off of 0.50 ug/mL FEU, this assay has been documented to exclude PE with a sensitivity and negative predictive value of 97 to 99%.  At this time, this assay has not been approved by the FDA to exclude DVT/VTE. Results should be correlated with clinical presentation. Performed at [facility], [place] [address], [place], [place] [address]   Comprehensive metabolic panel     Status: Abnormal   Collection Time: 12/23/19  3:07 AM  Result Value Ref Range   Sodium 136 135 - 145 mmol/L  Potassium 3.9 3.5 - 5.1 mmol/L   Chloride 110 98 - 111 mmol/L   CO2 17 (L) 22 - 32 mmol/L   Glucose, Bld 125 (H) 70 - 99 mg/dL   BUN 27 (H) 8 - 23 mg/dL   Creatinine, Ser 1.24 (H) 0.44 - 1.00 mg/dL   Calcium 9.6 8.9 - 10.3 mg/dL   Total Protein 6.8 6.5 - 8.1 g/dL   Albumin 3.5 3.5 - 5.0 g/dL   AST 15 15 - 41 U/L   ALT 7 0 - 44 U/L   Alkaline Phosphatase 36 (L) 38 - 126 U/L   Total Bilirubin 1.0 0.3 - 1.2 mg/dL   GFR calc non Af Amer 45 (L) >60 mL/min   GFR calc Af Amer 52 (L) >60 mL/min   Anion gap 9 5 - 15    Comment: Performed at [facility] [place] [address] [place], [place] [address]  CBC     Status: None   Collection Time: 12/23/19  3:07 AM   Result Value Ref Range   WBC 8.7 4.0 - 10.5 K/uL   RBC 4.01 3.87 - 5.11 MIL/uL   Hemoglobin 12.0 12.0 - 15.0 g/dL   HCT 37.5 36.0 - 46.0 %   MCV 93.5 80.0 - 100.0 fL   MCH 29.9 26.0 - 34.0 pg   MCHC 32.0 30.0 - 36.0 g/dL   RDW 12.2 11.5 - 15.5 %   Platelets 293 150 - 400 K/uL   nRBC 0.0 0.0 - 0.2 %    Comment: Performed at [facility], [place] [address] [place], [place] [address]  TSH     Status: None   Collection Time: 12/23/19  3:07 AM  Result Value Ref Range   TSH 2.853 0.350 - 4.500 uIU/mL    Comment: Performed by a 3rd Generation assay with a functional sensitivity of <=0.01 uIU/mL. Performed at [facility], [place] [address] [place], [place] [address]   APTT     Status: None   Collection Time: 12/23/19  4:16 AM  Result Value Ref Range   aPTT 26 24 - 36 seconds    Comment: Performed at [facility], [place] [address], [place], [place] [address]  Protime-INR     Status: None   Collection Time: 12/23/19  4:16 AM  Result Value Ref Range   Prothrombin Time 13.9 11.4 - 15.2 seconds   INR 1.1 0.8 - 1.2    Comment: (NOTE) INR goal varies based on device and disease states. Performed at [facility], [place] [address] [place], [place] [address]   Troponin I (High Sensitivity)     Status: None   Collection Time: 12/23/19  5:56 AM  Result Value Ref Range   Troponin I (High Sensitivity) 6 <18 ng/L    Comment: (NOTE) Elevated high sensitivity troponin I (hsTnI) values and significant  changes across serial measurements may suggest ACS but many other  chronic and acute conditions are known to elevate hsTnI results.  Refer to the "Links" section for chest pain algorithms and additional  guidance. Performed at [facility], [place] [address] [place], [place] [address]     Medications:  Current Facility-Administered Medications  Medication Dose Route Frequency Provider Last Rate Last Admin   . 0.9 %  sodium chloride infusion   Intravenous Once Charlesetta Shanks, MD      . acetaminophen (TYLENOL) tablet 650 mg  650 mg Oral Q6H PRN Jani Gravel, MD  Or  . acetaminophen (TYLENOL) suppository 650 mg  650 mg Rectal Q6H PRN Jani Gravel, MD      . amLODipine (NORVASC) tablet 5 mg  5 mg Oral Once Couture, Cortni S, PA-C   Stopped at 12/17/19 2358  . cycloSPORINE (SANDIMMUNE) capsule 125 mg  125 mg Oral q morning - 10a Couture, Cortni S, PA-C   125 mg at 12/23/19 0851  . cycloSPORINE (SANDIMMUNE) capsule 150 mg  150 mg Oral QHS Couture, Cortni S, PA-C   150 mg at 12/22/19 2011  . divalproex (DEPAKOTE) DR tablet 750 mg  750 mg Oral QHS Rankin, Shuvon B, NP      . doxazosin (CARDURA) tablet 1 mg  1 mg Oral QHS Couture, Cortni S, PA-C   1 mg at 12/22/19 2012  . heparin ADULT infusion 100 units/mL (25000 units/210mL sodium chloride 0.45%)  1,200 Units/hr Intravenous Continuous Dorrene [place], RPH 12 mL/hr at 12/23/19 0515 1,200 Units/hr at 12/23/19 0515  . levothyroxine (SYNTHROID) tablet 50 mcg  50 mcg Oral Q0600 Couture, Cortni S, PA-C   50 mcg at 12/23/19 0530  . LORazepam (ATIVAN) tablet 0.5 mg  0.5 mg Oral BID Rankin, Shuvon B, NP      . magnesium oxide (MAG-OX) tablet 400 mg  400 mg Oral Daily Couture, Cortni S, PA-C   400 mg at 12/23/19 0853  . metoprolol tartrate (LOPRESSOR) tablet 100 mg  100 mg Oral BID Couture, Cortni S, PA-C   100 mg at 12/23/19 0852  . mycophenolate (CELLCEPT) capsule 250 mg  250 mg Oral BID Couture, Cortni S, PA-C   250 mg at 12/23/19 0853  . OLANZapine (ZYPREXA) tablet 5 mg  5 mg Oral QHS Couture, Cortni S, PA-C   5 mg at 12/22/19 2010  . sodium bicarbonate injection 50 mEq  50 mEq Intravenous Once Jani Gravel, MD      . thiamine tablet 100 mg  100 mg Oral Daily Couture, Cortni S, PA-C   100 mg at 12/23/19 [id number]   Or  . thiamine (B-1) injection 100 mg  100 mg Intravenous Daily Couture, Cortni S, PA-C   100 mg at 12/20/19 [id number]    Musculoskeletal: Strength &  Muscle Tone: Unable to determine via [place]: did not see patient ambulate  Patient leans: N/A  Psychiatric Specialty Exam: Physical Exam  Nursing note and vitals reviewed. Cardiovascular:  EKG shows tachycardia 124, QTc 492,  Respiratory: She is in respiratory distress.  Patient also appeared to tachypenic.  Blood gas shows metabolic alkalosis with respiratory compensation, elevated D-dimer  Physical exam deferred to tele psych exam    Review of Systems  Unable to perform ROS: Acuity of condition  Psychiatric/Behavioral:       Patient responded yes to everything.  When asked if she was suicidal, homicidal, and auditory/visual hallucinations "yes"  Patient was unable to tell what auditory or visual hallucinations she was having.       Blood pressure (!) 153/99, pulse (!) 128, temperature 97.6 F (36.4 C), temperature source Oral, resp. rate (!) 24, height 5\' 7"  (1.702 m), weight 75.8 kg, SpO2 99 %.Body mass index is 26.16 kg/m.  General Appearance: Hospital gown  Eye Contact:  Good  Speech:  Patient appeared to have difficulty breathing which caused slowed and difficulty responding to questions (breathless)  Volume:  Decreased  Mood:  Anxious  Affect:  Anxious  Thought Process:  Linear  Orientation:  Other:  Oriented to person and place  Thought Content:  Unable to determine at this time related to patient answering yes to everything.  Patient does not appear to be responding to internal/external stimuli at this time  Suicidal Thoughts:  Patient stating that "I died"   Homicidal Thoughts:  Answered yes but unsure if patient is homicidal or suicidal   Memory:  Immediate;   Poor Recent;   Poor Remote;   Poor  Judgement:  Impaired  Insight:  Lacking  Psychomotor Activity:  Restlessness  Concentration:  Unable to assess at this time  Recall:  Poor  Fund of Knowledge:  Unable to assess  Language:  Fair  Akathisia:  Yes  Patient appears restless, movement of  neck, head, and smacking of lips, rolling of tongue.  Started Ativan 0.5 mg bid for EPS  Handed:  Right  AIMS (if indicated):     Assets:  Social Support  ADL's:  Impaired  Cognition:  Impaired,  Moderate  Sleep:       Treatment Plan Summary: Plan Will continue to assess psychotropic medication and management.  Will reassess for psychiatric hospitalization once patient has been medically cleared Will increase depakote 750mg  po at bedtime, and obtain depakote level in 5 days if she remains in the hospital. Will continue zyprexa for hallucinations, and start Ativan 0,5mg  po BID. Patient noted to have abnormal involuntary movements this maybe due to TD or parkinson (history or baseline of movements is limited as well as phsyical exam). Please consider medical conidtions and other infectious processes that could be exacerbating her psychiatric symptoms.   Disposition: Will reassess need for psychiatric hospitalization once patient has been medically cleared.   This service was provided via telemedicine using a 2-way, interactive audio and video technology.  Names of all persons participating in this telemedicine service and their role in this encounter. Name: Earleen Newport Role: NP  Name: Sheran Fava Role: PMHNP  Name: Dr. Parke Poisson Role: Psychiatrist  Name: Brenda Nolan Role: Patient   Name: Achille Rich Role: Patients son   Sheran Fava, [place], [place], [place] 12/23/2019 11:19 AM

## 2019-12-23 NOTE — Progress Notes (Addendum)
   Vital Signs MEWS/VS Documentation      12/23/2019 0239 12/23/2019 0250 12/23/2019 0311 12/23/2019 0515   MEWS Score:  2  2  2  2    MEWS Score Color:  Yellow  Yellow  Yellow  Yellow   Resp:  --  --  20  (!) 24   Pulse:  --  --  (!) 116  (!) 110   BP:  --  --  (!) 155/112  129/90   Temp:  --  --  98.2 F (36.8 C)  99.3 F (37.4 C)   O2 Device:  --  --  Room Air  Room Air   Level of Consciousness:  --  Alert  --  --     [name] scored yellow2 [place] this am  Resting quietly in bed with eyes closed. Easily aroused to name. No c/o voiced at this time. Denies CP, SOB, N/V and diarrhea when questioned. [facility] notified of the above MEWS scored. Will continue to monitor and assess patient needs and concerns.Don Perking 12/23/2019,6:53 AM

## 2019-12-23 NOTE — Progress Notes (Signed)
Sitter not at bedside at time of report. Per 0208 note by MD Maudie Mercury, pt is to have a sitter for safety concerns. Pt continues to express suicidal thoughts and concerns, repeating, "I don't want to commit suicide. I'm already dead. I don't want to get pregnant." When asked if pt has a plan or any specific details regarding suicidal ideations, pt does not respond. Pt shaking, tachycardic, appears to be scared of something. States the devil has been talking to her. Pt repeatedly calling for help. CN aware and agrees a SI sitter needs to be provided per protocol. Secure chat sent to MD Dwyane Dee to be made aware. CN to contact AC to request sitter. Will continue to monitor pt closely.

## 2019-12-23 NOTE — H&P (Addendum)
TRH H&P    Patient Demographics:    Brenda Nolan, is a 69 y.o. female  MRN: [id number]  DOB - Oct 29, 1951  Admit Date - 12/17/2019  Referring MD/NP/PA:  Charlesetta Shanks  Outpatient Primary MD for the patient is Patient, No Pcp Per  Patient coming from:   ?   Chief complaint-   Tachycardia, chest pain    HPI:    Brenda Nolan  is a 69 y.o. female, anxiety, bipolar 1 disorders, stage IIIb chronic kidney disease, unspecified difficulty walking history, oropharyngeal phase dysphagia, history of C. difficile colitis, w recent admission 11/29/19 for AMS secondary to UTI, apparently has been waiting in ED for placement to [facility] but apparently has tachycardia and c/o chest pain tonight.  Pt notes that chest pain x2+ weeks but is a poor historian. Pt tends to say yes when prompted with questions.  Pt is unable to describe her pain. Pt states has had diarrhea but nurses have not noticed any diarrhea.   Pt denies fever, chills, cough, palp, sob, n/v, brbpr, black stool.   In Ed,  T 98.3, P 95 R 22, Bp 125/82,  Pox 96% on RA initially Currently  T 98.6, P 126, R 20, Bp 157/106, pox 98% on RA  Na 140, K 4.6, Bun 28, Creatinine 1.36 Hco3 13, AG 13 Salycilate level <7 PH 7.475, Pco2 21.7, pO2 54.4  Pt will be admitted for tachycardia, chest pain, and also NonAG metabolic acidosis      Review of systems:    In addition to the HPI above,  No Fever-chills, No Headache, No changes with Vision or hearing, No problems swallowing food or Liquids, No  Cough or Shortness of Breath, No Abdominal pain, No Nausea or Vomiting, bowel movements are regular, No Blood in stool or Urine, No dysuria, No new skin rashes or bruises, No new joints pains-aches,  No new weakness, tingling, numbness in any extremity, No recent weight gain or loss, No polyuria, polydypsia or polyphagia, No significant Mental  Stressors.  All other systems reviewed and are negative.    Past History of the following :    Past Medical History:  Diagnosis Date  . Anxiety 2019  . Bipolar 1 disorder ([place]) 2019  . Chronic kidney disease (CKD)    Stage 1  . Difficulty in walking, not elsewhere classified   . Dysphagia, oropharyngeal phase   . Enterocolitis due to Clostridioides difficile 2019  . Hypothyroidism 2019  . Kidney transplant status   . Muscle weakness   . Urinary tract infection       History reviewed. No pertinent surgical history. Renal transplant   Social History:      Social History   Tobacco Use  . Smoking status: Never Smoker  . Smokeless tobacco: Never Used  Substance Use Topics  . Alcohol use: Not Currently       Family History :    History reviewed. No pertinent family history. Pt unable to provide history    Home Medications:   Prior to Admission medications  Medication Sig Start Date End Date Taking? Authorizing Provider  amLODipine (NORVASC) 5 MG tablet Take 5 mg by mouth daily.   Yes [provider]  clonazePAM (KLONOPIN) 1 MG tablet Take 1 tablet (1 mg total) by mouth daily as needed for up to 10 doses (For nervousness). 12/06/19  Yes Darliss Cheney, MD  cycloSPORINE (SANDIMMUNE) 25 MG capsule Take 125 mg by mouth daily.   Yes [provider]  cycloSPORINE (SANDIMMUNE) 25 MG capsule Take 150 mg by mouth at bedtime.   Yes [provider]  divalproex (DEPAKOTE ER) 500 MG 24 hr tablet Take 500 mg by mouth at bedtime.   Yes [provider]  doxazosin (CARDURA) 1 MG tablet Take 1 mg by mouth at bedtime.   Yes [provider]  fenofibrate 160 MG tablet Take 160 mg by mouth daily.   Yes [provider]  levothyroxine (SYNTHROID) 50 MCG tablet Take 50 mcg by mouth daily before breakfast.   Yes [provider]  LORazepam (ATIVAN) 1 MG tablet Take 1 tablet (1 mg total) by mouth 3 (three) times daily as needed for  anxiety. 12/16/19  Yes Daleen Bo, MD  Magnesium 400 MG TABS Take 1 tablet by mouth daily.   Yes [provider]  megestrol (MEGACE) 40 MG/ML suspension Take 20 mg by mouth daily.   Yes [provider]  metoprolol tartrate (LOPRESSOR) 100 MG tablet Take 100 mg by mouth 2 (two) times daily.   Yes [provider]  Multiple Vitamin (MULTIVITAMIN) tablet Take 1 tablet by mouth daily.   Yes [provider]  mycophenolate (CELLCEPT) 250 MG capsule Take 250 mg by mouth 2 (two) times daily.   Yes [provider]  OLANZapine (ZYPREXA) 2.5 MG tablet Take 1 tablet (2.5 mg total) by mouth daily. 12/06/19 01/05/20 Yes Pahwani, Einar Grad, MD  OLANZapine (ZYPREXA) 5 MG tablet Take 1 tablet (5 mg total) by mouth at bedtime. 12/06/19 01/05/20 Yes Darliss Cheney, MD     Allergies:     Allergies  Allergen Reactions  . Atorvastatin Other (See Comments)    Myalgias  . Ketoconazole Rash  . Abilify [Aripiprazole] Other (See Comments)    Unknown  . Ibuprofen Other (See Comments)    Unknown  . Levaquin [Levofloxacin] Other (See Comments)    Unknown  . Sulfa Antibiotics Other (See Comments)    Unknown  . Sulfamethoxazole Other (See Comments)    Unknown     Physical Exam:   Vitals  Blood pressure (!) 157/106, pulse (!) 126, temperature 98.6 F (37 C), temperature source Oral, resp. rate 20, SpO2 98 %.  1.  General: axoxo1  2. Psychiatric: euthymic  3. Neurologic: Cn2-12 intact, reflexes 2+ symmetric, diffuse with no clonus, motor 5/5 in all 4 ext  4. HEENMT:  Anicteric, pupils 1.41mm symmetric, direct, consensual intact Neck: no jvd  5. Respiratory : CTAB  6. Cardiovascular : Tachycardia,  s1, s2, no m/g/r  7. Gastrointestinal:  Abd: soft, nt, nd, +bs  8. Skin:  Ext: no c/c/e, no rash  9.Musculoskeletal:  Good ROM    Data Review:    CBC Recent Labs  Lab 12/16/19 1358 12/17/19 1505 12/20/19 0941  WBC 12.2* 13.4* 11.1*  HGB 13.2 13.0  11.9*  HCT 42.9 39.5 34.9*  PLT 323 295 242  MCV 98.6 94.5 91.1  MCH 30.3 31.1 31.1  MCHC 30.8 32.9 34.1  RDW 12.5 12.5 12.1  LYMPHSABS 1.5 1.9 1.7  MONOABS 1.0 1.1* 0.7  EOSABS 0.1 0.1 0.1  BASOSABS 0.1 0.1 0.0   ------------------------------------------------------------------------------------------------------------------  Results for orders placed or performed during the hospital encounter of 12/17/19 (from the past 48 hour(s))  Basic metabolic panel     Status: Abnormal   Collection Time: 12/21/19  3:26 PM  Result Value Ref Range   Sodium 141 135 - 145 mmol/L   Potassium 4.5 3.5 - 5.1 mmol/L    Comment: DELTA CHECK NOTED NO VISIBLE HEMOLYSIS    Chloride 112 (H) 98 - 111 mmol/L   CO2 20 (L) 22 - 32 mmol/L   Glucose, Bld 122 (H) 70 - 99 mg/dL   BUN 29 (H) 8 - 23 mg/dL   Creatinine, Ser 1.43 (H) 0.44 - 1.00 mg/dL   Calcium 9.4 8.9 - 10.3 mg/dL   GFR calc non Af Amer 38 (L) >60 mL/min   GFR calc Af Amer 44 (L) >60 mL/min   Anion gap 9 5 - 15    Comment: Performed at [facility], [place] [address] [place], [place] [address]  Magnesium     Status: None   Collection Time: 12/21/19  3:26 PM  Result Value Ref Range   Magnesium 2.1 1.7 - 2.4 mg/dL    Comment: Performed at [facility], [place] [address] [place], [place] [address]  TSH     Status: None   Collection Time: 12/21/19  3:26 PM  Result Value Ref Range   TSH 2.535 0.350 - 4.500 uIU/mL    Comment: Performed by a 3rd Generation assay with a functional sensitivity of <=0.01 uIU/mL. Performed at [facility], [place] [address] [place], [place] [address]   T4, free     Status: Abnormal   Collection Time: 12/21/19  3:26 PM  Result Value Ref Range   Free T4 1.56 (H) 0.61 - 1.12 ng/dL    Comment: (NOTE) Biotin ingestion may interfere with free T4 tests. If the results are inconsistent with the TSH level, previous test results, or the clinical presentation, then  consider biotin interference. If needed, order repeat testing after stopping biotin. Performed at [facility], [place] [address] [place], [place] [address]   Lactic acid, plasma     Status: None   Collection Time: 12/21/19  3:26 PM  Result Value Ref Range   Lactic Acid, Venous 1.3 0.5 - 1.9 mmol/L    Comment: Performed at [facility], [place] [address] [place], [place] [address]  Valproic acid level     Status: Abnormal   Collection Time: 12/22/19 10:10 PM  Result Value Ref Range   Valproic Acid Lvl 19 (L) 50.0 - 100.0 ug/mL    Comment: Performed at [facility], [place] [address] [place], [place] [address]  Ammonia     Status: None   Collection Time: 12/22/19 10:10 PM  Result Value Ref Range   Ammonia 28 9 - 35 umol/L    Comment: Performed at [facility], [place] [address], [place], [place] [address]  Blood gas, venous (at [facility] and [facility], not at [facility])     Status: Abnormal   Collection Time: 12/22/19 10:10 PM  Result Value Ref Range   pH, Ven 7.619 (HH) 7.250 - 7.430    Comment: CRITICAL RESULT CALLED TO, READ BACK BY AND VERIFIED WITH: B.BROOKS AT 2251 ON 12/22/19 BY N.THOMPSON    pCO2, Ven BELOW REPORTABLE RANGE 44.0 - 60.0 mmHg   pO2, Ven 174.0 (H) 32.0 - 45.0  mmHg   O2 Saturation 99.5 %   Patient temperature 98.6     Comment: Performed at [place] [address] [place], [place] [id number]  Basic metabolic panel     Status: Abnormal   Collection Time: 12/22/19 10:10 PM  Result Value Ref Range   Sodium 140 135 - 145 mmol/L   Potassium 4.6 3.5 - 5.1 mmol/L   Chloride 114 (H) 98 - 111 mmol/L   CO2 13 (L) 22 - 32 mmol/L   Glucose, Bld 115 (H) 70 - 99 mg/dL   BUN 28 (H) 8 - 23 mg/dL   Creatinine, Ser 1.36 (H) 0.44 - 1.00 mg/dL   Calcium 9.6 8.9 - 10.3 mg/dL   GFR calc non Af Amer 40 (L) >60 mL/min   GFR calc Af Amer 47 (L) >60 mL/min   Anion gap 13 5 - 15    Comment: Performed at [facility], [place] [address] [place], [place] [id number]  Salicylate level     Status: Abnormal   Collection Time: 12/22/19 11:59 PM  Result Value Ref Range   Salicylate Lvl [id number] (L) 7.0 - 30.0 mg/dL    Comment: Performed at [facility], [place] [address] [place], [place] [address]  Blood gas, arterial (at [facility])     Status: Abnormal   Collection Time: 12/23/19 12:29 AM  Result Value Ref Range   FIO2 21.00    pH, Arterial 7.475 (H) 7.350 - 7.450   pCO2 arterial 21.7 (L) 32.0 - 48.0 mmHg   pO2, Arterial 54.4 (L) 83.0 - 108.0 mmHg   Bicarbonate 15.8 (L) 20.0 - 28.0 mmol/L   Acid-base deficit 5.8 (H) 0.0 - 2.0 mmol/L   O2 Saturation 88.8 %   Patient temperature 98.6    Collection site RIGHT RADIAL    Drawn by [id number]    Sample type ARTERIAL    Allens test (pass/fail) PASS PASS    Comment: Performed at [facility], [place] [address], [place],  [address]  CBG monitoring, ED     Status: Abnormal   Collection Time: 12/23/19 12:41 AM  Result Value Ref Range   Glucose-Capillary 141 (H) 70 - 99 mg/dL    Chemistries  Recent Labs  Lab 12/16/19 1358 12/17/19 1505 12/20/19 0941 12/21/19 1526 12/22/19 2210  NA 147* 140 139 141 140  K 4.6 4.4 3.7 4.5 4.6  CL 120* 113* 112* 112* 114*  CO2 16* 18* 16* 20* 13*  GLUCOSE 120* 93 141* 122* 115*  BUN 33* 33* 34* 29* 28*  CREATININE 1.29* 1.35* 1.44* 1.43* 1.36*  CALCIUM 9.8 9.5 9.5 9.4 9.6  MG  --   --   --  2.1  --   AST  --  15 19  --   --   ALT  --  8 7  --   --   ALKPHOS  --  29* 30*  --   --   BILITOT  --  0.6 0.4  --   --    ------------------------------------------------------------------------------------------------------------------  ------------------------------------------------------------------------------------------------------------------ GFR: Estimated Creatinine Clearance: 42.2 mL/min (A) (by C-G formula based on SCr of 1.36 mg/dL (H)). Liver Function Tests: Recent  Labs  Lab 12/17/19 1505 12/20/19 0941  AST 15 19  ALT 8 7  ALKPHOS 29* 30*  BILITOT 0.6 0.4  PROT 7.1 6.6  ALBUMIN 3.6 3.3*   No results for input(s): LIPASE, AMYLASE in the last 168 hours. Recent Labs  Lab  12/22/19 2210  AMMONIA 28   Coagulation Profile: No results for input(s): INR, PROTIME in the last 168 hours. Cardiac Enzymes: No results for input(s): CKTOTAL, CKMB, CKMBINDEX, TROPONINI in the last 168 hours. BNP (last 3 results) No results for input(s): PROBNP in the last 8760 hours. HbA1C: No results for input(s): HGBA1C in the last 72 hours. CBG: Recent Labs  Lab 12/23/19 0041  GLUCAP 141*   Lipid Profile: No results for input(s): CHOL, HDL, LDLCALC, TRIG, CHOLHDL, LDLDIRECT in the last 72 hours. Thyroid Function Tests: Recent Labs    12/21/19 1526  TSH 2.535  FREET4 1.56*   Anemia Panel: No results for input(s): VITAMINB12, FOLATE, FERRITIN, TIBC, IRON, RETICCTPCT in the last 72 hours.  --------------------------------------------------------------------------------------------------------------- Urine analysis:    Component Value Date/Time   COLORURINE YELLOW 12/17/2019 2250   APPEARANCEUR CLEAR 12/17/2019 2250   LABSPEC 1.019 12/17/2019 2250   PHURINE 5.0 12/17/2019 2250   GLUCOSEU 50 (A) 12/17/2019 2250   HGBUR NEGATIVE 12/17/2019 2250   BILIRUBINUR NEGATIVE 12/17/2019 2250   KETONESUR NEGATIVE 12/17/2019 2250   PROTEINUR NEGATIVE 12/17/2019 2250   NITRITE NEGATIVE 12/17/2019 2250   LEUKOCYTESUR NEGATIVE 12/17/2019 2250      Imaging Results:    DG Chest Port 1 View  Result Date: 12/21/2019 CLINICAL DATA:  Tachypnea. EXAM: PORTABLE CHEST 1 VIEW COMPARISON:  December 18, 2019. FINDINGS: The heart size and mediastinal contours are within normal limits. Both lungs are clear. No pneumothorax or pleural effusion is noted. The visualized skeletal structures are unremarkable. IMPRESSION: No active disease. Electronically Signed   By: Marijo Conception  M.D.   On: 12/21/2019 12:30   ekg st at 125, nl axis, no st-t changes c/w ischemia   Assessment & Plan:    Principal Problem:   Bipolar 1 disorder, depressed (HCC) Active Problems:   Tachycardia  Tachycardia Check trop I Check D dimer, if positive then VQ scan   Chest pain  Check trop I Check CXR Check cardiac echo   Hypertension Cont metoprolol 100mg  po bid Cont Amlodipine 5mg  po qday  Bipolar disorder  Cont Depakote Cont Zyprexa  H/o renal transplant Check cyclosporine level Check mycophenolate level Cont cyclosporine, cellcept  Non AG acidosis ? Sodium bicarbonate 1 amp iv x1 Nephrology consult requested (spoke with Johnney Ou)  Hypothyroidism Check TSH Cont Levothyroxine     DVT Prophylaxis-    Heparin - SCDs   AM Labs Ordered, also please review Full Orders  Family Communication: Admission, patients condition and plan of care including tests being ordered have been discussed with the patient who indicate understanding and agree with the plan and Code Status.  Code Status:  FULL CODE per patient  Admission status: Observation: Based on patients clinical presentation and evaluation of above clinical data, I have made determination that patient meets Observation criteria at this time.  Time spent in minutes : 55   Jani Gravel M.D on 12/23/2019 at 1:20 AM

## 2019-12-23 NOTE — Progress Notes (Signed)
ANTICOAGULATION CONSULT NOTE - Initial Consult  Pharmacy Consult for IV heparin Indication:   Allergies  Allergen Reactions  . Atorvastatin Other (See Comments)    Myalgias  . Ketoconazole Rash  . Abilify [Aripiprazole] Other (See Comments)    Unknown  . Ibuprofen Other (See Comments)    Unknown  . Levaquin [Levofloxacin] Other (See Comments)    Unknown  . Sulfa Antibiotics Other (See Comments)    Unknown  . Sulfamethoxazole Other (See Comments)    Unknown    Patient Measurements: Height: 5\' 7"  (170.2 cm) Weight: 167 lb (75.8 kg) IBW/kg (Calculated) : 61.6 Heparin Dosing Weight: actual  Vital Signs: Temp: 98.2 F (36.8 C) (02/06 0311) Temp Source: Oral (02/06 0311) BP: 155/112 (02/06 0311) Pulse Rate: 116 (02/06 0311)  Labs: Recent Labs    12/20/19 0941 12/21/19 1526 12/22/19 2210 12/23/19 0153  HGB 11.9*  --   --   --   HCT 34.9*  --   --   --   PLT 242  --   --   --   CREATININE 1.44* 1.43* 1.36*  --   TROPONINIHS  --   --   --  6    Estimated Creatinine Clearance: 42.6 mL/min (A) (by C-G formula based on SCr of 1.36 mg/dL (H)).   Medical History: Past Medical History:  Diagnosis Date  . Anxiety 2019  . Bipolar 1 disorder ([place]) 2019  . Chronic kidney disease (CKD)    Stage 1  . Difficulty in walking, not elsewhere classified   . Dysphagia, oropharyngeal phase   . Enterocolitis due to Clostridioides difficile 2019  . Hypothyroidism 2019  . Kidney transplant status   . Muscle weakness   . Urinary tract infection     Medications:  Scheduled:  . amLODipine  5 mg Oral Once  . cycloSPORINE  125 mg Oral q morning - 10a  . cycloSPORINE  150 mg Oral QHS  . divalproex  500 mg Oral QHS  . doxazosin  1 mg Oral QHS  . levothyroxine  50 mcg Oral Q0600  . magnesium oxide  400 mg Oral Daily  . metoprolol tartrate  100 mg Oral BID  . mycophenolate  250 mg Oral BID  . OLANZapine  5 mg Oral QHS  . sodium bicarbonate  50 mEq Intravenous Once  . thiamine   100 mg Oral Daily   Or  . thiamine  100 mg Intravenous Daily   Infusions:  . sodium chloride    . sodium chloride 75 mL/hr at 12/23/19 0252    Assessment: 24 yoF in psych ED since 1/31 now with elevated d-dimer, VQ pending. IV heparin for VTE treatment.   Baseline labs: aptt=26, INR=1.1, H/H = 12/37.5, plts = 293   Goal of Therapy:  Heparin level 0.3-0.7 units/ml Monitor platelets by anticoagulation protocol: Yes   Plan:  Baseline aptt and INR STAT Heparin 3000 unit IV bolus x1 Start drip at 1200 units/hr Daily CBC/HL Check 1st HL in 6 hours  Dorrene German 12/23/2019,3:19 AM

## 2019-12-23 NOTE — Progress Notes (Signed)
xcover Per RN pt voiced suicidal ideation,  But pt states yes to pretty much every question, its difficult to discern what she is intending.   1:1 direct obs for safety

## 2019-12-23 NOTE — Progress Notes (Signed)
Patient's son, Achille Rich, called requesting update on patient. Son is concerned d/t no previous updates from MD or SW. Son states pt's psych status has not changed in the past 4 days, and he has "never seen her this bad before." Son states pt is normally A&O at baseline but does go in to a manic state every few years when she gets a bad infection. Psych to consult on patient- iPad requested from [facility]. Psych MD made aware pt's son would like an update from her.

## 2019-12-23 NOTE — Consult Note (Signed)
[place]  Reason for Consultation: s/p renal transplant, acidosis Requesting Provider: Dr. Maudie Mercury  HPI: Brenda Nolan is an 69 y.o. 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 Joesph July), bipolar 1, h/o C diff, h/o UTIs, HTN who is seen for eval and management of acidosis and medication management s/p renal transplant.   It appears she presented 12/17/19 to ED with AMS and was awaiting bed placement at SNF but yesterday became tachycardic and c/o CP prompting admission.  + D dimer, ok, VQ pending.  Tm 99.3.   She's been having hallucinations and SI and is being eval by psych now and has sitter.  Son says this occurs when has infection, but this time is particularly bad.    PMH: Past Medical History:  Diagnosis Date  . Anxiety 2019  . Bipolar 1 disorder ([place]) 2019  . Chronic kidney disease (CKD)    Stage 1  . Difficulty in walking, not elsewhere classified   . Dysphagia, oropharyngeal phase   . Enterocolitis due to Clostridioides difficile 2019  . Hypothyroidism 2019  . Kidney transplant status   . Muscle weakness   . Urinary tract infection    PSH: History reviewed. No pertinent surgical history.   Past Medical History:  Diagnosis Date  . Anxiety 2019  . Bipolar 1 disorder ([place]) 2019  . Chronic kidney disease (CKD)    Stage 1  . Difficulty in walking, not elsewhere classified   . Dysphagia, oropharyngeal phase   . Enterocolitis due to Clostridioides difficile 2019  . Hypothyroidism 2019  . Kidney transplant status   . Muscle weakness   . Urinary tract infection     Medications:  I have reviewed the patient's current medications.  Medications Prior to Admission  Medication Sig Dispense Refill  . amLODipine (NORVASC) 5 MG tablet Take 5 mg by mouth daily.    . clonazePAM (KLONOPIN) 1 MG tablet Take 1 tablet (1 mg total) by mouth daily as needed for up to 10 doses (For  nervousness). 10 tablet 0  . cycloSPORINE (SANDIMMUNE) 25 MG capsule Take 125 mg by mouth daily.    . cycloSPORINE (SANDIMMUNE) 25 MG capsule Take 150 mg by mouth at bedtime.    . divalproex (DEPAKOTE ER) 500 MG 24 hr tablet Take 500 mg by mouth at bedtime.    Marland Kitchen doxazosin (CARDURA) 1 MG tablet Take 1 mg by mouth at bedtime.    . fenofibrate 160 MG tablet Take 160 mg by mouth daily.    Marland Kitchen levothyroxine (SYNTHROID) 50 MCG tablet Take 50 mcg by mouth daily before breakfast.    . LORazepam (ATIVAN) 1 MG tablet Take 1 tablet (1 mg total) by mouth 3 (three) times daily as needed for anxiety. 30 tablet 0  . Magnesium 400 MG TABS Take 1 tablet by mouth daily.    . megestrol (MEGACE) 40 MG/ML suspension Take 20 mg by mouth daily.    . metoprolol tartrate (LOPRESSOR) 100 MG tablet Take 100 mg by mouth 2 (two) times daily.    . Multiple Vitamin (MULTIVITAMIN) tablet Take 1 tablet by mouth daily.    . mycophenolate (CELLCEPT) 250 MG capsule Take 250 mg by mouth 2 (two) times daily.    Marland Kitchen OLANZapine (ZYPREXA) 2.5 MG tablet Take 1 tablet (2.5 mg total) by mouth daily. 30 tablet 0  . OLANZapine (ZYPREXA) 5 MG tablet Take 1 tablet (5 mg total) by mouth at  bedtime. 30 tablet 0    ALLERGIES:   Allergies  Allergen Reactions  . Atorvastatin Other (See Comments)    Myalgias  . Ketoconazole Rash  . Abilify [Aripiprazole] Other (See Comments)    Unknown  . Ibuprofen Other (See Comments)    Unknown  . Levaquin [Levofloxacin] Other (See Comments)    Unknown  . Sulfa Antibiotics Other (See Comments)    Unknown  . Sulfamethoxazole Other (See Comments)    Unknown    FAM HX: History reviewed. No pertinent family history.  Social History:   reports that she has never smoked. She has never used smokeless tobacco. She reports previous alcohol use. She reports that she does not use drugs.  ROS: 12 system ROS per HPI above.  Blood pressure (!) 153/99, pulse (!) 128, temperature 97.6 F (36.4 C), temperature  source Oral, resp. rate (!) 24, height 5\' 7"  (1.702 m), weight 75.8 kg, SpO2 99 %. PHYSICAL EXAM: Gen: chronically ill anxious appearing woman in bed  Eyes:  Anicteric, EOMI ENT: MMM Neck: supple CV:  Tachycardic, regular Abd:  Soft, RLQ transplant no palpable Back: clear GU: no foley Extr:  No edema Neuro: dystonic appearing oral movements, shaking, not oriented Psych:  Says she died, answers yes to every question.   Results for orders placed or performed during the hospital encounter of 12/17/19 (from the past 48 hour(s))  Basic metabolic panel     Status: Abnormal   Collection Time: 12/21/19  3:26 PM  Result Value Ref Range   Sodium 141 135 - 145 mmol/L   Potassium 4.5 3.5 - 5.1 mmol/L    Comment: DELTA CHECK NOTED NO VISIBLE HEMOLYSIS    Chloride 112 (H) 98 - 111 mmol/L   CO2 20 (L) 22 - 32 mmol/L   Glucose, Bld 122 (H) 70 - 99 mg/dL   BUN 29 (H) 8 - 23 mg/dL   Creatinine, Ser 1.43 (H) 0.44 - 1.00 mg/dL   Calcium 9.4 8.9 - 10.3 mg/dL   GFR calc non Af Amer 38 (L) >60 mL/min   GFR calc Af Amer 44 (L) >60 mL/min   Anion gap 9 5 - 15    Comment: Performed at [facility], [place] [address], [place], [place] [address]  Magnesium     Status: None   Collection Time: 12/21/19  3:26 PM  Result Value Ref Range   Magnesium 2.1 1.7 - 2.4 mg/dL    Comment: Performed at [facility], [place] [address], [place], [place] [address]  TSH     Status: None   Collection Time: 12/21/19  3:26 PM  Result Value Ref Range   TSH 2.535 0.350 - 4.500 uIU/mL    Comment: Performed by a 3rd Generation assay with a functional sensitivity of <=0.01 uIU/mL. Performed at [facility], [place] [address] [place], [place] [address]   T4, free     Status: Abnormal   Collection Time: 12/21/19  3:26 PM  Result Value Ref Range   Free T4 1.56 (H) 0.61 - 1.12 ng/dL    Comment: (NOTE) Biotin ingestion may interfere with free T4 tests. If the results  are inconsistent with the TSH level, previous test results, or the clinical presentation, then consider biotin interference. If needed, order repeat testing after stopping biotin. Performed at [facility], [place] [address], [place], [place] [address]   Lactic acid, plasma     Status: None   Collection Time: 12/21/19  3:26  PM  Result Value Ref Range   Lactic Acid, Venous 1.3 0.5 - 1.9 mmol/L    Comment: Performed at [facility], [place] [address] [place], [place] [address]  Valproic acid level     Status: Abnormal   Collection Time: 12/22/19 10:10 PM  Result Value Ref Range   Valproic Acid Lvl 19 (L) 50.0 - 100.0 ug/mL    Comment: Performed at [facility], [place] [address] [place], [place] [address]  Ammonia     Status: None   Collection Time: 12/22/19 10:10 PM  Result Value Ref Range   Ammonia 28 9 - 35 umol/L    Comment: Performed at [facility], [place] [address], [place], [place] [address]  Blood gas, venous (at [facility] and [facility], not at [facility])     Status: Abnormal   Collection Time: 12/22/19 10:10 PM  Result Value Ref Range   pH, Ven 7.619 (HH) 7.250 - 7.430    Comment: CRITICAL RESULT CALLED TO, READ BACK BY AND VERIFIED WITH: B.BROOKS AT 2251 ON 12/22/19 BY N.THOMPSON    pCO2, Ven BELOW REPORTABLE RANGE 44.0 - 60.0 mmHg   pO2, Ven 174.0 (H) 32.0 - 45.0 mmHg   O2 Saturation 99.5 %   Patient temperature 98.6     Comment: Performed at [facility], [place] [address] [place], [place] [id number]  Basic metabolic panel     Status: Abnormal   Collection Time: 12/22/19 10:10 PM  Result Value Ref Range   Sodium 140 135 - 145 mmol/L   Potassium 4.6 3.5 - 5.1 mmol/L   Chloride 114 (H) 98 - 111 mmol/L   CO2 13 (L) 22 - 32 mmol/L   Glucose, Bld 115 (H) 70 - 99 mg/dL   BUN 28 (H) 8 - 23 mg/dL   Creatinine, Ser 1.36 (H) 0.44 - 1.00 mg/dL   Calcium 9.6 8.9 - 10.3 mg/dL   GFR calc non Af Amer 40 (L) >60 mL/min   GFR  calc Af Amer 47 (L) >60 mL/min   Anion gap 13 5 - 15    Comment: Performed at [facility], [place] [address] [place], [place] [id number]  Salicylate level     Status: Abnormal   Collection Time: 12/22/19 11:59 PM  Result Value Ref Range   Salicylate Lvl [id number] (L) 7.0 - 30.0 mg/dL    Comment: Performed at [facility], [place] [address], [place], [place] [address]  Blood gas, arterial (at [facility] & [facility])     Status: Abnormal   Collection Time: 12/23/19 12:29 AM  Result Value Ref Range   FIO2 21.00    pH, Arterial 7.475 (H) 7.350 - 7.450   pCO2 arterial 21.7 (L) 32.0 - 48.0 mmHg   pO2, Arterial 54.4 (L) 83.0 - 108.0 mmHg   Bicarbonate 15.8 (L) 20.0 - 28.0 mmol/L   Acid-base deficit 5.8 (H) 0.0 - 2.0 mmol/L   O2 Saturation 88.8 %   Patient temperature 98.6    Collection site RIGHT RADIAL    Drawn by [id number]    Sample type ARTERIAL    Allens test (pass/fail) PASS PASS    Comment: Performed at [facility], [place] [address] [place], [place] [address]  CBG monitoring, ED     Status: Abnormal   Collection Time: 12/23/19 12:41 AM  Result Value Ref Range   Glucose-Capillary 141 (H) 70 - 99 mg/dL  Troponin I (High Sensitivity)  Status: None   Collection Time: 12/23/19  1:53 AM  Result Value Ref Range   Troponin I (High Sensitivity) 6 <18 ng/L    Comment: (NOTE) Elevated high sensitivity troponin I (hsTnI) values and significant  changes across serial measurements may suggest ACS but many other  chronic and acute conditions are known to elevate hsTnI results.  Refer to the "Links" section for chest pain algorithms and additional  guidance. Performed at [facility], [place] [address] [place], [place] [address]   D-dimer, quantitative (not at [facility])     Status: Abnormal   Collection Time: 12/23/19  1:53 AM  Result Value Ref Range   D-Dimer, Quant 4.76 (H) 0.00 - 0.50 ug/mL-FEU    Comment: (NOTE) At the manufacturer cut-off  of 0.50 ug/mL FEU, this assay has been documented to exclude PE with a sensitivity and negative predictive value of 97 to 99%.  At this time, this assay has not been approved by the FDA to exclude DVT/VTE. Results should be correlated with clinical presentation. Performed at [facility], [place] [address], [place], [place] [address]   Comprehensive metabolic panel     Status: Abnormal   Collection Time: 12/23/19  3:07 AM  Result Value Ref Range   Sodium 136 135 - 145 mmol/L   Potassium 3.9 3.5 - 5.1 mmol/L   Chloride 110 98 - 111 mmol/L   CO2 17 (L) 22 - 32 mmol/L   Glucose, Bld 125 (H) 70 - 99 mg/dL   BUN 27 (H) 8 - 23 mg/dL   Creatinine, Ser 1.24 (H) 0.44 - 1.00 mg/dL   Calcium 9.6 8.9 - 10.3 mg/dL   Total Protein 6.8 6.5 - 8.1 g/dL   Albumin 3.5 3.5 - 5.0 g/dL   AST 15 15 - 41 U/L   ALT 7 0 - 44 U/L   Alkaline Phosphatase 36 (L) 38 - 126 U/L   Total Bilirubin 1.0 0.3 - 1.2 mg/dL   GFR calc non Af Amer 45 (L) >60 mL/min   GFR calc Af Amer 52 (L) >60 mL/min   Anion gap 9 5 - 15    Comment: Performed at [facility], [place] [address], [place], [place] [address]  CBC     Status: None   Collection Time: 12/23/19  3:07 AM  Result Value Ref Range   WBC 8.7 4.0 - 10.5 K/uL   RBC 4.01 3.87 - 5.11 MIL/uL   Hemoglobin 12.0 12.0 - 15.0 g/dL   HCT 37.5 36.0 - 46.0 %   MCV 93.5 80.0 - 100.0 fL   MCH 29.9 26.0 - 34.0 pg   MCHC 32.0 30.0 - 36.0 g/dL   RDW 12.2 11.5 - 15.5 %   Platelets 293 150 - 400 K/uL   nRBC 0.0 0.0 - 0.2 %    Comment: Performed at [facility], [place] [address], [place],  [address]  TSH     Status: None   Collection Time: 12/23/19  3:07 AM  Result Value Ref Range   TSH 2.853 0.350 - 4.500 uIU/mL    Comment: Performed by a 3rd Generation assay with a functional sensitivity of <=0.01 uIU/mL. Performed at [facility], [place] [address], [place],  [address]   APTT     Status: None    Collection Time: 12/23/19  4:16 AM  Result Value Ref Range   aPTT 26 24 - 36 seconds    Comment: Performed at [facility], [place] [address] [place], [place] [address]  Protime-INR     Status: None   Collection Time: 12/23/19  4:16 AM  Result Value Ref Range   Prothrombin Time 13.9 11.4 - 15.2 seconds   INR 1.1 0.8 - 1.2    Comment: (NOTE) INR goal varies based on device and disease states. Performed at [facility], [place] [address], [place], [place] [address]   Troponin I (High Sensitivity)     Status: None   Collection Time: 12/23/19  5:56 AM  Result Value Ref Range   Troponin I (High Sensitivity) 6 <18 ng/L    Comment: (NOTE) Elevated high sensitivity troponin I (hsTnI) values and significant  changes across serial measurements may suggest ACS but many other  chronic and acute conditions are known to elevate hsTnI results.  Refer to the "Links" section for chest pain algorithms and additional  guidance. Performed at [facility], [place] [address], [place], [place] [address]   Heparin level (unfractionated)     Status: Abnormal   Collection Time: 12/23/19 12:01 PM  Result Value Ref Range   Heparin Unfractionated 0.26 (L) 0.30 - 0.70 IU/mL    Comment: (NOTE) If heparin results are below expected values, and patient dosage has  been confirmed, suggest follow up testing of antithrombin III levels. Performed at [facility], [place] [address] [place], [place] [address]   CBC     Status: Abnormal   Collection Time: 12/23/19 12:01 PM  Result Value Ref Range   WBC 8.1 4.0 - 10.5 K/uL   RBC 3.72 (L) 3.87 - 5.11 MIL/uL   Hemoglobin 11.4 (L) 12.0 - 15.0 g/dL   HCT 34.2 (L) 36.0 - 46.0 %   MCV 91.9 80.0 - 100.0 fL   MCH 30.6 26.0 - 34.0 pg   MCHC 33.3 30.0 - 36.0 g/dL   RDW 12.3 11.5 - 15.5 %   Platelets 291 150 - 400 K/uL   nRBC 0.0 0.0 - 0.2 %    Comment: Performed at [facility],  [place] [address], [place], [place] [address]    DG CHEST PORT 1 VIEW  Result Date: 12/23/2019 CLINICAL DATA:  Chest pain beginning today.  Pain EXAM: PORTABLE CHEST 1 VIEW COMPARISON:  12/21/2019.  12/18/2019.  01/31/2018. FINDINGS: Slightly progressive linear atelectasis at both lung bases. Upper lungs remain clear. No lobar collapse. No effusions. Heart size remains normal. Vascularity is normal. Minimal aortic atherosclerotic calcification. IMPRESSION: Slight further worsening of linear atelectasis in both lower lobes. Electronically Signed   By: Nelson Chimes M.D.   On: 12/23/2019 01:45    Assessment/Plan **s/p renal transplant: Baseline Cr 1-1.4.  CysA trough goal 150-200 per [facility] chart.  Level pending but it was drawn at 3am from 10pm administration so will not use that.  Check tomorrow AM 10am for true trough.  Cont current meds.  **bipolar DO: having SI; psych consulting  **NAGMA: suspect secondary to CNI. Supplemental bicarb to level 22.    **h/o recurrent UTI:  Check UA and culture today.  **Tachycardia and chest pain: per primary. Trops, TTE. CXR ok. VQ pending.  Hopefully will not need CTA --> if so would need proper hydration prior to prevent contrast nephropathy.   **HTN: ok on home meds.   Justin Mend 12/23/2019, 1:04 PM

## 2019-12-23 NOTE — Progress Notes (Signed)
Called report to [facility] who will resume care. Made Dr Maudie Mercury aware of of the pt's [place].

## 2019-12-23 NOTE — ED Notes (Signed)
While getting ready to transfer the pt off the unit. She states that she wants to harm herself, with no plan. Page MD Maudie Mercury order placed for one to one sitter for now. Tommie Raymond RN will now resume care.

## 2019-12-23 NOTE — Progress Notes (Signed)
ANTICOAGULATION CONSULT NOTE - Follow Up Consult  Pharmacy Consult for heparin Indication: r/o pulmonary embolus  Allergies  Allergen Reactions  . Atorvastatin Other (See Comments)    Myalgias  . Ketoconazole Rash  . Abilify [Aripiprazole] Other (See Comments)    Unknown  . Ibuprofen Other (See Comments)    Unknown  . Levaquin [Levofloxacin] Other (See Comments)    Unknown  . Sulfa Antibiotics Other (See Comments)    Unknown  . Sulfamethoxazole Other (See Comments)    Unknown    Patient Measurements: Height: 5\' 7"  (170.2 cm) Weight: 167 lb (75.8 kg) IBW/kg (Calculated) : 61.6 Heparin Dosing Weight: 76 kg  Vital Signs: Temp: 99.3 F (37.4 C) (02/06 0515) Temp Source: Oral (02/06 0515) BP: 129/90 (02/06 0515) Pulse Rate: 110 (02/06 0515)  Labs: Recent Labs    12/21/19 1526 12/22/19 2210 12/23/19 0153 12/23/19 0307 12/23/19 0416 12/23/19 0556  HGB  --   --   --  12.0  --   --   HCT  --   --   --  37.5  --   --   PLT  --   --   --  293  --   --   APTT  --   --   --   --  26  --   LABPROT  --   --   --   --  13.9  --   INR  --   --   --   --  1.1  --   CREATININE 1.43* 1.36*  --  1.24*  --   --   TROPONINIHS  --   --  6  --   --  6    Estimated Creatinine Clearance: 46.8 mL/min (A) (by C-G formula based on SCr of 1.24 mg/dL (H)).   Assessment: Patient's a 69 y.o F with hx bipolar and kidney transplant who was holding in the ED when he developed CP.  D-Dimer was found to be elevated. Heparin drip started on 2/6 for r/o PE.  Today, 12/23/2019: - first heparin level is sub-therapeutic at 0.26 - cbc ok - no bleeding documented   Goal of Therapy:  Heparin level 0.3-0.7 units/ml Monitor platelets by anticoagulation protocol: Yes   Plan:  - heparin drip to 1300 units/hr - check 6 hr heparin level - monitor for s/s bleeding - V/Q scan ordered for 2/6 --> f/u results   Lida Berkery P 12/23/2019,10:07 AM

## 2019-12-23 NOTE — Progress Notes (Signed)
VQ Scan ordered for patient. Per nephrology, attempt VQ Scan first before CT Angio d/t history of renal transplant. Nuclear medicine made aware- ordering medication dose now. Will plan to transport patient around 1600.

## 2019-12-23 NOTE — Progress Notes (Signed)
Pt now saying she hears voices, specifically "Ludwig Clarks" talking to her. Heard yelling, "No! I don't want to commit suicide!" When asked if pt has a plan to commit suicide pt did not answer. When asked if she had attempted before pt states, "Yes." When asked how pt stated "I cut myself with scissors." Pt states she would try this again. When asked if pt thinks about suicide often pt states "no." Pt knew she was at [facility] but did not know the year.

## 2019-12-23 NOTE — Progress Notes (Addendum)
Pt pended to 4th floor from TCU. Per previous RN's note, RN states that pt is suicidal with no active plan to harm herself. No orders placed for suicide sitter at bedside. This RN addressed concerns with admitting MD Maudie Mercury and hospital [facility]. Per MD no need for suicide sitter at this time. Pt provided with Air cabin crew. Safety precautions implemented. Primary nurse, Tommie Raymond made aware of situation. Will continue with plan of care for pt.

## 2019-12-23 NOTE — Progress Notes (Signed)
PROGRESS NOTE  Brenda Nolan DOB: 1951/07/24 DOA: 12/17/2019 PCP: Patient, No Pcp Per  HPI/Recap of past 24 hours: Brenda Nolan  is a 69 y.o. female, anxiety, bipolar 1 disorders, stage IIIb chronic kidney disease, unspecified difficulty walking history, oropharyngeal phase dysphagia, history of C. difficile colitis, w recent admission 11/29/19 for AMS secondary to UTI, in psych ED since 1/31.  Was admitted with altered mental status and psychotic features with history of bipolar possibly decompensation.  Psychiatry consulted and was waiting for general psychiatric hospitalization.  The psych has signed off from case on December 22, 2019.  Patient was waiting to be transferred back to [place], per social worker they would take her on if there is no medical issues or if she no longer has one-on-one sitter for 24 hours.  But patient is now with elevated d-dimer, VQ pending. IV heparin for VTE treatment.  Also has metabolic acidosis and continues to voice suicidal ideation and has one-on-one sitter for safety.  December 23, 2019 Subjective: Patient seen and examined at bedside.  She is tremulous.  She is complaining stating that she committed suicide.  When I told her she still alive she said again she committed suicide and that she is bipolar when asked her why she committed suicide she says she has bipolar.  Assessment/Plan: Principal Problem:   Bipolar 1 disorder, depressed ([place]) Active Problems:   Tachycardia  68 yoF in psych ED since 1/31.  Was admitted with altered mental status and psychotic features with history of bipolar possibly decompensation.  Psychiatry consulted and was waiting for general psychiatric hospitalization.  The psych has signed off from case on December 22, 2019.  Patient was waiting to be transferred back to [place], per social worker they would take her on if there is no medical issues or if she no longer has one-on-one sitter for 24 hours.  But  patient is now with elevated d-dimer, VQ pending. IV heparin for VTE treatment.  Also has metabolic acidosis and continues to voice suicidal ideation and has one-on-one sitter for safety.  1.  Tachycardia.  Patient's most likely anxious from her bipolar disorder  2.  Positive D-dimer.  Patient was originally scheduled to have VQ scan but due to agitation on her bipolar and psychotic to symptoms she may not sit still for the VQ scan.  They recommended doing CT angiogram.  I will order CT angiogram.  Patient is currently on heparin IV.  Due to her history of kidney transplant with one functioning kidney and creatinine of 1.24 we were unable to do the angiogram so we will try to do the VQ scan.  VQ scan was finally done and it showed no evidence of embolus.  So I will discontinue the heparin drip.  And change it to subacute heparin for DVT prophylaxis  3.  Bipolar disorder with psychotic features patient is supposedly suicidal.  But this might just be a manifestation of psychosis because she thinks she is already committed suicide.  She was seen by psychiatry who signed off yesterday.  She is waiting for transfer back to [place] home but they would not take her as long as she has a sitter at bedside   Code Status: Full  Severity of Illness: The appropriate patient status for this patient is INPATIENT. Inpatient status is judged to be reasonable and necessary in order to provide the required intensity of service to ensure the patient's safety. The patient's presenting symptoms, physical exam  findings, and initial radiographic and laboratory data in the context of their chronic comorbidities is felt to place them at high risk for further clinical deterioration. Furthermore, it is not anticipated that the patient will be medically stable for discharge from the hospital within 2 midnights of admission. The following factors support the patient status of inpatient.   " Patient needs inpatient  due to worsening of medical condition with metabolic acidosis patient needs VQ scan scan she is on IV heparin  * I certify that at the point of admission it is my clinical judgment that the patient will require inpatient hospital care spanning beyond 2 midnights from the point of admission due to high intensity of service, high risk for further deterioration and high frequency of surveillance required.*    Family Communication: Talk to son  Disposition Plan: Back to SNF.  Barrier to discharge: Patient is still voicing suicidality she is still psychotic she is on [employer] data [employer].  She also has metabolic acidosis with ongoing medical treatment   Consultants:  Psych  Procedures:  VQ scan  Antimicrobials:  Ceftriaxone  DVT prophylaxis: Heparin   Objective: Vitals:   12/23/19 0311 12/23/19 0515 12/23/19 0900 12/23/19 1009  BP: (!) 155/112 129/90 (!) 156/104 (!) 153/99  Pulse: (!) 116 (!) 110 (!) 128   Resp: 20 (!) 24    Temp: 98.2 F (36.8 C) 99.3 F (37.4 C) 97.6 F (36.4 C)   TempSrc: Oral Oral Oral   SpO2: 99% 99% 99%   Weight: 75.8 kg     Height: 5\' 7"  (1.702 m)       Intake/Output Summary (Last 24 hours) at 12/23/2019 1234 Last data filed at 12/23/2019 1212 Gross per 24 hour  Intake 720 ml  Output 2500 ml  Net -1780 ml   Filed Weights   12/23/19 0311  Weight: 75.8 kg   Body mass index is 26.16 kg/m.  Exam:  . General: 69 y.o. year-old female well developed well nourished in no acute distress.  Alert and oriented x3. . Cardiovascular: Regular rate and rhythm with no rubs or gallops.  No thyromegaly or JVD noted.   Marland Kitchen Respiratory: Clear to auscultation with no wheezes or rales. Good inspiratory effort. . Abdomen: Soft nontender nondistended with normal bowel sounds x4 quadrants. . Musculoskeletal: No lower extremity edema. 2/4 pulses in all 4 extremities. . Skin: No ulcerative lesions noted or rashes, . Psychiatry: Mood is appropriate for condition and  setting    Data Reviewed: CBC: Recent Labs  Lab 12/16/19 1358 12/17/19 1505 12/20/19 0941 12/23/19 0307 12/23/19 1201  WBC 12.2* 13.4* 11.1* 8.7 8.1  NEUTROABS 9.6* 10.1* 8.5*  --   --   HGB 13.2 13.0 11.9* 12.0 11.4*  HCT 42.9 39.5 34.9* 37.5 34.2*  MCV 98.6 94.5 91.1 93.5 91.9  PLT 323 295 242 293 [id number]   Basic Metabolic Panel: Recent Labs  Lab 12/17/19 1505 12/20/19 0941 12/21/19 1526 12/22/19 2210 12/23/19 0307  NA 140 139 141 140 136  K 4.4 3.7 4.5 4.6 3.9  CL 113* 112* 112* 114* 110  CO2 18* 16* 20* 13* 17*  GLUCOSE 93 141* 122* 115* 125*  BUN 33* 34* 29* 28* 27*  CREATININE 1.35* 1.44* 1.43* 1.36* 1.24*  CALCIUM 9.5 9.5 9.4 9.6 9.6  MG  --   --  2.1  --   --    GFR: Estimated Creatinine Clearance: 46.8 mL/min (A) (by C-G formula based on SCr of 1.24 mg/dL (H)). Liver Function Tests:  Recent Labs  Lab 12/17/19 1505 12/20/19 0941 12/23/19 0307  AST 15 19 15   ALT 8 7 7   ALKPHOS 29* 30* 36*  BILITOT 0.6 0.4 1.0  PROT 7.1 6.6 6.8  ALBUMIN 3.6 3.3* 3.5   No results for input(s): LIPASE, AMYLASE in the last 168 hours. Recent Labs  Lab 12/22/19 2210  AMMONIA 28   Coagulation Profile: Recent Labs  Lab 12/23/19 0416  INR 1.1   Cardiac Enzymes: No results for input(s): CKTOTAL, CKMB, CKMBINDEX, TROPONINI in the last 168 hours. BNP (last 3 results) No results for input(s): PROBNP in the last 8760 hours. HbA1C: No results for input(s): HGBA1C in the last 72 hours. CBG: Recent Labs  Lab 12/23/19 0041  GLUCAP 141*   Lipid Profile: No results for input(s): CHOL, HDL, LDLCALC, TRIG, CHOLHDL, LDLDIRECT in the last 72 hours. Thyroid Function Tests: Recent Labs    12/21/19 1526 12/21/19 1526 12/23/19 0307  TSH 2.535   < > 2.853  FREET4 1.56*  --   --    < > = values in this interval not displayed.   Anemia Panel: No results for input(s): VITAMINB12, FOLATE, FERRITIN, TIBC, IRON, RETICCTPCT in the last 72 hours. Urine analysis:    Component  Value Date/Time   COLORURINE YELLOW 12/17/2019 2250   APPEARANCEUR CLEAR 12/17/2019 2250   LABSPEC 1.019 12/17/2019 2250   PHURINE 5.0 12/17/2019 2250   GLUCOSEU 50 (A) 12/17/2019 2250   HGBUR NEGATIVE 12/17/2019 2250   BILIRUBINUR NEGATIVE 12/17/2019 2250   KETONESUR NEGATIVE 12/17/2019 2250   PROTEINUR NEGATIVE 12/17/2019 2250   NITRITE NEGATIVE 12/17/2019 2250   LEUKOCYTESUR NEGATIVE 12/17/2019 2250   Sepsis Labs: @LABRCNTIP (procalcitonin:4,lacticidven:4)  ) Recent Results (from the past 240 hour(s))  Respiratory Panel by RT PCR (Flu A&B, Covid) - Nasopharyngeal Swab     Status: None   Collection Time: 12/17/19  2:37 PM   Specimen: Nasopharyngeal Swab  Result Value Ref Range Status   SARS Coronavirus 2 by RT PCR NEGATIVE NEGATIVE Final    Comment: (NOTE) SARS-CoV-2 target nucleic acids are NOT DETECTED. The SARS-CoV-2 RNA is generally detectable in upper respiratoy specimens during the acute phase of infection. The lowest concentration of SARS-CoV-2 viral copies this assay can detect is 131 copies/mL. A negative result does not preclude SARS-Cov-2 infection and should not be used as the sole basis for treatment or other patient management decisions. A negative result may occur with  improper specimen collection/handling, submission of specimen other than nasopharyngeal swab, presence of viral mutation(s) within the areas targeted by this assay, and inadequate number of viral copies (<131 copies/mL). A negative result must be combined with clinical observations, patient history, and epidemiological information. The expected result is Negative. Fact Sheet for Patients:  PinkCheek.be Fact Sheet for Healthcare Providers:  GravelBags.it This test is not yet ap proved or cleared by the [place] FDA and  has been authorized for detection and/or diagnosis of SARS-CoV-2 by FDA under an Emergency Use Authorization  (EUA). This EUA will remain  in effect (meaning this test can be used) for the duration of the COVID-19 declaration under Section 564(b)(1) of the Act, 21 U.S.C. section 360bbb-3(b)(1), unless the authorization is terminated or revoked sooner.    Influenza A by PCR NEGATIVE NEGATIVE Final   Influenza B by PCR NEGATIVE NEGATIVE Final    Comment: (NOTE) The Xpert Xpress SARS-CoV-2/FLU/RSV assay is intended as an aid in  the diagnosis of influenza from Nasopharyngeal swab specimens and  should not be  used as a sole basis for treatment. Nasal washings and  aspirates are unacceptable for Xpert Xpress SARS-CoV-2/FLU/RSV  testing. Fact Sheet for Patients: PinkCheek.be Fact Sheet for Healthcare Providers: GravelBags.it This test is not yet approved or cleared by the [place] FDA and  has been authorized for detection and/or diagnosis of SARS-CoV-2 by  FDA under an Emergency Use Authorization (EUA). This EUA will remain  in effect (meaning this test can be used) for the duration of the  Covid-19 declaration under Section 564(b)(1) of the Act, 21  U.S.C. section 360bbb-3(b)(1), unless the authorization is  terminated or revoked. Performed at [facility], [place] [address], [place], [place] [address]   Urine culture     Status: None   Collection Time: 12/17/19 10:32 PM   Specimen: Urine, Random  Result Value Ref Range Status   Specimen Description   Final    URINE, RANDOM Performed at [place] [address], [place], [place] [address]    Special Requests   Final    NONE Performed at [facility], [place] [address], [place], [place] [address]    Culture   Final    NO GROWTH Performed at [facility], [place] [address] [place], [place] [address]    Report Status 12/18/2019 FINAL  Final  Respiratory Panel by RT PCR (Flu A&B, Covid) - Nasopharyngeal Swab      Status: None   Collection Time: 12/19/19 10:14 PM   Specimen: Nasopharyngeal Swab  Result Value Ref Range Status   SARS Coronavirus 2 by RT PCR NEGATIVE NEGATIVE Final    Comment: (NOTE) SARS-CoV-2 target nucleic acids are NOT DETECTED. The SARS-CoV-2 RNA is generally detectable in upper respiratoy specimens during the acute phase of infection. The lowest concentration of SARS-CoV-2 viral copies this assay can detect is 131 copies/mL. A negative result does not preclude SARS-Cov-2 infection and should not be used as the sole basis for treatment or other patient management decisions. A negative result may occur with  improper specimen collection/handling, submission of specimen other than nasopharyngeal swab, presence of viral mutation(s) within the areas targeted by this assay, and inadequate number of viral copies (<131 copies/mL). A negative result must be combined with clinical observations, patient history, and epidemiological information. The expected result is Negative. Fact Sheet for Patients:  PinkCheek.be Fact Sheet for Healthcare Providers:  GravelBags.it This test is not yet ap proved or cleared by the [place] FDA and  has been authorized for detection and/or diagnosis of SARS-CoV-2 by FDA under an Emergency Use Authorization (EUA). This EUA will remain  in effect (meaning this test can be used) for the duration of the COVID-19 declaration under Section 564(b)(1) of the Act, 21 U.S.C. section 360bbb-3(b)(1), unless the authorization is terminated or revoked sooner.    Influenza A by PCR NEGATIVE NEGATIVE Final   Influenza B by PCR NEGATIVE NEGATIVE Final    Comment: (NOTE) The Xpert Xpress SARS-CoV-2/FLU/RSV assay is intended as an aid in  the diagnosis of influenza from Nasopharyngeal swab specimens and  should not be used as a sole basis for treatment. Nasal washings and  aspirates are unacceptable for  Xpert Xpress SARS-CoV-2/FLU/RSV  testing. Fact Sheet for Patients: PinkCheek.be Fact Sheet for Healthcare Providers: GravelBags.it This test is not yet approved or cleared by the [place] FDA and  has been authorized for detection and/or diagnosis of SARS-CoV-2 by  FDA under an Emergency Use Authorization (EUA). This EUA will remain  in effect (meaning this test can be used) for the duration of the  Covid-19 declaration under Section 564(b)(1) of the Act, 21  U.S.C. section 360bbb-3(b)(1), unless the authorization is  terminated or revoked. Performed at [facility], [place] [address], [place], [place] [address]       Studies: DG CHEST PORT 1 VIEW  Result Date: 12/23/2019 CLINICAL DATA:  Chest pain beginning today.  Pain EXAM: PORTABLE CHEST 1 VIEW COMPARISON:  12/21/2019.  12/18/2019.  01/31/2018. FINDINGS: Slightly progressive linear atelectasis at both lung bases. Upper lungs remain clear. No lobar collapse. No effusions. Heart size remains normal. Vascularity is normal. Minimal aortic atherosclerotic calcification. IMPRESSION: Slight further worsening of linear atelectasis in both lower lobes. Electronically Signed   By: Nelson Chimes M.D.   On: 12/23/2019 01:45    Scheduled Meds: . amLODipine  5 mg Oral Once  . cycloSPORINE  125 mg Oral q morning - 10a  . cycloSPORINE  150 mg Oral QHS  . divalproex  750 mg Oral QHS  . doxazosin  1 mg Oral QHS  . levothyroxine  50 mcg Oral Q0600  . LORazepam  0.5 mg Oral BID  . magnesium oxide  400 mg Oral Daily  . metoprolol tartrate  100 mg Oral BID  . mycophenolate  250 mg Oral BID  . OLANZapine  5 mg Oral QHS  . sodium bicarbonate  50 mEq Intravenous Once  . thiamine  100 mg Oral Daily   Or  . thiamine  100 mg Intravenous Daily    Continuous Infusions: . sodium chloride    . heparin 1,200 Units/hr (12/23/19 0515)     LOS: 0 days     Cristal Deer,  MD [facility]  To reach me or the doctor on call, go to: [email or web address] Password [facility]  12/23/2019, 12:34 PM

## 2019-12-23 NOTE — Progress Notes (Signed)
  Echocardiogram 2D Echocardiogram has been performed.  Brenda Nolan A [place] Hollar 12/23/2019, 3:07 PM

## 2019-12-23 NOTE — Progress Notes (Signed)
Patient appears much more calm since administration of PO Ativan. Pt no longer with tachypnea or labored breathing. HR 98. Pt's tremors have also mostly stopped. No longer verbalizing suicidal thoughts and is smiling at times. Pt was able to lie perfectly still during VQ scan and follow instructions per sitter. Continues to smack lips and roll tongue.

## 2019-12-23 NOTE — Progress Notes (Signed)
Blood gas obtained on room air. Sample sent to lab for analysis.

## 2019-12-24 MED ORDER — AMOXICILLIN 250 MG PO CAPS
500.0000 mg | ORAL_CAPSULE | Freq: Three times a day (TID) | ORAL | Status: AC
  Administered 2019-12-24 – 2020-01-03 (×32): 500 mg via ORAL
  Filled 2019-12-24 (×34): qty 2

## 2019-12-24 MED ORDER — CEPHALEXIN 250 MG PO CAPS
250.0000 mg | ORAL_CAPSULE | Freq: Four times a day (QID) | ORAL | Status: DC
  Administered 2019-12-24: 250 mg via ORAL
  Filled 2019-12-24 (×2): qty 1

## 2019-12-24 NOTE — Progress Notes (Signed)
[facility] Progress Note   Subjective:   Improved c/w yesterday - calm. Still with sitter. Per RN eating and drinking normally. Has purewick.  Objective Vitals:   12/23/19 2114 12/24/19 0500 12/24/19 0544 12/24/19 1311  BP: (!) 172/98  (!) 154/92 127/71  Pulse: (!) 127  (!) 101 95  Resp: 18  16 20   Temp: 99.6 F (37.6 C)  99.6 F (37.6 C) 99.6 F (37.6 C)  TempSrc: Oral  Oral Oral  SpO2: 98%  98% 98%  Weight:  78.3 kg    Height:       Physical Exam General: chronically ill appearing woman who is comfortable Heart: RRR, 90s Lungs: normal WOB Abdomen: soft, RLQ kidney not palpable or tender Extremities: no edema Neuro/psych: oriented to [facility] place, 2024  Additional Objective Labs: Basic Metabolic Panel: Recent Labs  Lab 12/21/19 1526 12/22/19 2210 12/23/19 0307  NA 141 140 136  K 4.5 4.6 3.9  CL 112* 114* 110  CO2 20* 13* 17*  GLUCOSE 122* 115* 125*  BUN 29* 28* 27*  CREATININE 1.43* 1.36* 1.24*  CALCIUM 9.4 9.6 9.6   Liver Function Tests: Recent Labs  Lab 12/17/19 1505 12/20/19 0941 12/23/19 0307  AST 15 19 15   ALT 8 7 7   ALKPHOS 29* 30* 36*  BILITOT 0.6 0.4 1.0  PROT 7.1 6.6 6.8  ALBUMIN 3.6 3.3* 3.5   No results for input(s): LIPASE, AMYLASE in the last 168 hours. CBC: Recent Labs  Lab 12/17/19 1505 12/17/19 1505 12/20/19 0941 12/23/19 0307 12/23/19 1201  WBC 13.4*   < > 11.1* 8.7 8.1  NEUTROABS 10.1*  --  8.5*  --   --   HGB 13.0   < > 11.9* 12.0 11.4*  HCT 39.5   < > 34.9* 37.5 34.2*  MCV 94.5  --  91.1 93.5 91.9  PLT 295   < > 242 293 291   < > = values in this interval not displayed.   Blood Culture    Component Value Date/Time   SDES  12/17/2019 2232    URINE, RANDOM Performed at [facility], [place] [address] [place], [place] [address]    SPECREQUEST  12/17/2019 2232    NONE Performed at [facility], [place] [address], [place], [place] [address]    CULT  12/17/2019 2232     NO GROWTH Performed at [place] [address] [place],  [address]    REPTSTATUS 12/18/2019 FINAL 12/17/2019 2232    Cardiac Enzymes: No results for input(s): CKTOTAL, CKMB, CKMBINDEX, TROPONINI in the last 168 hours. CBG: Recent Labs  Lab 12/23/19 0041  GLUCAP 141*   Iron Studies: No results for input(s): IRON, TIBC, TRANSFERRIN, FERRITIN in the last 72 hours. @lablastinr3 @ Studies/Results: NM Pulmonary Perfusion  Result Date: 12/23/2019 CLINICAL DATA:  Chest pain, elevated D-dimer. EXAM: NUCLEAR MEDICINE PERFUSION LUNG SCAN TECHNIQUE: Perfusion images were obtained in multiple projections after intravenous injection of radiopharmaceutical. Ventilation scans intentionally deferred if perfusion scan and chest x-ray adequate for interpretation during COVID 19 epidemic. RADIOPHARMACEUTICALS:  1.5 mCi Tc-100m MAA IV COMPARISON:  Chest x-ray today FINDINGS: Perfusion study demonstrates no segmental or subsegmental perfusion defects. IMPRESSION: No suspicious perfusion defects to suggest pulmonary embolus. Electronically Signed   By: Rolm Baptise M.D.   On: 12/23/2019 16:25   DG CHEST PORT 1 VIEW  Result Date: 12/23/2019 CLINICAL DATA:  Chest pain beginning today.  Pain EXAM: PORTABLE CHEST 1 VIEW COMPARISON:  12/21/2019.  12/18/2019.  01/31/2018. FINDINGS: Slightly progressive linear atelectasis at both lung bases. Upper lungs remain clear. No lobar collapse. No effusions. Heart size remains normal. Vascularity is normal. Minimal aortic atherosclerotic calcification. IMPRESSION: Slight further worsening of linear atelectasis in both lower lobes. Electronically Signed   By: Nelson Chimes M.D.   On: 12/23/2019 01:45   ECHOCARDIOGRAM COMPLETE  Result Date: 12/23/2019   ECHOCARDIOGRAM REPORT   Patient Name:   Brenda Nolan Date of Exam: 12/23/2019 Medical Rec #:  [id number]    Height:       67.0 in Accession #:    [id number]   Weight:       167.0 lb Date of Birth:  14-Jan-1951    BSA:           1.87 m Patient Age:    69 years     BP:           152/97 mmHg Patient Gender: F            HR:           101 bpm. Exam Location:  Inpatient Procedure: 2D Echo Indications:    Chest Pain 786.50 / R07.9  History:        Patient has no prior history of Echocardiogram examinations.                 Arrythmias:Tachycardia; Risk Factors:Hypertension. Chronic                 kidney disease.  Sonographer:    Vikki Ports Turrentine Referring Phys: [place]  1. Left ventricular ejection fraction, by visual estimation, is 65 to 70%. The left ventricle has hyperdynamic function. There is borderline left ventricular hypertrophy.  2. Left ventricular diastolic parameters are consistent with Grade I diastolic dysfunction (impaired relaxation).  3. The left ventricle has no regional wall motion abnormalities.  4. Global right ventricle has normal systolic function.The right ventricular size is normal. No increase in right ventricular wall thickness.  5. Left atrial size was normal.  6. Right atrial size was normal.  7. The mitral valve is grossly normal. Trivial mitral valve regurgitation.  8. The tricuspid valve is grossly normal.  9. The tricuspid valve is grossly normal. Tricuspid valve regurgitation is trivial. 10. The aortic valve is tricuspid. Aortic valve regurgitation is not visualized. 11. The pulmonic valve was normal in structure. Pulmonic valve regurgitation is not visualized. 12. The inferior vena cava is normal in size with greater than 50% respiratory variability, suggesting right atrial pressure of 3 mmHg. FINDINGS  Left Ventricle: Left ventricular ejection fraction, by visual estimation, is 65 to 70%. The left ventricle has hyperdynamic function. The left ventricle has no regional wall motion abnormalities. There is borderline left ventricular hypertrophy. Left ventricular diastolic parameters are consistent with Grade I diastolic dysfunction (impaired relaxation). Indeterminate filling  pressures. Right Ventricle: The right ventricular size is normal. No increase in right ventricular wall thickness. Global RV systolic function is has normal systolic function. Left Atrium: Left atrial size was normal in size. Right Atrium: Right atrial size was normal in size Pericardium: There is no evidence of pericardial effusion. Mitral Valve: The mitral valve is grossly normal. Trivial mitral valve regurgitation. Tricuspid Valve: The tricuspid valve is grossly normal. Tricuspid valve regurgitation is trivial. Aortic Valve: The aortic valve is tricuspid. Aortic valve regurgitation is not visualized. Pulmonic Valve: The pulmonic valve was normal in structure. Pulmonic valve regurgitation is not visualized. Pulmonic regurgitation is not  visualized. Aorta: The aortic root and ascending aorta are structurally normal, with no evidence of dilitation. Venous: The inferior vena cava is normal in size with greater than 50% respiratory variability, suggesting right atrial pressure of 3 mmHg. IAS/Shunts: No atrial level shunt detected by color flow Doppler.  LEFT VENTRICLE PLAX 2D LVIDd:         4.60 cm  Diastology LVIDs:         2.80 cm  LV e' lateral:   7.07 cm/s LV PW:         1.00 cm  LV E/e' lateral: 11.2 LV IVS:        1.00 cm  LV e' medial:    7.62 cm/s LVOT diam:     1.80 cm  LV E/e' medial:  10.4 LV SV:         68 ml LV SV Index:   35.57 LVOT Area:     2.54 cm  RIGHT VENTRICLE RV S prime:     17.50 cm/s TAPSE (M-mode): 3.6 cm LEFT ATRIUM             Index       RIGHT ATRIUM           Index LA diam:        3.70 cm 1.98 cm/m  RA Area:     12.40 cm LA Vol (A2C):   29.4 ml 15.70 ml/m RA Volume:   22.60 ml  12.07 ml/m LA Vol (A4C):   39.7 ml 21.20 ml/m LA Biplane Vol: 36.0 ml 19.22 ml/m  AORTIC VALVE LVOT Vmax:   117.00 cm/s LVOT Vmean:  83.400 cm/s LVOT VTI:    0.223 m  AORTA Ao Root diam: 2.60 cm MITRAL VALVE MV Area (PHT): 4.10 cm              SHUNTS MV PHT:        53.65 msec            Systemic VTI:  0.22 m  MV Decel Time: 185 msec              Systemic Diam: 1.80 cm MV E velocity: 79.00 cm/s  103 cm/s MV A velocity: 113.00 cm/s 70.3 cm/s MV E/A ratio:  0.70        1.5  Lyman Bishop MD Electronically signed by Lyman Bishop MD Signature Date/Time: 12/23/2019/4:49:30 PM    Final    Medications: . sodium chloride    . sodium chloride 50 mL/hr at 12/24/19 1239   . amLODipine  5 mg Oral Once  . amoxicillin  500 mg Oral Q8H  . cycloSPORINE  125 mg Oral q morning - 10a  . cycloSPORINE  150 mg Oral QHS  . divalproex  750 mg Oral QHS  . doxazosin  1 mg Oral QHS  . enoxaparin (LOVENOX) injection  40 mg Subcutaneous Q24H  . levothyroxine  50 mcg Oral Q0600  . LORazepam  0.5 mg Oral BID  . magnesium oxide  400 mg Oral Daily  . metoprolol tartrate  100 mg Oral BID  . mycophenolate  250 mg Oral BID  . OLANZapine  5 mg Oral QHS  . sodium bicarbonate  50 mEq Intravenous Once  . sodium bicarbonate  1,300 mg Oral BID  . thiamine  100 mg Oral Daily   Or  . thiamine  100 mg Intravenous Daily    Assessment/Plan: **s/p renal transplant: Baseline Cr 1-1.4.  CysA trough goal 150-200 per [facility] chart.  Level  pending but it was drawn at 3am from 10pm administration so will not use that.  Check tomorrow (2/8) AM 10am for true trough.  Cont current meds.  **bipolar DO: having SI; psych consulting  **NAGMA: suspect secondary to CNI. Supplemental bicarb to level 22.  Check tomorrow AM.   **h/o recurrent UTI:  UA 2/6 suggests infection, prior with enterococcus, started amoxicillin; f/u culture.  **Tachycardia and chest pain: per primary. Trops, TTE. CXR ok. VQ low risk.  Improving  **HTN: ok on home meds.   **bipolar: acute worsening poss secondary to UTI; psych seeing. Has sitter  Will follow, call with questions, concerns.   Jannifer Hick MD 12/24/2019, 2:05 PM  [facility] Pager: [phone]

## 2019-12-24 NOTE — Progress Notes (Signed)
PROGRESS NOTE  Brenda Nolan [id number] DOB: 31-Mar-1951 DOA: 12/17/2019 PCP: Patient, No Pcp Per  HPI/Recap of past 24 hours: Brenda Nolan  is a 68 y.o. female, anxiety, bipolar 1 disorders, stage IIIb chronic kidney disease, unspecified difficulty walking history, oropharyngeal phase dysphagia, history of C. difficile colitis, w recent admission 11/29/19 for AMS secondary to UTI, in psych ED since 1/31.  Was admitted with altered mental status and psychotic features with history of bipolar possibly decompensation.  Psychiatry consulted and was waiting for general psychiatric hospitalization.  The psych has signed off from case on December 22, 2019.  Patient was waiting to be transferred back to [place], per social worker they would take her on if there is no medical issues or if she no longer has one-on-one sitter for 24 hours.  But patient is now with elevated d-dimer, VQ pending. IV heparin for VTE treatment.  Also has metabolic acidosis and continues to voice suicidal ideation and has one-on-one sitter for safety.  December 23, 2019 Subjective: Patient seen and examined at bedside.  She is tremulous.  She is complaining stating that she committed suicide.  When I told her she still alive she said again she committed suicide and that she is bipolar when asked her why she committed suicide she says she has bipolar.  December 24, 2019. Subjective: Patient seen and examined at bedside with sitter at bedside.  Patient stated that she is no longer suicidal and that she wanted to remove the sitter. she actually stated she does not want to kill herself  Assessment/Plan: Principal Problem:   Bipolar 1 disorder, depressed ([place]) Active Problems:   Tachycardia  68 yoF in psych ED since 1/31.  Was admitted with altered mental status and psychotic features with history of bipolar possibly decompensation.  Psychiatry consulted and was waiting for general psychiatric hospitalization.  The psych has  signed off from case on December 22, 2019.  Patient was waiting to be transferred back to [place], per social worker they would take her on if there is no medical issues or if she no longer has one-on-one sitter for 24 hours.  But patient is now with elevated d-dimer, VQ pending. IV heparin for VTE treatment.  Also has metabolic acidosis and continues to voice suicidal ideation and has one-on-one sitter for safety.  1.  Tachycardia.  Patient's most likely anxious from her bipolar disorder  2.  Positive D-dimer.  Patient was originally scheduled to have VQ scan but due to agitation on her bipolar and psychotic to symptoms she may not sit still for the VQ scan.  They recommended doing CT angiogram.  I will order CT angiogram.  Patient is currently on heparin IV.  Due to her history of kidney transplant with one functioning kidney and creatinine of 1.24 we were unable to do the angiogram so we will try to do the VQ scan.  VQ scan was finally done and it showed no evidence of embolus.  So I will discontinue the heparin drip.  And change it to subacute heparin for DVT prophylaxis  3.  Bipolar disorder with psychotic features patient is supposedly suicidal.  But this might just be a manifestation of psychosis because she thinks she is already committed suicide.  She was seen by psychiatry who signed off yesterday.  She is waiting for transfer back to [facility] but they would not take her as long as she has a sitter at bedside  4.  Urinary tract  infection continue amoxicillin   Code Status: Full  Severity of Illness: The appropriate patient status for this patient is INPATIENT. Inpatient status is judged to be reasonable and necessary in order to provide the required intensity of service to ensure the patient's safety. The patient's presenting symptoms, physical exam findings, and initial radiographic and laboratory data in the context of their chronic comorbidities is felt to place  them at high risk for further clinical deterioration. Furthermore, it is not anticipated that the patient will be medically stable for discharge from the hospital within 2 midnights of admission. The following factors support the patient status of inpatient.   " Patient needs inpatient due to worsening of medical condition with metabolic acidosis patient needs VQ scan scan she is on IV heparin  * I certify that at the point of admission it is my clinical judgment that the patient will require inpatient hospital care spanning beyond 2 midnights from the point of admission due to high intensity of service, high risk for further deterioration and high frequency of surveillance required.*    Family Communication: Talk to son  Disposition Plan: Back to SNF.  Barrier to discharge: Patient is still voicing suicidality she is still psychotic she is on [employer] data [employer].  She also has metabolic acidosis with ongoing medical treatment Patient probably be ready to be discharged tomorrow  Consultants:  Psych  Procedures:  VQ scan  Antimicrobials:  Ceftriaxone  DVT prophylaxis: Heparin   Objective: Vitals:   12/23/19 2114 12/24/19 0500 12/24/19 0544 12/24/19 1311  BP: (!) 172/98  (!) 154/92 127/71  Pulse: (!) 127  (!) 101 95  Resp: 18  16 20   Temp: 99.6 F (37.6 C)  99.6 F (37.6 C) 99.6 F (37.6 C)  TempSrc: Oral  Oral Oral  SpO2: 98%  98% 98%  Weight:  78.3 kg    Height:        Intake/Output Summary (Last 24 hours) at 12/24/2019 1813 Last data filed at 12/24/2019 0914 Gross per 24 hour  Intake 681.01 ml  Output 800 ml  Net -118.99 ml   Filed Weights   12/23/19 0311 12/24/19 0500  Weight: 75.8 kg 78.3 kg   Body mass index is 27.04 kg/m.  Exam:   General: 69 y.o. year-old female well developed well nourished in no acute distress.  Alert and oriented x3.  Cardiovascular: Regular rate and rhythm with no rubs or gallops.  No thyromegaly or JVD noted.    Respiratory: Clear  to auscultation with no wheezes or rales. Good inspiratory effort.  Abdomen: Soft nontender nondistended with normal bowel sounds x4 quadrants.  Musculoskeletal: No lower extremity edema. 2/4 pulses in all 4 extremities.  Skin: No ulcerative lesions noted or rashes,  Psychiatry: Mood is appropriate for condition and setting    Data Reviewed: CBC: Recent Labs  Lab 12/20/19 0941 12/23/19 0307 12/23/19 1201  WBC 11.1* 8.7 8.1  NEUTROABS 8.5*  --   --   HGB 11.9* 12.0 11.4*  HCT 34.9* 37.5 34.2*  MCV 91.1 93.5 91.9  PLT 242 293 [id number]   Basic Metabolic Panel: Recent Labs  Lab 12/20/19 0941 12/21/19 1526 12/22/19 2210 12/23/19 0307  NA 139 141 140 136  K 3.7 4.5 4.6 3.9  CL 112* 112* 114* 110  CO2 16* 20* 13* 17*  GLUCOSE 141* 122* 115* 125*  BUN 34* 29* 28* 27*  CREATININE 1.44* 1.43* 1.36* 1.24*  CALCIUM 9.5 9.4 9.6 9.6  MG  --  2.1  --   --  GFR: Estimated Creatinine Clearance: 47.5 mL/min (A) (by C-G formula based on SCr of 1.24 mg/dL (H)). Liver Function Tests: Recent Labs  Lab 12/20/19 0941 12/23/19 0307  AST 19 15  ALT 7 7  ALKPHOS 30* 36*  BILITOT 0.4 1.0  PROT 6.6 6.8  ALBUMIN 3.3* 3.5   No results for input(s): LIPASE, AMYLASE in the last 168 hours. Recent Labs  Lab 12/22/19 2210  AMMONIA 28   Coagulation Profile: Recent Labs  Lab 12/23/19 0416  INR 1.1   Cardiac Enzymes: No results for input(s): CKTOTAL, CKMB, CKMBINDEX, TROPONINI in the last 168 hours. BNP (last 3 results) No results for input(s): PROBNP in the last 8760 hours. HbA1C: No results for input(s): HGBA1C in the last 72 hours. CBG: Recent Labs  Lab 12/23/19 0041  GLUCAP 141*   Lipid Profile: No results for input(s): CHOL, HDL, LDLCALC, TRIG, CHOLHDL, LDLDIRECT in the last 72 hours. Thyroid Function Tests: Recent Labs    12/23/19 0307  TSH 2.853   Anemia Panel: No results for input(s): VITAMINB12, FOLATE, FERRITIN, TIBC, IRON, RETICCTPCT in the last 72  hours. Urine analysis:    Component Value Date/Time   COLORURINE YELLOW 12/23/2019 1528   APPEARANCEUR HAZY (A) 12/23/2019 1528   LABSPEC 1.010 12/23/2019 1528   PHURINE 5.0 12/23/2019 1528   GLUCOSEU 50 (A) 12/23/2019 1528   HGBUR SMALL (A) 12/23/2019 1528   BILIRUBINUR NEGATIVE 12/23/2019 1528   KETONESUR NEGATIVE 12/23/2019 1528   PROTEINUR NEGATIVE 12/23/2019 1528   NITRITE NEGATIVE 12/23/2019 1528   LEUKOCYTESUR LARGE (A) 12/23/2019 1528   Sepsis Labs: @LABRCNTIP (procalcitonin:4,lacticidven:4)  ) Recent Results (from the past 240 hour(s))  Respiratory Panel by RT PCR (Flu A&B, Covid) - Nasopharyngeal Swab     Status: None   Collection Time: 12/17/19  2:37 PM   Specimen: Nasopharyngeal Swab  Result Value Ref Range Status   SARS Coronavirus 2 by RT PCR NEGATIVE NEGATIVE Final    Comment: (NOTE) SARS-CoV-2 target nucleic acids are NOT DETECTED. The SARS-CoV-2 RNA is generally detectable in upper respiratoy specimens during the acute phase of infection. The lowest concentration of SARS-CoV-2 viral copies this assay can detect is 131 copies/mL. A negative result does not preclude SARS-Cov-2 infection and should not be used as the sole basis for treatment or other patient management decisions. A negative result may occur with  improper specimen collection/handling, submission of specimen other than nasopharyngeal swab, presence of viral mutation(s) within the areas targeted by this assay, and inadequate number of viral copies (<131 copies/mL). A negative result must be combined with clinical observations, patient history, and epidemiological information. The expected result is Negative. Fact Sheet for Patients:  PinkCheek.be Fact Sheet for Healthcare Providers:  GravelBags.it This test is not yet ap proved or cleared by the [place] FDA and  has been authorized for detection and/or diagnosis of SARS-CoV-2  by FDA under an Emergency Use Authorization (EUA). This EUA will remain  in effect (meaning this test can be used) for the duration of the COVID-19 declaration under Section 564(b)(1) of the Act, 21 U.S.C. section 360bbb-3(b)(1), unless the authorization is terminated or revoked sooner.    Influenza A by PCR NEGATIVE NEGATIVE Final   Influenza B by PCR NEGATIVE NEGATIVE Final    Comment: (NOTE) The Xpert Xpress SARS-CoV-2/FLU/RSV assay is intended as an aid in  the diagnosis of influenza from Nasopharyngeal swab specimens and  should not be used as a sole basis for treatment. Nasal washings and  aspirates are unacceptable  for Xpert Xpress SARS-CoV-2/FLU/RSV  testing. Fact Sheet for Patients: PinkCheek.be Fact Sheet for Healthcare Providers: GravelBags.it This test is not yet approved or cleared by the [place] FDA and  has been authorized for detection and/or diagnosis of SARS-CoV-2 by  FDA under an Emergency Use Authorization (EUA). This EUA will remain  in effect (meaning this test can be used) for the duration of the  Covid-19 declaration under Section 564(b)(1) of the Act, 21  U.S.C. section 360bbb-3(b)(1), unless the authorization is  terminated or revoked. Performed at [facility], [place] [address] [place], [place] [address]   Urine culture     Status: None   Collection Time: 12/17/19 10:32 PM   Specimen: Urine, Random  Result Value Ref Range Status   Specimen Description   Final    URINE, RANDOM Performed at [facility] [address], [place], [place] [address]    Special Requests   Final    NONE Performed at [facility], [place] [address], [place], [place] [address]    Culture   Final    NO GROWTH Performed at [facility], [place] [address] [place], [place] [address]    Report Status 12/18/2019 FINAL  Final  Respiratory Panel by RT PCR (Flu  A&B, Covid) - Nasopharyngeal Swab     Status: None   Collection Time: 12/19/19 10:14 PM   Specimen: Nasopharyngeal Swab  Result Value Ref Range Status   SARS Coronavirus 2 by RT PCR NEGATIVE NEGATIVE Final    Comment: (NOTE) SARS-CoV-2 target nucleic acids are NOT DETECTED. The SARS-CoV-2 RNA is generally detectable in upper respiratoy specimens during the acute phase of infection. The lowest concentration of SARS-CoV-2 viral copies this assay can detect is 131 copies/mL. A negative result does not preclude SARS-Cov-2 infection and should not be used as the sole basis for treatment or other patient management decisions. A negative result may occur with  improper specimen collection/handling, submission of specimen other than nasopharyngeal swab, presence of viral mutation(s) within the areas targeted by this assay, and inadequate number of viral copies (<131 copies/mL). A negative result must be combined with clinical observations, patient history, and epidemiological information. The expected result is Negative. Fact Sheet for Patients:  PinkCheek.be Fact Sheet for Healthcare Providers:  GravelBags.it This test is not yet ap proved or cleared by the [place] FDA and  has been authorized for detection and/or diagnosis of SARS-CoV-2 by FDA under an Emergency Use Authorization (EUA). This EUA will remain  in effect (meaning this test can be used) for the duration of the COVID-19 declaration under Section 564(b)(1) of the Act, 21 U.S.C. section 360bbb-3(b)(1), unless the authorization is terminated or revoked sooner.    Influenza A by PCR NEGATIVE NEGATIVE Final   Influenza B by PCR NEGATIVE NEGATIVE Final    Comment: (NOTE) The Xpert Xpress SARS-CoV-2/FLU/RSV assay is intended as an aid in  the diagnosis of influenza from Nasopharyngeal swab specimens and  should not be used as a sole basis for treatment. Nasal washings  and  aspirates are unacceptable for Xpert Xpress SARS-CoV-2/FLU/RSV  testing. Fact Sheet for Patients: PinkCheek.be Fact Sheet for Healthcare Providers: GravelBags.it This test is not yet approved or cleared by the [place] FDA and  has been authorized for detection and/or diagnosis of SARS-CoV-2 by  FDA under an Emergency Use Authorization (EUA). This EUA will remain  in effect (meaning this test can be used) for the duration of the  Covid-19 declaration under Section 564(b)(1) of the Act, 21  U.S.C. section 360bbb-3(b)(1), unless the authorization is  terminated or revoked. Performed at [facility], [place] [address], [place], [place] [address]       Studies: No results found.  Scheduled Meds:  amLODipine  5 mg Oral Once   amoxicillin  500 mg Oral Q8H   cycloSPORINE  125 mg Oral q morning - 10a   cycloSPORINE  150 mg Oral QHS   divalproex  750 mg Oral QHS   doxazosin  1 mg Oral QHS   enoxaparin (LOVENOX) injection  40 mg Subcutaneous Q24H   levothyroxine  50 mcg Oral Q0600   LORazepam  0.5 mg Oral BID   magnesium oxide  400 mg Oral Daily   metoprolol tartrate  100 mg Oral BID   mycophenolate  250 mg Oral BID   OLANZapine  5 mg Oral QHS   sodium bicarbonate  50 mEq Intravenous Once   sodium bicarbonate  1,300 mg Oral BID   thiamine  100 mg Oral Daily   Or   thiamine  100 mg Intravenous Daily    Continuous Infusions:  sodium chloride 50 mL/hr at 12/24/19 1239     LOS: 1 day     Cristal Deer, MD [facility]  To reach me or the doctor on call, go to: [email or web address] Password [facility]  12/24/2019, 6:13 PM

## 2019-12-25 ENCOUNTER — Other Ambulatory Visit: Payer: Self-pay

## 2019-12-25 DIAGNOSIS — N390 Urinary tract infection, site not specified: Secondary | ICD-10-CM

## 2019-12-25 LAB — RENAL FUNCTION PANEL
Albumin: 3.1 g/dL — ABNORMAL LOW (ref 3.5–5.0)
Anion gap: 8 (ref 5–15)
BUN: 32 mg/dL — ABNORMAL HIGH (ref 8–23)
CO2: 20 mmol/L — ABNORMAL LOW (ref 22–32)
Calcium: 9.1 mg/dL (ref 8.9–10.3)
Chloride: 111 mmol/L (ref 98–111)
Creatinine, Ser: 1.21 mg/dL — ABNORMAL HIGH (ref 0.44–1.00)
GFR calc Af Amer: 54 mL/min — ABNORMAL LOW (ref >60)
GFR calc non Af Amer: 46 mL/min — ABNORMAL LOW (ref >60)
Glucose, Bld: 95 mg/dL (ref 70–99)
Phosphorus: 3.2 mg/dL (ref 2.5–4.6)
Potassium: 4.6 mmol/L (ref 3.5–5.1)
Sodium: 139 mmol/L (ref 135–145)

## 2019-12-25 LAB — URINE CULTURE: Culture: 60000 — AB

## 2019-12-25 LAB — MYCOPHENOLIC ACID (CELLCEPT)
MPA Glucuronide: 22 ug/mL (ref 15–125)
MPA: 0.3 ug/mL — ABNORMAL LOW (ref 1.0–3.5)

## 2019-12-25 NOTE — Plan of Care (Signed)

## 2019-12-25 NOTE — Progress Notes (Signed)
PROGRESS NOTE  Brenda Nolan [id number] DOB: 1951/05/21 DOA: 12/17/2019 PCP: Patient, No Pcp Per  HPI/Recap of past 24 hours: Brenda Nolan  is a 69 y.o. female, anxiety, bipolar 1 disorders, stage IIIb chronic kidney disease, unspecified difficulty walking history, oropharyngeal phase dysphagia, history of C. difficile colitis, w recent admission 11/29/19 for AMS secondary to UTI, in psych ED since 1/31.  Was admitted with altered mental status and psychotic features with history of bipolar possibly decompensation.  Psychiatry consulted. Patient was waiting to be transferred back to [place], per social worker they would take her on if there is no medical issues or if she no longer has one-on-one sitter for 24 hours. Pt continues to voice suicidal ideation and has one-on-one sitter for safety. Currently being treated for possible UTI.   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 bicarb Daily BMP  Hypertension BP stable-soft Continue home metoprolol, hold amlodipine  Hypothyroidism Continue Synthroid  Bipolar disorder with psychotic features More of psychotic  features Psych to reevaluate to determine the need for inpatient psych versus SNF Need to be off sitter at bedside for 24 hours to be eligible for SNF at [facility], Zyprexa     Code Status: Full  Severity of Illness: The appropriate patient status for this patient is INPATIENT. Inpatient status is judged to be reasonable and necessary in order to provide the required intensity of service to ensure the patient's safety. The patient's presenting symptoms, physical exam findings, and initial radiographic and laboratory data in the context of their chronic comorbidities is felt to place them at high risk for further clinical deterioration. Furthermore, it is not anticipated that the patient will be medically stable for discharge from the hospital within 2 midnights of admission. The following factors support the patient status of inpatient.   " Patient currently improving, needs consultants sign off, also needs to be off one-to-one sitter for 24 hours prior to DC to SNF at [facility].  Will reconsult psych to further re-evaluate for inpatient psych  * I certify that at the point of admission it is my clinical judgment that the patient will require inpatient hospital care spanning beyond 2 midnights from the point of admission due to high intensity of service, high risk for further deterioration and high frequency of surveillance required.*    Family Communication: None at bedside  Disposition Plan:  Patient currently improving, needs consultants sign off, also needs to be off one-to-one sitter for 24 hours prior to DC to SNF at [facility].  Will reconsult psych to further re-evaluate for inpatient psych   Consultants:  Psych  Nephrology  Procedures:  VQ scan  Antimicrobials:  Amoxicillin  DVT prophylaxis: Lovenox   Objective: Vitals:   12/24/19 1311 12/24/19 2047 12/25/19 0626 12/25/19 1351  BP: 127/71 135/79 130/87 107/80  Pulse: 95 96 84 84   Resp: 20 20 18 20   Temp: 99.6 F (37.6 C) 99.3 F (37.4 C) 99 F (37.2 C) 99.4 F (37.4 C)  TempSrc: Oral Oral Oral Oral  SpO2: 98% 98% 97% 99%  Weight:   77 kg   Height:        Intake/Output Summary (Last 24 hours) at 12/25/2019 1610 Last data filed at 12/25/2019 1154 Gross per 24 hour  Intake 1735.85 ml  Output 500 ml  Net 1235.85 ml   Filed Weights   12/23/19 0311 12/24/19 0500 12/25/19 0626  Weight: 75.8 kg 78.3 kg 77 kg   Body mass index is 26.59 kg/m.  Exam:  General: NAD   Cardiovascular: S1, S2 present  Respiratory: CTAB  Abdomen: Soft, nontender, nondistended, bowel sounds present  Musculoskeletal: No bilateral pedal edema noted  Skin: Normal  Psychiatry:  Noted psychosis    Data Reviewed: CBC: Recent Labs  Lab 12/20/19 0941 12/23/19 0307 12/23/19 1201  WBC 11.1* 8.7 8.1  NEUTROABS 8.5*  --   --   HGB 11.9* 12.0 11.4*  HCT 34.9* 37.5 34.2*  MCV 91.1 93.5 91.9  PLT 242 293 [id number]   Basic Metabolic Panel: Recent Labs  Lab 12/20/19 0941 12/21/19 1526 12/22/19 2210 12/23/19 0307 12/25/19 0444  NA 139 141 140 136 139  K 3.7 4.5 4.6 3.9 4.6  CL 112* 112* 114* 110 111  CO2 16* 20* 13* 17* 20*  GLUCOSE 141* 122* 115* 125* 95  BUN 34* 29* 28* 27* 32*  CREATININE 1.44* 1.43* 1.36* 1.24* 1.21*  CALCIUM 9.5 9.4 9.6 9.6 9.1  MG  --  2.1  --   --   --   PHOS  --   --   --   --  3.2   GFR: Estimated Creatinine Clearance: 48.3 mL/min (A) (by C-G formula based on SCr of 1.21 mg/dL (H)). Liver Function Tests: Recent Labs  Lab 12/20/19 0941 12/23/19 0307 12/25/19 0444  AST 19 15  --   ALT 7 7  --   ALKPHOS 30* 36*  --   BILITOT 0.4 1.0  --   PROT 6.6 6.8  --   ALBUMIN 3.3* 3.5 3.1*   No results for input(s): LIPASE, AMYLASE in the last 168 hours. Recent Labs  Lab 12/22/19 2210  AMMONIA 28   Coagulation Profile: Recent Labs  Lab 12/23/19 0416  INR 1.1   Cardiac Enzymes: No results for input(s): CKTOTAL, CKMB, CKMBINDEX,  TROPONINI in the last 168 hours. BNP (last 3 results) No results for input(s): PROBNP in the last 8760 hours. HbA1C: No results for input(s): HGBA1C in the last 72 hours. CBG: Recent Labs  Lab 12/23/19 0041  GLUCAP 141*   Lipid Profile: No results for input(s): CHOL, HDL, LDLCALC, TRIG, CHOLHDL, LDLDIRECT in the last 72 hours. Thyroid Function Tests: Recent Labs    12/23/19 0307  TSH 2.853   Anemia Panel: No results for input(s): VITAMINB12, FOLATE, FERRITIN, TIBC, IRON, RETICCTPCT in the last 72 hours. Urine analysis:    Component Value Date/Time   COLORURINE YELLOW 12/23/2019 1528   APPEARANCEUR HAZY (A) 12/23/2019 1528   LABSPEC 1.010 12/23/2019 1528   PHURINE 5.0 12/23/2019 1528   GLUCOSEU 50 (A) 12/23/2019 1528   HGBUR SMALL (A) 12/23/2019 1528   BILIRUBINUR NEGATIVE 12/23/2019 1528   KETONESUR NEGATIVE 12/23/2019 1528   PROTEINUR NEGATIVE 12/23/2019 1528   NITRITE NEGATIVE 12/23/2019 1528   LEUKOCYTESUR LARGE (  A) 12/23/2019 1528   Sepsis Labs: @LABRCNTIP (procalcitonin:4,lacticidven:4)  ) Recent Results (from the past 240 hour(s))  Respiratory Panel by RT PCR (Flu A&B, Covid) - Nasopharyngeal Swab     Status: None   Collection Time: 12/17/19  2:37 PM   Specimen: Nasopharyngeal Swab  Result Value Ref Range Status   SARS Coronavirus 2 by RT PCR NEGATIVE NEGATIVE Final    Comment: (NOTE) SARS-CoV-2 target nucleic acids are NOT DETECTED. The SARS-CoV-2 RNA is generally detectable in upper respiratoy specimens during the acute phase of infection. The lowest concentration of SARS-CoV-2 viral copies this assay can detect is 131 copies/mL. A negative result does not preclude SARS-Cov-2 infection and should not be used as the sole basis for treatment or other patient management decisions. A negative result may occur with  improper specimen collection/handling, submission of specimen other than nasopharyngeal swab, presence of viral mutation(s) within the areas  targeted by this assay, and inadequate number of viral copies (<131 copies/mL). A negative result must be combined with clinical observations, patient history, and epidemiological information. The expected result is Negative. Fact Sheet for Patients:  PinkCheek.be Fact Sheet for Healthcare Providers:  GravelBags.it This test is not yet ap proved or cleared by the [place] FDA and  has been authorized for detection and/or diagnosis of SARS-CoV-2 by FDA under an Emergency Use Authorization (EUA). This EUA will remain  in effect (meaning this test can be used) for the duration of the COVID-19 declaration under Section 564(b)(1) of the Act, 21 U.S.C. section 360bbb-3(b)(1), unless the authorization is terminated or revoked sooner.    Influenza A by PCR NEGATIVE NEGATIVE Final   Influenza B by PCR NEGATIVE NEGATIVE Final    Comment: (NOTE) The Xpert Xpress SARS-CoV-2/FLU/RSV assay is intended as an aid in  the diagnosis of influenza from Nasopharyngeal swab specimens and  should not be used as a sole basis for treatment. Nasal washings and  aspirates are unacceptable for Xpert Xpress SARS-CoV-2/FLU/RSV  testing. Fact Sheet for Patients: PinkCheek.be Fact Sheet for Healthcare Providers: GravelBags.it This test is not yet approved or cleared by the [place] FDA and  has been authorized for detection and/or diagnosis of SARS-CoV-2 by  FDA under an Emergency Use Authorization (EUA). This EUA will remain  in effect (meaning this test can be used) for the duration of the  Covid-19 declaration under Section 564(b)(1) of the Act, 21  U.S.C. section 360bbb-3(b)(1), unless the authorization is  terminated or revoked. Performed at [facility], [place] [address] [place], [place] [address]   Urine culture     Status: None   Collection Time: 12/17/19 10:32  PM   Specimen: Urine, Random  Result Value Ref Range Status   Specimen Description   Final    URINE, RANDOM Performed at [place] [address] [place], [place] [address]    Special Requests   Final    NONE Performed at [facility], [place] [address] [place], [place] [address]    Culture   Final    NO GROWTH Performed at [facility], [place] [address], [place], [place] [address]    Report Status 12/18/2019 FINAL  Final  Respiratory Panel by RT PCR (Flu A&B, Covid) - Nasopharyngeal Swab     Status: None   Collection Time: 12/19/19 10:14 PM   Specimen: Nasopharyngeal Swab  Result Value Ref Range Status   SARS Coronavirus 2 by RT PCR NEGATIVE NEGATIVE Final  Comment: (NOTE) SARS-CoV-2 target nucleic acids are NOT DETECTED. The SARS-CoV-2 RNA is generally detectable in upper respiratoy specimens during the acute phase of infection. The lowest concentration of SARS-CoV-2 viral copies this assay can detect is 131 copies/mL. A negative result does not preclude SARS-Cov-2 infection and should not be used as the sole basis for treatment or other patient management decisions. A negative result may occur with  improper specimen collection/handling, submission of specimen other than nasopharyngeal swab, presence of viral mutation(s) within the areas targeted by this assay, and inadequate number of viral copies (<131 copies/mL). A negative result must be combined with clinical observations, patient history, and epidemiological information. The expected result is Negative. Fact Sheet for Patients:  PinkCheek.be Fact Sheet for Healthcare Providers:  GravelBags.it This test is not yet ap proved or cleared by the [place] FDA and  has been authorized for detection and/or diagnosis of SARS-CoV-2 by FDA under an Emergency Use Authorization (EUA). This EUA will remain  in effect  (meaning this test can be used) for the duration of the COVID-19 declaration under Section 564(b)(1) of the Act, 21 U.S.C. section 360bbb-3(b)(1), unless the authorization is terminated or revoked sooner.    Influenza A by PCR NEGATIVE NEGATIVE Final   Influenza B by PCR NEGATIVE NEGATIVE Final    Comment: (NOTE) The Xpert Xpress SARS-CoV-2/FLU/RSV assay is intended as an aid in  the diagnosis of influenza from Nasopharyngeal swab specimens and  should not be used as a sole basis for treatment. Nasal washings and  aspirates are unacceptable for Xpert Xpress SARS-CoV-2/FLU/RSV  testing. Fact Sheet for Patients: PinkCheek.be Fact Sheet for Healthcare Providers: GravelBags.it This test is not yet approved or cleared by the [place] FDA and  has been authorized for detection and/or diagnosis of SARS-CoV-2 by  FDA under an Emergency Use Authorization (EUA). This EUA will remain  in effect (meaning this test can be used) for the duration of the  Covid-19 declaration under Section 564(b)(1) of the Act, 21  U.S.C. section 360bbb-3(b)(1), unless the authorization is  terminated or revoked. Performed at [facility], [place] [address], [place], [place] [address]   Culture, Urine     Status: Abnormal   Collection Time: 12/23/19  3:28 PM   Specimen: Urine, Clean Catch  Result Value Ref Range Status   Specimen Description URINE, CLEAN CATCH  Final   Special Requests   Final    NONE Performed at [facility] [address] [place], [place] [address]    Culture (A)  Final    60,000 COLONIES/mL MULTIPLE SPECIES PRESENT, SUGGEST RECOLLECTION   Report Status 12/25/2019 FINAL  Final      Studies: No results found.  Scheduled Meds: . amLODipine  5 mg Oral Once  . amoxicillin  500 mg Oral Q8H  . cycloSPORINE  125 mg Oral q morning - 10a  . cycloSPORINE  150 mg Oral QHS  . divalproex  750 mg  Oral QHS  . doxazosin  1 mg Oral QHS  . enoxaparin (LOVENOX) injection  40 mg Subcutaneous Q24H  . levothyroxine  50 mcg Oral Q0600  . LORazepam  0.5 mg Oral BID  . magnesium oxide  400 mg Oral Daily  . metoprolol tartrate  100 mg Oral BID  . mycophenolate  250 mg Oral BID  . OLANZapine  5 mg Oral QHS  . sodium bicarbonate  50 mEq Intravenous Once  . sodium bicarbonate  1,300 mg Oral BID  .  thiamine  100 mg Oral Daily   Or  . thiamine  100 mg Intravenous Daily    Continuous Infusions: . sodium chloride Stopped (12/25/19 0930)     LOS: 2 days     Alma Friendly, MD [facility]  12/25/2019, 4:10 PM

## 2019-12-25 NOTE — Progress Notes (Signed)
[facility] Progress Note   Subjective:   1.3 L uop yest, pt responsive  Objective Vitals:   12/24/19 1311 12/24/19 2047 12/25/19 0626 12/25/19 1351  BP: 127/71 135/79 130/87 107/80  Pulse: 95 96 84 84  Resp: 20 20 18 20   Temp: 99.6 F (37.6 C) 99.3 F (37.4 C) 99 F (37.2 C) 99.4 F (37.4 C)  TempSrc: Oral Oral Oral Oral  SpO2: 98% 98% 97% 99%  Weight:   77 kg   Height:       Physical Exam General: chronically ill appearing woman who is comfortable Heart: RRR, 90s Lungs: normal WOB Abdomen: soft, RLQ kidney not palpable or tender Extremities: no edema Neuro/psych: oriented to [place] [facility], 2024  Assessment/Plan: **s/p renal transplant: Baseline Cr 1-1.4.  CysA trough goal 150-200 per [facility] chart.  Level pending but it was drawn at 3am from 10pm administration so will not use that.  Checking today for true trough 10am.  Cont current meds.  **bipolar DO: having SI; psych consulting  **NAGMA: suspect secondary to CNI. Supplemental bicarb to level 22.  Check tomorrow AM.   **h/o recurrent UTI:  UA 2/6 suggests infection, prior with enterococcus, started amoxicillin; f/u culture.  **Tachycardia and chest pain: per primary. Trops, TTE. CXR ok. VQ low risk.  Improving  **HTN: ok on home meds.   **bipolar: acute worsening poss secondary to UTI; psych seeing. Has sitter  Will follow, call with questions, concerns.   Kelly Splinter, MD 12/25/2019, 3:32 PM  Additional Objective Labs: Basic Metabolic Panel: Recent Labs  Lab 12/22/19 2210 12/23/19 0307 12/25/19 0444  NA 140 136 139  K 4.6 3.9 4.6  CL 114* 110 111  CO2 13* 17* 20*  GLUCOSE 115* 125* 95  BUN 28* 27* 32*  CREATININE 1.36* 1.24* 1.21*  CALCIUM 9.6 9.6 9.1  PHOS  --   --  3.2   Liver Function Tests: Recent Labs  Lab 12/20/19 0941 12/23/19 0307 12/25/19 0444  AST 19 15  --   ALT 7 7  --   ALKPHOS 30* 36*  --   BILITOT 0.4 1.0  --   PROT 6.6 6.8  --   ALBUMIN 3.3* 3.5 3.1*    No results for input(s): LIPASE, AMYLASE in the last 168 hours. CBC: Recent Labs  Lab 12/20/19 0941 12/23/19 0307 12/23/19 1201  WBC 11.1* 8.7 8.1  NEUTROABS 8.5*  --   --   HGB 11.9* 12.0 11.4*  HCT 34.9* 37.5 34.2*  MCV 91.1 93.5 91.9  PLT 242 293 291   Blood Culture    Component Value Date/Time   SDES URINE, CLEAN CATCH 12/23/2019 1528   SPECREQUEST  12/23/2019 1528    NONE Performed at [facility], [place] [address] [place], [place] [address]    CULT (A) 12/23/2019 1528    60,000 COLONIES/mL MULTIPLE SPECIES PRESENT, SUGGEST RECOLLECTION   REPTSTATUS 12/25/2019 FINAL 12/23/2019 1528    Cardiac Enzymes: No results for input(s): CKTOTAL, CKMB, CKMBINDEX, TROPONINI in the last 168 hours. CBG: Recent Labs  Lab 12/23/19 0041  GLUCAP 141*   Iron Studies: No results for input(s): IRON, TIBC, TRANSFERRIN, FERRITIN in the last 72 hours. @lablastinr3 @ Studies/Results: NM Pulmonary Perfusion  Result Date: 12/23/2019 CLINICAL DATA:  Chest pain, elevated D-dimer. EXAM: NUCLEAR MEDICINE PERFUSION LUNG SCAN TECHNIQUE: Perfusion images were obtained in multiple projections after intravenous injection of radiopharmaceutical. Ventilation scans intentionally deferred if perfusion scan and chest x-ray adequate for interpretation during COVID 19 epidemic.  RADIOPHARMACEUTICALS:  1.5 mCi Tc-5m MAA IV COMPARISON:  Chest x-ray today FINDINGS: Perfusion study demonstrates no segmental or subsegmental perfusion defects. IMPRESSION: No suspicious perfusion defects to suggest pulmonary embolus. Electronically Signed   By: Rolm Baptise M.D.   On: 12/23/2019 16:25   Medications: . sodium chloride Stopped (12/25/19 0930)   . amLODipine  5 mg Oral Once  . amoxicillin  500 mg Oral Q8H  . cycloSPORINE  125 mg Oral q morning - 10a  . cycloSPORINE  150 mg Oral QHS  . divalproex  750 mg Oral QHS  . doxazosin  1 mg Oral QHS  . enoxaparin (LOVENOX) injection  40 mg Subcutaneous  Q24H  . levothyroxine  50 mcg Oral Q0600  . LORazepam  0.5 mg Oral BID  . magnesium oxide  400 mg Oral Daily  . metoprolol tartrate  100 mg Oral BID  . mycophenolate  250 mg Oral BID  . OLANZapine  5 mg Oral QHS  . sodium bicarbonate  50 mEq Intravenous Once  . sodium bicarbonate  1,300 mg Oral BID  . thiamine  100 mg Oral Daily   Or  . thiamine  100 mg Intravenous Daily

## 2019-12-25 NOTE — Progress Notes (Signed)
Nutrition Brief Note  Patient identified on the Malnutrition Screening Tool (MST) Report  Wt Readings from Last 15 Encounters:  12/25/19 77 kg  12/16/19 81 kg  11/30/19 81 kg    Body mass index is 26.59 kg/m. Patient meets criteria for overweight status based on current BMI. Current weight is 170 lb and weight on 11/30/19 was 178 lb. This indicates 8 lb (4.4% body weight) loss in 3 weeks.   Current diet order is Heart Healthy and patient recently consumed the following at meals: 2/5- 100% of breakfast, 100% of lunch (total of 1491 kcal, 66 grams protein) 2/6- 100% of breakfast, 100% of lunch (total of 1436 kcal, 61 grams protein) 2/7- 100% of all meals (total of 1706 kcal, 88 grams protein) 2/8- 100% of breakfast (648 kcal, 26 grams protein)   Labs and medications reviewed. No nutrition interventions warranted at this time. If nutrition issues arise, please consult RD.     Brenda Matin, MS, RD, LDN, CNSC Inpatient Clinical Dietitian RD pager # available in [place]  After hours/weekend pager # available in [facility]

## 2019-12-26 LAB — CYCLOSPORINE: Cyclosporine, LabCorp: 208 ng/mL (ref 100–400)

## 2019-12-26 MED ORDER — LORAZEPAM 0.5 MG PO TABS
0.5000 mg | ORAL_TABLET | Freq: Three times a day (TID) | ORAL | Status: DC
  Administered 2019-12-26 – 2020-01-05 (×30): 0.5 mg via ORAL
  Filled 2019-12-26 (×30): qty 1

## 2019-12-26 NOTE — Care Management Important Message (Signed)
Important Message  Patient Details IM Letter given to Gabriel Earing RN Case Manager to present to the Patient Name: Brenda Nolan MRN: [id number] Date of Birth: 11/22/1950   Medicare Important Message Given:  Yes     Kerin Salen 12/26/2019, 11:59 AM

## 2019-12-26 NOTE — Progress Notes (Deleted)
[facility] Progress Note   Subjective:   Creat flat at 1.2, UOP 500 cc recorded yest  Objective Vitals:   12/25/19 1351 12/25/19 2120 12/25/19 2206 12/26/19 0500  BP: 107/80 139/78 (!) 143/78 127/73  Pulse: 84 92 84 81  Resp: 20   19  Temp: 99.4 F (37.4 C) 100.2 F (37.9 C)  98.9 F (37.2 C)  TempSrc: Oral Oral  Oral  SpO2: 99% 98%  97%  Weight:    77.7 kg  Height:       Physical Exam General: chronically ill appearing woman who is comfortable Heart: RRR, 90s Lungs: normal WOB Abdomen: soft, RLQ kidney not palpable or tender Extremities: no edema Neuro/psych: oriented to [place] [facility], 2024  Assessment/Plan: **s/p renal transplant: Baseline Cr 1-1.4.  CysA trough goal 150-200 per [facility] chart.  Level pending but it was drawn at 3am from 10pm administration so will not use that.  Checking now for true trough 10am drawn yest am. Cont current meds.  **bipolar DO: having SI; psych consulting  **NAGMA: suspect secondary to CNI. Supplemental bicarb to level 22.  Check tomorrow AM.   **h/o recurrent UTI:  UA 2/6 shows 6-10wbc, few bact and 0-5 rbc, prior with enterococcus, started amoxicillin; cx shows 60,000 col mult species present  **Tachycardia and chest pain: per primary. Trops, TTE. CXR ok. VQ low risk.  Improving  **HTN: ok on home meds.   **bipolar: acute worsening poss secondary to UTI; psych seeing. Has sitter  Will follow, call with questions, concerns.   Kelly Splinter, MD 12/25/2019, 3:32 PM  Additional Objective Labs: Basic Metabolic Panel: Recent Labs  Lab 12/22/19 2210 12/23/19 0307 12/25/19 0444  NA 140 136 139  K 4.6 3.9 4.6  CL 114* 110 111  CO2 13* 17* 20*  GLUCOSE 115* 125* 95  BUN 28* 27* 32*  CREATININE 1.36* 1.24* 1.21*  CALCIUM 9.6 9.6 9.1  PHOS  --   --  3.2   Liver Function Tests: Recent Labs  Lab 12/20/19 0941 12/23/19 0307 12/25/19 0444  AST 19 15  --   ALT 7 7  --   ALKPHOS 30* 36*  --   BILITOT 0.4 1.0   --   PROT 6.6 6.8  --   ALBUMIN 3.3* 3.5 3.1*   No results for input(s): LIPASE, AMYLASE in the last 168 hours. CBC: Recent Labs  Lab 12/20/19 0941 12/23/19 0307 12/23/19 1201  WBC 11.1* 8.7 8.1  NEUTROABS 8.5*  --   --   HGB 11.9* 12.0 11.4*  HCT 34.9* 37.5 34.2*  MCV 91.1 93.5 91.9  PLT 242 293 291   Blood Culture    Component Value Date/Time   SDES URINE, CLEAN CATCH 12/23/2019 1528   SPECREQUEST  12/23/2019 1528    NONE Performed at [facility], [place] [address] [place], [place] [address]    CULT (A) 12/23/2019 1528    60,000 COLONIES/mL MULTIPLE SPECIES PRESENT, SUGGEST RECOLLECTION   REPTSTATUS 12/25/2019 FINAL 12/23/2019 1528    Cardiac Enzymes: No results for input(s): CKTOTAL, CKMB, CKMBINDEX, TROPONINI in the last 168 hours. CBG: Recent Labs  Lab 12/23/19 0041  GLUCAP 141*   Iron Studies: No results for input(s): IRON, TIBC, TRANSFERRIN, FERRITIN in the last 72 hours. @lablastinr3 @ Studies/Results: No results found. Medications:  . amoxicillin  500 mg Oral Q8H  . cycloSPORINE  125 mg Oral q morning - 10a  . cycloSPORINE  150 mg Oral QHS  . divalproex  750 mg  Oral QHS  . doxazosin  1 mg Oral QHS  . enoxaparin (LOVENOX) injection  40 mg Subcutaneous Q24H  . levothyroxine  50 mcg Oral Q0600  . LORazepam  0.5 mg Oral BID  . magnesium oxide  400 mg Oral Daily  . metoprolol tartrate  100 mg Oral BID  . mycophenolate  250 mg Oral BID  . OLANZapine  5 mg Oral QHS  . sodium bicarbonate  50 mEq Intravenous Once  . sodium bicarbonate  1,300 mg Oral BID  . thiamine  100 mg Oral Daily   Or  . thiamine  100 mg Intravenous Daily

## 2019-12-26 NOTE — Progress Notes (Signed)
PROGRESS NOTE  Brenda Nolan [id number] DOB: 19-May-1951 DOA: 12/17/2019 PCP: Patient, No Pcp Per  HPI/Recap of past 24 hours: Brenda Nolan  is a 69 y.o. female, anxiety, bipolar 1 disorders, stage IIIb chronic kidney disease, unspecified difficulty walking history, oropharyngeal phase dysphagia, history of C. difficile colitis, w recent admission 11/29/19 for AMS secondary to UTI, in psych ED since 1/31.  Was admitted with altered mental status and psychotic features with history of bipolar possibly decompensation.  Psychiatry consulted. Patient was waiting to be transferred back to [place], per social worker they would take her on if there is no medical issues or if she no longer has one-on-one sitter for 24 hours. Pt continues to voice suicidal ideation and has one-on-one sitter for safety. Currently being treated for possible UTI.    Patient continues to have psychotic episodes, stating she really committed suicide etc..  Patient denies any other new complaints.  Noted to be somewhat anxious/agitated/yelling.  Patient is medically stable for discharge, but may need psychiatric inpatient admission.  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 bicarb Daily BMP  Hypertension BP stable Continue home metoprolol, hold amlodipine  Hypothyroidism Continue Synthroid  Bipolar disorder with psychotic features More of psychotic features Psych to reevaluate to determine the need for inpatient psych versus SNF Need to be off sitter at bedside for 24 hours to be eligible for SNF at [place], Zyprexa     Code Status: Full  Severity of Illness: The appropriate patient status for this patient is INPATIENT. Inpatient status is judged to be reasonable and necessary in order to provide the required intensity of service to ensure the patient's safety. The patient's presenting symptoms, physical exam findings, and initial radiographic and laboratory data in the context of their chronic comorbidities is felt to place them at high risk for further clinical deterioration. Furthermore, it is not anticipated that the patient will be medically stable for discharge from the hospital within 2 midnights of admission. The following factors support the patient status of inpatient.   " Patient currently medically stable, may need psychiatric inpatient.  Needs to be off one-to-one sitter for 24 hours prior to DC to SNF at [facility].  Will reconsult psych to further re-evaluate for inpatient psych  * I certify that at the point of admission it is my clinical judgment that the patient will require inpatient hospital care spanning beyond 2 midnights from the point of admission due to high intensity of service, high risk for further deterioration and high frequency of surveillance required.*    Family Communication: None at bedside  Disposition Plan:  Patient currently medically stable, may need discharge to psychiatric inpatient. Needs to be off one-to-one sitter for 24 hours prior to  DC to SNF at [facility]. Psych to further re-evaluate for inpatient psych admission   Consultants:  Psych  Nephrology  Procedures:  VQ scan  Antimicrobials:  Amoxicillin  DVT prophylaxis: Lovenox   Objective: Vitals:   12/25/19 2120 12/25/19 2206 12/26/19 0500 12/26/19 1202  BP: 139/78 (!) 143/78 127/73 133/83  Pulse: 92 84 81 93  Resp:   19 18  Temp: 100.2 F (37.9 C)  98.9 F (37.2 C) 99.1 F (37.3 C)  TempSrc: Oral  Oral Oral  SpO2: 98%  97% 98%  Weight:   77.7 kg   Height:        Intake/Output Summary (Last 24 hours) at 12/26/2019 1601 Last data filed at 12/26/2019 1155 Gross per 24 hour  Intake 600 ml  Output 1050 ml  Net -450 ml   Filed Weights   12/24/19 0500 12/25/19 0626 12/26/19 0500  Weight: 78.3 kg 77 kg 77.7 kg   Body mass index is 26.83 kg/m.  Exam:  General: NAD, restless/agitated/anxious, delusional  Cardiovascular: S1, S2 present  Respiratory: CTAB  Abdomen: Soft, nontender, nondistended, bowel sounds present  Musculoskeletal: No bilateral pedal edema noted  Skin: Normal  Psychiatry: Noted psychosis     Data Reviewed: CBC: Recent Labs  Lab 12/20/19 0941 12/23/19 0307 12/23/19 1201  WBC 11.1* 8.7 8.1  NEUTROABS 8.5*  --   --   HGB 11.9* 12.0 11.4*  HCT 34.9* 37.5 34.2*  MCV 91.1 93.5 91.9  PLT 242 293 [id number]   Basic Metabolic Panel: Recent Labs  Lab 12/20/19 0941 12/21/19 1526 12/22/19 2210 12/23/19 0307 12/25/19 0444  NA 139 141 140 136 139  K 3.7 4.5 4.6 3.9 4.6  CL 112* 112* 114* 110 111  CO2 16* 20* 13* 17* 20*  GLUCOSE 141* 122* 115* 125* 95  BUN 34* 29* 28* 27* 32*  CREATININE 1.44* 1.43* 1.36* 1.24* 1.21*  CALCIUM 9.5 9.4 9.6 9.6 9.1  MG  --  2.1  --   --   --   PHOS  --   --   --   --  3.2   GFR: Estimated Creatinine Clearance: 48.4 mL/min (A) (by C-G formula based on SCr of 1.21 mg/dL (H)). Liver Function Tests: Recent Labs  Lab 12/20/19 0941 12/23/19 0307 12/25/19 0444  AST 19 15  --   ALT 7 7  --     ALKPHOS 30* 36*  --   BILITOT 0.4 1.0  --   PROT 6.6 6.8  --   ALBUMIN 3.3* 3.5 3.1*   No results for input(s): LIPASE, AMYLASE in the last 168 hours. Recent Labs  Lab 12/22/19 2210  AMMONIA 28   Coagulation Profile: Recent Labs  Lab 12/23/19 0416  INR 1.1   Cardiac Enzymes: No results for input(s): CKTOTAL, CKMB, CKMBINDEX, TROPONINI in the last 168 hours. BNP (last 3 results) No results for input(s): PROBNP in the last 8760 hours. HbA1C: No results for input(s): HGBA1C in the last 72 hours. CBG: Recent Labs  Lab 12/23/19 0041  GLUCAP 141*   Lipid Profile: No results for input(s): CHOL, HDL, LDLCALC, TRIG, CHOLHDL, LDLDIRECT in the last 72 hours. Thyroid Function Tests: No results for input(s): TSH, T4TOTAL, FREET4, T3FREE, THYROIDAB in the last 72 hours. Anemia Panel: No results for input(s): VITAMINB12, FOLATE, FERRITIN, TIBC, IRON, RETICCTPCT in the last 72 hours. Urine analysis:    Component Value Date/Time   COLORURINE YELLOW 12/23/2019 1528   APPEARANCEUR HAZY (A) 12/23/2019 1528   LABSPEC  1.010 12/23/2019 1528   PHURINE 5.0 12/23/2019 1528   GLUCOSEU 50 (A) 12/23/2019 1528   HGBUR SMALL (A) 12/23/2019 1528   BILIRUBINUR NEGATIVE 12/23/2019 1528   KETONESUR NEGATIVE 12/23/2019 1528   PROTEINUR NEGATIVE 12/23/2019 1528   NITRITE NEGATIVE 12/23/2019 1528   LEUKOCYTESUR LARGE (A) 12/23/2019 1528   Sepsis Labs: @LABRCNTIP (procalcitonin:4,lacticidven:4)  ) Recent Results (from the past 240 hour(s))  Respiratory Panel by RT PCR (Flu A&B, Covid) - Nasopharyngeal Swab     Status: None   Collection Time: 12/17/19  2:37 PM   Specimen: Nasopharyngeal Swab  Result Value Ref Range Status   SARS Coronavirus 2 by RT PCR NEGATIVE NEGATIVE Final    Comment: (NOTE) SARS-CoV-2 target nucleic acids are NOT DETECTED. The SARS-CoV-2 RNA is generally detectable in upper respiratoy specimens during the acute phase of infection. The lowest concentration of SARS-CoV-2  viral copies this assay can detect is 131 copies/mL. A negative result does not preclude SARS-Cov-2 infection and should not be used as the sole basis for treatment or other patient management decisions. A negative result may occur with  improper specimen collection/handling, submission of specimen other than nasopharyngeal swab, presence of viral mutation(s) within the areas targeted by this assay, and inadequate number of viral copies (<131 copies/mL). A negative result must be combined with clinical observations, patient history, and epidemiological information. The expected result is Negative. Fact Sheet for Patients:  PinkCheek.be Fact Sheet for Healthcare Providers:  GravelBags.it This test is not yet ap proved or cleared by the [place] FDA and  has been authorized for detection and/or diagnosis of SARS-CoV-2 by FDA under an Emergency Use Authorization (EUA). This EUA will remain  in effect (meaning this test can be used) for the duration of the COVID-19 declaration under Section 564(b)(1) of the Act, 21 U.S.C. section 360bbb-3(b)(1), unless the authorization is terminated or revoked sooner.    Influenza A by PCR NEGATIVE NEGATIVE Final   Influenza B by PCR NEGATIVE NEGATIVE Final    Comment: (NOTE) The Xpert Xpress SARS-CoV-2/FLU/RSV assay is intended as an aid in  the diagnosis of influenza from Nasopharyngeal swab specimens and  should not be used as a sole basis for treatment. Nasal washings and  aspirates are unacceptable for Xpert Xpress SARS-CoV-2/FLU/RSV  testing. Fact Sheet for Patients: PinkCheek.be Fact Sheet for Healthcare Providers: GravelBags.it This test is not yet approved or cleared by the [place] FDA and  has been authorized for detection and/or diagnosis of SARS-CoV-2 by  FDA under an Emergency Use Authorization (EUA). This EUA will  remain  in effect (meaning this test can be used) for the duration of the  Covid-19 declaration under Section 564(b)(1) of the Act, 21  U.S.C. section 360bbb-3(b)(1), unless the authorization is  terminated or revoked. Performed at [facility], [place] [address], [place], [place] [address]   Urine culture     Status: None   Collection Time: 12/17/19 10:32 PM   Specimen: Urine, Random  Result Value Ref Range Status   Specimen Description   Final    URINE, RANDOM Performed at [place] [address], [place], [place] [address]    Special Requests   Final    NONE Performed at [facility], [place] [address], [place], [place] [address]    Culture   Final    NO GROWTH Performed at [facility], [place] [address], [place], [place] [address]    Report Status 12/18/2019 FINAL  Final  Respiratory Panel by RT PCR (Flu A&B, Covid) - Nasopharyngeal Swab     Status: None   Collection Time: 12/19/19 10:14 PM   Specimen: Nasopharyngeal Swab  Result Value Ref Range Status   SARS Coronavirus 2 by RT PCR NEGATIVE NEGATIVE Final    Comment: (NOTE) SARS-CoV-2 target nucleic acids are NOT DETECTED. The SARS-CoV-2 RNA is generally detectable in upper respiratoy specimens during the acute phase of infection. The lowest concentration of SARS-CoV-2 viral copies this assay can detect is 131 copies/mL. A negative result does not preclude SARS-Cov-2 infection and should not be used as the sole basis for treatment or other patient management decisions. A negative result may occur with  improper specimen collection/handling, submission of specimen other than nasopharyngeal swab, presence of viral mutation(s) within the areas targeted by this assay, and inadequate number of viral copies (<131 copies/mL). A negative result must be combined with clinical observations, patient history, and epidemiological information. The expected result is  Negative. Fact Sheet for Patients:  PinkCheek.be Fact Sheet for Healthcare Providers:  GravelBags.it This test is not yet ap proved or cleared by the [place] FDA and  has been authorized for detection and/or diagnosis of SARS-CoV-2 by FDA under an Emergency Use Authorization (EUA). This EUA will remain  in effect (meaning this test can be used) for the duration of the COVID-19 declaration under Section 564(b)(1) of the Act, 21 U.S.C. section 360bbb-3(b)(1), unless the authorization is terminated or revoked sooner.    Influenza A by PCR NEGATIVE NEGATIVE Final   Influenza B by PCR NEGATIVE NEGATIVE Final    Comment: (NOTE) The Xpert Xpress SARS-CoV-2/FLU/RSV assay is intended as an aid in  the diagnosis of influenza from Nasopharyngeal swab specimens and  should not be used as a sole basis for treatment. Nasal washings and  aspirates are unacceptable for Xpert Xpress SARS-CoV-2/FLU/RSV  testing. Fact Sheet for Patients: PinkCheek.be Fact Sheet for Healthcare Providers: GravelBags.it This test is not yet approved or cleared by the [place] FDA and  has been authorized for detection and/or diagnosis of SARS-CoV-2 by  FDA under an Emergency Use Authorization (EUA). This EUA will remain  in effect (meaning this test can be used) for the duration of the  Covid-19 declaration under Section 564(b)(1) of the Act, 21  U.S.C. section 360bbb-3(b)(1), unless the authorization is  terminated or revoked. Performed at [facility], [place] [address], [place], [place] [address]   Culture, Urine     Status: Abnormal   Collection Time: 12/23/19  3:28 PM   Specimen: Urine, Clean Catch  Result Value Ref Range Status   Specimen Description URINE, CLEAN CATCH  Final   Special Requests   Final    NONE Performed at [place]  [address], [place], [place] [address]    Culture (A)  Final    60,000 COLONIES/mL MULTIPLE SPECIES PRESENT, SUGGEST RECOLLECTION   Report Status 12/25/2019 FINAL  Final      Studies: No results found.  Scheduled Meds: . amoxicillin  500 mg Oral Q8H  . cycloSPORINE  125 mg Oral q morning - 10a  . cycloSPORINE  150 mg Oral QHS  . divalproex  750 mg Oral QHS  . doxazosin  1 mg Oral QHS  . enoxaparin (LOVENOX) injection  40 mg Subcutaneous Q24H  . levothyroxine  50 mcg Oral Q0600  . LORazepam  0.5 mg Oral TID  . magnesium oxide  400 mg Oral Daily  .  metoprolol tartrate  100 mg Oral BID  . mycophenolate  250 mg Oral BID  . OLANZapine  5 mg Oral QHS  . sodium bicarbonate  50 mEq Intravenous Once  . sodium bicarbonate  1,300 mg Oral BID  . thiamine  100 mg Oral Daily   Or  . thiamine  100 mg Intravenous Daily    Continuous Infusions:    LOS: 3 days     Alma Friendly, MD [facility]  12/26/2019, 4:01 PM

## 2019-12-26 NOTE — Progress Notes (Signed)
[facility] Progress Note   Subjective:   Creat flat at 1.2, UOP 600 cc  Objective Vitals:   12/25/19 2120 12/25/19 2206 12/26/19 0500 12/26/19 1202  BP: 139/78 (!) 143/78 127/73 133/83  Pulse: 92 84 81 93  Resp:   19 18  Temp: 100.2 F (37.9 C)  98.9 F (37.2 C) 99.1 F (37.3 C)  TempSrc: Oral  Oral Oral  SpO2: 98%  97% 98%  Weight:   77.7 kg   Height:       Physical Exam General: chronically ill appearing woman who is comfortable Heart: RRR, 90s Lungs: normal WOB Abdomen: soft, RLQ kidney not palpable or tender Extremities: no edema Neuro/psych: oriented to [place], 2024  Assessment/Plan: **s/p renal transplant: Baseline Cr 1.0 -1.4.  CysA trough goal 150-200 per [facility] chart.  Checking trough CysA true trough drawn 2/8. Cont current meds. Creat stable. No vol excess.   **bipolar DO: having SI; psych consulting  **NAGMA: suspect secondary to CNI. Supplemental bicarb to level 22.  Check tomorrow AM.   **h/o recurrent UTI:  UA 2/6 shows 6-10wbc, few bact and 0-5 rbc, prior with enterococcus, started amoxicillin; cx shows 60,000 col mult species present  **Tachycardia and chest pain: per primary. Trops, TTE. CXR ok. VQ low risk.  Improving  **HTN: ok on home meds.   **bipolar: acute worsening poss secondary to UTI; psych seeing. Has sitter  Will follow, call with questions, concerns.   Kelly Splinter, MD 12/25/2019, 3:32 PM  Additional Objective Labs: Basic Metabolic Panel: Recent Labs  Lab 12/22/19 2210 12/23/19 0307 12/25/19 0444  NA 140 136 139  K 4.6 3.9 4.6  CL 114* 110 111  CO2 13* 17* 20*  GLUCOSE 115* 125* 95  BUN 28* 27* 32*  CREATININE 1.36* 1.24* 1.21*  CALCIUM 9.6 9.6 9.1  PHOS  --   --  3.2   Liver Function Tests: Recent Labs  Lab 12/20/19 0941 12/23/19 0307 12/25/19 0444  AST 19 15  --   ALT 7 7  --   ALKPHOS 30* 36*  --   BILITOT 0.4 1.0  --   PROT 6.6 6.8  --   ALBUMIN 3.3* 3.5 3.1*   No results for  input(s): LIPASE, AMYLASE in the last 168 hours. CBC: Recent Labs  Lab 12/20/19 0941 12/23/19 0307 12/23/19 1201  WBC 11.1* 8.7 8.1  NEUTROABS 8.5*  --   --   HGB 11.9* 12.0 11.4*  HCT 34.9* 37.5 34.2*  MCV 91.1 93.5 91.9  PLT 242 293 291   Blood Culture    Component Value Date/Time   SDES URINE, CLEAN CATCH 12/23/2019 1528   SPECREQUEST  12/23/2019 1528    NONE Performed at [facility], [place] [address], [place], [place] [address]    CULT (A) 12/23/2019 1528    60,000 COLONIES/mL MULTIPLE SPECIES PRESENT, SUGGEST RECOLLECTION   REPTSTATUS 12/25/2019 FINAL 12/23/2019 1528    Cardiac Enzymes: No results for input(s): CKTOTAL, CKMB, CKMBINDEX, TROPONINI in the last 168 hours. CBG: Recent Labs  Lab 12/23/19 0041  GLUCAP 141*   Iron Studies: No results for input(s): IRON, TIBC, TRANSFERRIN, FERRITIN in the last 72 hours. @lablastinr3 @ Studies/Results: No results found. Medications:  . amoxicillin  500 mg Oral Q8H  . cycloSPORINE  125 mg Oral q morning - 10a  . cycloSPORINE  150 mg Oral QHS  . divalproex  750 mg Oral QHS  . doxazosin  1 mg Oral QHS  . enoxaparin (LOVENOX)  injection  40 mg Subcutaneous Q24H  . levothyroxine  50 mcg Oral Q0600  . LORazepam  0.5 mg Oral BID  . magnesium oxide  400 mg Oral Daily  . metoprolol tartrate  100 mg Oral BID  . mycophenolate  250 mg Oral BID  . OLANZapine  5 mg Oral QHS  . sodium bicarbonate  50 mEq Intravenous Once  . sodium bicarbonate  1,300 mg Oral BID  . thiamine  100 mg Oral Daily   Or  . thiamine  100 mg Intravenous Daily

## 2019-12-27 ENCOUNTER — Encounter (HOSPITAL_COMMUNITY): Payer: Self-pay | Admitting: Internal Medicine

## 2019-12-27 DIAGNOSIS — N183 Chronic kidney disease, stage 3 unspecified: Secondary | ICD-10-CM

## 2019-12-27 LAB — BASIC METABOLIC PANEL
Anion gap: 8 (ref 5–15)
BUN: 32 mg/dL — ABNORMAL HIGH (ref 8–23)
CO2: 19 mmol/L — ABNORMAL LOW (ref 22–32)
Calcium: 9 mg/dL (ref 8.9–10.3)
Chloride: 112 mmol/L — ABNORMAL HIGH (ref 98–111)
Creatinine, Ser: 1.3 mg/dL — ABNORMAL HIGH (ref 0.44–1.00)
GFR calc Af Amer: 49 mL/min — ABNORMAL LOW (ref >60)
GFR calc non Af Amer: 42 mL/min — ABNORMAL LOW (ref >60)
Glucose, Bld: 91 mg/dL (ref 70–99)
Potassium: 4.1 mmol/L (ref 3.5–5.1)
Sodium: 139 mmol/L (ref 135–145)

## 2019-12-27 LAB — CYCLOSPORINE: Cyclosporine, LabCorp: 128 ng/mL (ref 100–400)

## 2019-12-27 MED ORDER — LIP MEDEX EX OINT
TOPICAL_OINTMENT | CUTANEOUS | Status: DC | PRN
  Filled 2019-12-27: qty 7

## 2019-12-27 NOTE — Progress Notes (Signed)
[facility] Progress Note   Subjective:   Creat 1.3, good UOP yest, seen by psych pt is improving  Objective Vitals:   12/26/19 2120 12/27/19 0604 12/27/19 1022 12/27/19 1247  BP: (!) 155/94 129/73 122/64 125/76  Pulse: 91 79 84 74  Resp: 20 18  20   Temp: 98.6 F (37 C) 98.4 F (36.9 C)  98 F (36.7 C)  TempSrc: Oral Oral  Oral  SpO2: 100% 99%  99%  Weight:  79.1 kg    Height:       Physical Exam General: chronically ill appearing woman who is comfortable Heart: RRR, 90s Lungs: normal WOB Abdomen: soft, RLQ kidney not palpable or tender Extremities: no edema Neuro/psych: oriented to [place] place, 2024  Assessment/Plan: **s/p renal transplant: Baseline Cr 1.0 -1.4.  CysA trough goal 150-200 per [facility] chart.  CysA trough drawn 2/8 showed level 128, no new med changes.  Creat stable. No vol excess. No new suggestions, will sign off. Pt will f/u w/ [facility] renal after dc.   **bipolar DO: having SI; psych consulting  **NAGMA: suspect secondary to CNI. Supplemental bicarb to level 22.  Check tomorrow AM.   **h/o recurrent UTI:  UA 2/6 shows 6-10wbc, few bact and 0-5 rbc, prior with enterococcus, started amoxicillin; cx shows 60,000 col mult species present  **Tachycardia and chest pain: per primary. Trops, TTE. CXR ok. VQ low risk.  Improving  **HTN: ok on home meds.   **bipolar: acute worsening poss secondary to UTI; psych seeing. Has sitter  Will follow, call with questions, concerns.   Kelly Splinter, MD 12/25/2019, 3:32 PM  Additional Objective Labs: Basic Metabolic Panel: Recent Labs  Lab 12/23/19 0307 12/25/19 0444 12/27/19 0245  NA 136 139 139  K 3.9 4.6 4.1  CL 110 111 112*  CO2 17* 20* 19*  GLUCOSE 125* 95 91  BUN 27* 32* 32*  CREATININE 1.24* 1.21* 1.30*  CALCIUM 9.6 9.1 9.0  PHOS  --  3.2  --    Liver Function Tests: Recent Labs  Lab 12/23/19 0307 12/25/19 0444  AST 15  --   ALT 7  --   ALKPHOS 36*  --   BILITOT 1.0  --   PROT  6.8  --   ALBUMIN 3.5 3.1*   No results for input(s): LIPASE, AMYLASE in the last 168 hours. CBC: Recent Labs  Lab 12/23/19 0307 12/23/19 1201  WBC 8.7 8.1  HGB 12.0 11.4*  HCT 37.5 34.2*  MCV 93.5 91.9  PLT 293 291   Blood Culture    Component Value Date/Time   SDES URINE, CLEAN CATCH 12/23/2019 1528   SPECREQUEST  12/23/2019 1528    NONE Performed at [facility], [place] [address], [place], [place] [address]    CULT (A) 12/23/2019 1528    60,000 COLONIES/mL MULTIPLE SPECIES PRESENT, SUGGEST RECOLLECTION   REPTSTATUS 12/25/2019 FINAL 12/23/2019 1528    Cardiac Enzymes: No results for input(s): CKTOTAL, CKMB, CKMBINDEX, TROPONINI in the last 168 hours. CBG: Recent Labs  Lab 12/23/19 0041  GLUCAP 141*   Iron Studies: No results for input(s): IRON, TIBC, TRANSFERRIN, FERRITIN in the last 72 hours. @lablastinr3 @ Studies/Results: No results found. Medications:  . amoxicillin  500 mg Oral Q8H  . cycloSPORINE  125 mg Oral q morning - 10a  . cycloSPORINE  150 mg Oral QHS  . divalproex  750 mg Oral QHS  . doxazosin  1 mg Oral QHS  . enoxaparin (LOVENOX) injection  40 mg  Subcutaneous Q24H  . levothyroxine  50 mcg Oral Q0600  . LORazepam  0.5 mg Oral TID  . magnesium oxide  400 mg Oral Daily  . metoprolol tartrate  100 mg Oral BID  . mycophenolate  250 mg Oral BID  . OLANZapine  5 mg Oral QHS  . sodium bicarbonate  1,300 mg Oral BID  . thiamine  100 mg Oral Daily   Or  . thiamine  100 mg Intravenous Daily

## 2019-12-27 NOTE — Plan of Care (Signed)
Pt stated "I don't want to commit suicide."  At times she is able to carry on a somewhat normal conversation by answering questions appropriately. At other times she speaks randomly and what she says makes no sense. She was cooperative this shift by taking her medicine. There was no shouting or agitation this shift.

## 2019-12-27 NOTE — Evaluation (Signed)
Physical Therapy Evaluation Patient Details Name: Brenda Nolan MRN: [id number] DOB: Nov 02, 1951 Today's Date: 12/27/2019   History of Present Illness  This 69 year old female was admitted from [place], where she was receiving rehab.  She presented with tachycardia and CP.  PMH:  bipolar, anxiety, difficulty walking, cdiff, and AMS    Clinical Impression  Brenda Nolan is 69 y.o. female admitted with above HPI and diagnosis. Patient is currently limited by functional impairments below (see PT problem list). Per chart patient lives at [facility] and was admitted from [place] where she was completing short rehab stay. Unclear on pt's baseline mobility level. Patient will benefit from continued skilled PT interventions to address impairments and progress independence with mobility, recommending SNF. Acute PT will follow and progress as able.     Follow Up Recommendations SNF;Supervision/Assistance - 24 hour    Equipment Recommendations  None recommended by PT    Recommendations for Other Services       Precautions / Restrictions Precautions Precautions: Fall Precaution Comments: multiple falls Restrictions Weight Bearing Restrictions: No      Mobility  Bed Mobility Overal bed mobility: Needs Assistance Bed Mobility: Supine to Sit     Supine to sit: Min assist;HOB elevated Sit to supine: Min assist;Mod assist   General bed mobility comments: verbal/tactile cues for sequencing reach and hand placement on bed rail, assist to brign LE's off EOB smoothly and to initiate press up to sit EOB  Transfers Overall transfer level: Needs assistance Equipment used: Rolling walker (2 wheeled) Transfers: Sit to/from Stand Sit to Stand: Min assist;From elevated surface;Mod assist         General transfer comment: cues for safe hand placement and technique with RW, mod assist on 1st sit<>stand and then min assist on 2nd with elevated surface. pt shaking upon rising for first  stand initially, cues for pursed lip breathing and pt able to relax and tremor decreased.  Ambulation/Gait Ambulation/Gait assistance: Min assist Gait Distance (Feet): 9 Feet Assistive device: Rolling walker (2 wheeled) Gait Pattern/deviations: Narrow base of support;Step-through pattern Gait velocity: decreased and unsteady   General Gait Details: pt ambulated short distance in hospital room with min assist to steady and manage RW, no overt LOB  Stairs            Wheelchair Mobility    Modified Rankin (Stroke Patients Only)       Balance Overall balance assessment: Needs assistance Sitting-balance support: Feet supported Sitting balance-Leahy Scale: Good Sitting balance - Comments: at least fair   Standing balance support: During functional activity;Bilateral upper extremity supported Standing balance-Leahy Scale: Fair Standing balance comment: pt able to stand without support to help doff soiled breif and don new one but required 75-100% assist to complete task safely              Pertinent Vitals/Pain Pain Assessment: 0-10 Pain Score: 8  Pain Location: "my kidneys" (no signs of pain with mobility) Pain Descriptors / Indicators: Discomfort("hurt") Pain Intervention(s): Monitored during session    Home Living Family/patient expects to be discharged to:: Skilled nursing facility                 Additional Comments: Pt admitted from [place] where she was participating in rehab for a short while. She normally resides at [facility].  Pt reports that she has assist for all adls but is contradictory stating someone helps with her getting out of bed and then that she does not need  assist for getting around or bathing/dressing.    Prior Function Level of Independence: Needs assistance   Gait / Transfers Assistance Needed: per OT note pt reportedly fell out of WC. Pt able to use RW for mobility during session. She may use WC and RW for mobility at  baseline.  ADL's / Homemaking Assistance Needed: pt is questionable/poor hisptorian; likely requires assist at ALF/SNF  Comments: unsure of amount of assistance needed at baseline     Hand Dominance   Dominant Hand: Right    Extremity/Trunk Assessment   Upper Extremity Assessment Upper Extremity Assessment: Defer to OT evaluation    Lower Extremity Assessment Lower Extremity Assessment: Overall WFL for tasks assessed    Cervical / Trunk Assessment Cervical / Trunk Assessment: Normal  Communication   Communication: No difficulties  Cognition Arousal/Alertness: Awake/alert Behavior During Therapy: WFL for tasks assessed/performed Overall Cognitive Status: No family/caregiver present to determine baseline cognitive functioning Area of Impairment: Attention;Problem solving;Following commands        Current Attention Level: Sustained   Following Commands: Follows one step commands with increased time;Follows multi-step commands with increased time;Follows one step commands consistently     Problem Solving: Slow processing;Decreased initiation;Difficulty sequencing;Requires verbal cues General Comments: unsure about PLOF/information provided      General Comments General comments (skin integrity, edema, etc.): BP after returning to bed with HOB 20, 132/71    Exercises     Assessment/Plan    PT Assessment Patient needs continued PT services  PT Problem List Decreased mobility;Decreased safety awareness;Decreased coordination;Decreased activity tolerance;Decreased cognition;Decreased balance;Decreased knowledge of use of DME;Decreased strength       PT Treatment Interventions DME instruction;Therapeutic exercise;Gait training;Wheelchair mobility training;Balance training;Functional mobility training;Cognitive remediation;Therapeutic activities;Patient/family education    PT Goals (Current goals can be found in the Care Plan section)  Acute Rehab PT Goals Patient  Stated Goal: none stated PT Goal Formulation: Patient unable to participate in goal setting Time For Goal Achievement: 01/10/20 Potential to Achieve Goals: Good    Frequency Min 2X/week    AM-PAC PT "6 Clicks" Mobility  Outcome Measure Help needed turning from your back to your side while in a flat bed without using bedrails?: A Little Help needed moving from lying on your back to sitting on the side of a flat bed without using bedrails?: A Little Help needed moving to and from a bed to a chair (including a wheelchair)?: A Little Help needed standing up from a chair using your arms (e.g., wheelchair or bedside chair)?: A Little Help needed to walk in hospital room?: A Little Help needed climbing 3-5 steps with a railing? : A Lot 6 Click Score: 17    End of Session Equipment Utilized During Treatment: Gait belt Activity Tolerance: Patient tolerated treatment well Patient left: with call bell/phone within reach;in chair;with chair alarm set Nurse Communication: Mobility status PT Visit Diagnosis: Difficulty in walking, not elsewhere classified (R26.2);Unsteadiness on feet (R26.81);History of falling (Z91.81)    Time: [id number] PT Time Calculation (min) (ACUTE ONLY): 31 min   Charges:   PT Evaluation $PT Eval Low Complexity: 1 Low PT Treatments $Therapeutic Activity: 8-22 mins        Verner Mould, DPT Physical Therapist with [facility] [phone]  12/27/2019 5:18 PM

## 2019-12-27 NOTE — Evaluation (Signed)
Occupational Therapy Evaluation Patient Details Name: Brenda Nolan MRN: [id number] DOB: [birth year] Today's Date: 12/27/2019    History of Present Illness This 69 year old female was admitted from [place], where she was receiving rehab.  She presented with tachycardia and CP.  PMH:  bipolar, anxiety, difficulty walking, cdiff, and AMS   Clinical Impression   Pt was admitted for the above. Per chart, she had only been at [facility] for rehab for a short while. She had been at [facility] prior to that, and the plan is to return there after rehab.  Unsure of PLOF; pt is not a good historian, based on previous notes.  She needs min A for grooming and she does have a small tremor.  She needs mod for UB adls and total A for LB. Will follow in acute setting with min A level goals.     Follow Up Recommendations  SNF    Equipment Recommendations  (defer to next venue)    Recommendations for Other Services       Precautions / Restrictions Precautions Precautions: Fall Precaution Comments: multiple falls Restrictions Weight Bearing Restrictions: No      Mobility Bed Mobility         Supine to sit: Mod assist Sit to supine: Min assist;Mod assist   General bed mobility comments: mod A for trunk to sit up; encouraged pt to push up using her arms. She tends to reach for therapist and try to pull herself up.  guided trunk and assist for legs for back to bed  Transfers                 General transfer comment: did not attempt    Balance       Sitting balance - Comments: at least fair                                   ADL either performed or assessed with clinical judgement   ADL Overall ADL's : Needs assistance/impaired Eating/Feeding: Minimal assistance Eating/Feeding Details (indicate cue type and reason): per NT, sitter assisted with feeding today Grooming: Oral care;Brushing hair Grooming Details (indicate cue type and reason): therapist  applied toothpaste Upper Body Bathing: Moderate assistance   Lower Body Bathing: Total assistance   Upper Body Dressing : Moderate assistance   Lower Body Dressing: Total assistance                 General ADL Comments: pt sat eob x 2. Initially she felt like she was going to pass out. When repositioned, BP wfls.  Attempted a little later and tolerated sitting about 3 minutes     Vision         Perception     Praxis      Pertinent Vitals/Pain Pain Assessment: No/denies pain     Hand Dominance Right   Extremity/Trunk Assessment Upper Extremity Assessment Upper Extremity Assessment: Overall WFL for tasks assessed(has slight tremor)           Communication Communication Communication: No difficulties   Cognition Arousal/Alertness: Awake/alert Behavior During Therapy: WFL for tasks assessed/performed Overall Cognitive Status: No family/caregiver present to determine baseline cognitive functioning                     Current Attention Level: Sustained           General Comments: unsure about PLOF/information provided  General Comments  BP after returning to bed with HOB 20, 132/71    Exercises     Shoulder Instructions      Home Living Family/patient expects to be discharged to:: Skilled nursing facility                                 Additional Comments: admitted last month x 2 with falls from w/c. Per chart, she was at [facility] for rehab for a short while. She normally resides at [facility].  Pt reports that she has assist for all adls      Prior Functioning/Environment          Comments: unsure of amount of assistance needed at baseline        OT Problem List: Decreased strength;Decreased activity tolerance;Decreased cognition;Decreased coordination;Decreased knowledge of use of DME or AE(standing balance NT)      OT Treatment/Interventions: Self-care/ADL training;DME and/or AE  instruction;Patient/family education;Therapeutic activities;Cognitive remediation/compensation    OT Goals(Current goals can be found in the care plan section) Acute Rehab OT Goals Patient Stated Goal: none stated; agreeable to working with OT OT Goal Formulation: With patient Time For Goal Achievement: 01/10/20 Potential to Achieve Goals: Fair ADL Goals Pt Will Transfer to Toilet: with min assist;bedside commode;stand pivot transfer Additional ADL Goal #1: pt will perform sit to stand with min A and RW and maintain for 2 minutes at this level for adls Additional ADL Goal #2: pt will tolerate sitting eob while performing UB adls and grooming with min A for set up  OT Frequency: Min 2X/week   Barriers to D/C:            Co-evaluation              AM-PAC OT "6 Clicks" Daily Activity     Outcome Measure Help from another person eating meals?: A Little Help from another person taking care of personal grooming?: A Little Help from another person toileting, which includes using toliet, bedpan, or urinal?: Total Help from another person bathing (including washing, rinsing, drying)?: A Lot Help from another person to put on and taking off regular upper body clothing?: A Lot Help from another person to put on and taking off regular lower body clothing?: Total 6 Click Score: 12   End of Session    Activity Tolerance: Patient limited by fatigue Patient left: in bed;with call bell/phone within reach;with bed alarm set  OT Visit Diagnosis: Muscle weakness (generalized) (M62.81)                Time: [id number] OT Time Calculation (min): 16 min Charges:  OT General Charges $OT Visit: 1 Visit OT Evaluation $OT Eval Low Complexity: 1 Low  Dereck Agerton S, OTR/L Acute Rehabilitation Services 12/27/2019  [place] 12/27/2019, 3:25 PM

## 2019-12-27 NOTE — Progress Notes (Signed)
PROGRESS NOTE  Brenda Nolan [id number] DOB: 1951/07/15 DOA: 12/17/2019 PCP: Patient, No Pcp Per   LOS: 4 days   Brief narrative: As per HPI,  Brenda Nolan a66 y.o.female,anxiety, bipolar 1 disorders, stage IIIb chronic kidney disease, unspecified difficulty walking history, oropharyngeal phase dysphagia, history of C. difficile colitis,with recent admission 11/29/19 for AMS secondary to UTI, in psych ED since 1/31. Patient was admitted with altered mental status and psychotic features with history of bipolar possibly decompensation.  Psychiatry consulted. Patient was waiting to be transferred back to [place], per social worker they would take her on if there is no medical issues or if she no longer has one-on-one sitter for 24 hours. Pt continued to voice suicidal ideation and has one-on-one sitter for safety. Currently being treated for possible UTI.  Assessment/Plan:  Principal Problem:   Bipolar 1 disorder, depressed (HCC) Active Problems:   Tachycardia  Tachycardia with positive D-dimer/chest pain Improved.  VQ scan was negative for PE.  Unable to do CT scan due to single kidney kidney transplant and CKD. 2D Echo showed EF of 65 to [id number], grade 1 diastolic dysfunction, no regional wall motion abnormality.off heparin drip.  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  Abnormal urinalysis but urine culture was positive.  Nephrology recommended amoxicillin will continue.  CKD stage III, History of renal transplant Nephrology on board.  Continue cyclosporine, CellCept.  Creatinine at baseline at this time.   Non-anion gap metabolic acidosis Resolved.  On bicarb tablets.  Hypertension Continue metoprolol, hold amlodipine.  Blood pressure seems to be stable at this time.  Hypothyroidism Continue Synthroid  Bipolar disorder with psychotic features With expression of suicidal ideation.  On one-to-one sitter.  Continue Depakote for depression.   Psychiatry has been reconsulted and recommend to continue current psychotropic medication.  No inpatient psychiatric hospitalization planned.   VTE Prophylaxis: Lovenox  Code Status: Code  Family Communication: None  Disposition Plan:  . Patient is from uncertain . Likely disposition to skilled nursing facility . Barriers to discharge: PT OT pending, possible skilled nursing facility placement   Consultants:  Psych  Nephrology  Procedures:  VQ scan  Antibiotics:   Amoxicillin  Anti-infectives (From admission, onward)   Start     Dose/Rate Route Frequency Ordered Stop   12/24/19 1300  amoxicillin (AMOXIL) capsule 500 mg     500 mg Oral Every 8 hours 12/24/19 1245 01/03/20 2359   12/24/19 1200  cephALEXin (KEFLEX) capsule 250 mg  Status:  Discontinued     250 mg Oral Every 6 hours 12/24/19 0917 12/24/19 1245     Subjective: Today, patient was seen and examined at bedside.  Patient denies chest pain, shortness of breath, fever or chills.  Objective: Vitals:   12/27/19 0604 12/27/19 1022  BP: 129/73 122/64  Pulse: 79 84  Resp: 18   Temp: 98.4 F (36.9 C)   SpO2: 99%     Intake/Output Summary (Last 24 hours) at 12/27/2019 1213 Last data filed at 12/27/2019 0900 Gross per 24 hour  Intake 600 ml  Output 3000 ml  Net -2400 ml   Filed Weights   12/25/19 0626 12/26/19 0500 12/27/19 0604  Weight: 77 kg 77.7 kg 79.1 kg   Body mass index is 27.31 kg/m.   Physical Exam: GENERAL: Patient is alert awake and communicative.  Not in obvious distress. HENT: No scleral pallor or icterus. Pupils equally reactive to light. Oral mucosa is moist NECK: is supple, no gross swelling noted. CHEST: Clear  to auscultation. No crackles or wheezes.  Diminished breath sounds bilaterally. CVS: S1 and S2 heard, no murmur. Regular rate and rhythm.  ABDOMEN: Soft, non-tender, bowel sounds are present. EXTREMITIES: No edema. CNS: Cranial nerves are intact.  Moving all extremities  communicative. SKIN: warm and dry without rashes.  Data Review: I have personally reviewed the following laboratory data and studies,  CBC: Recent Labs  Lab 12/23/19 0307 12/23/19 1201  WBC 8.7 8.1  HGB 12.0 11.4*  HCT 37.5 34.2*  MCV 93.5 91.9  PLT 293 [id number]   Basic Metabolic Panel: Recent Labs  Lab 12/21/19 1526 12/22/19 2210 12/23/19 0307 12/25/19 0444 12/27/19 0245  NA 141 140 136 139 139  K 4.5 4.6 3.9 4.6 4.1  CL 112* 114* 110 111 112*  CO2 20* 13* 17* 20* 19*  GLUCOSE 122* 115* 125* 95 91  BUN 29* 28* 27* 32* 32*  CREATININE 1.43* 1.36* 1.24* 1.21* 1.30*  CALCIUM 9.4 9.6 9.6 9.1 9.0  MG 2.1  --   --   --   --   PHOS  --   --   --  3.2  --    Liver Function Tests: Recent Labs  Lab 12/23/19 0307 12/25/19 0444  AST 15  --   ALT 7  --   ALKPHOS 36*  --   BILITOT 1.0  --   PROT 6.8  --   ALBUMIN 3.5 3.1*   No results for input(s): LIPASE, AMYLASE in the last 168 hours. Recent Labs  Lab 12/22/19 2210  AMMONIA 28   Cardiac Enzymes: No results for input(s): CKTOTAL, CKMB, CKMBINDEX, TROPONINI in the last 168 hours. BNP (last 3 results) No results for input(s): BNP in the last 8760 hours.  ProBNP (last 3 results) No results for input(s): PROBNP in the last 8760 hours.  CBG: Recent Labs  Lab 12/23/19 0041  GLUCAP 141*   Recent Results (from the past 240 hour(s))  Respiratory Panel by RT PCR (Flu A&B, Covid) - Nasopharyngeal Swab     Status: None   Collection Time: 12/17/19  2:37 PM   Specimen: Nasopharyngeal Swab  Result Value Ref Range Status   SARS Coronavirus 2 by RT PCR NEGATIVE NEGATIVE Final    Comment: (NOTE) SARS-CoV-2 target nucleic acids are NOT DETECTED. The SARS-CoV-2 RNA is generally detectable in upper respiratoy specimens during the acute phase of infection. The lowest concentration of SARS-CoV-2 viral copies this assay can detect is 131 copies/mL. A negative result does not preclude SARS-Cov-2 infection and should not be used  as the sole basis for treatment or other patient management decisions. A negative result may occur with  improper specimen collection/handling, submission of specimen other than nasopharyngeal swab, presence of viral mutation(s) within the areas targeted by this assay, and inadequate number of viral copies (<131 copies/mL). A negative result must be combined with clinical observations, patient history, and epidemiological information. The expected result is Negative. Fact Sheet for Patients:  PinkCheek.be Fact Sheet for Healthcare Providers:  GravelBags.it This test is not yet ap proved or cleared by the [place] FDA and  has been authorized for detection and/or diagnosis of SARS-CoV-2 by FDA under an Emergency Use Authorization (EUA). This EUA will remain  in effect (meaning this test can be used) for the duration of the COVID-19 declaration under Section 564(b)(1) of the Act, 21 U.S.C. section 360bbb-3(b)(1), unless the authorization is terminated or revoked sooner.    Influenza A by PCR NEGATIVE NEGATIVE Final   Influenza  B by PCR NEGATIVE NEGATIVE Final    Comment: (NOTE) The Xpert Xpress SARS-CoV-2/FLU/RSV assay is intended as an aid in  the diagnosis of influenza from Nasopharyngeal swab specimens and  should not be used as a sole basis for treatment. Nasal washings and  aspirates are unacceptable for Xpert Xpress SARS-CoV-2/FLU/RSV  testing. Fact Sheet for Patients: PinkCheek.be Fact Sheet for Healthcare Providers: GravelBags.it This test is not yet approved or cleared by the [place] FDA and  has been authorized for detection and/or diagnosis of SARS-CoV-2 by  FDA under an Emergency Use Authorization (EUA). This EUA will remain  in effect (meaning this test can be used) for the duration of the  Covid-19 declaration under Section 564(b)(1) of the Act,  21  U.S.C. section 360bbb-3(b)(1), unless the authorization is  terminated or revoked. Performed at [facility], [place] [address] [place], [place] [address]   Urine culture     Status: None   Collection Time: 12/17/19 10:32 PM   Specimen: Urine, Random  Result Value Ref Range Status   Specimen Description   Final    URINE, RANDOM Performed at [facility] [address], [place], [place] [address]    Special Requests   Final    NONE Performed at [facility], [place] [address] [place], [place] [address]    Culture   Final    NO GROWTH Performed at [facility], [place] [address], [place],  [address]    Report Status 12/18/2019 FINAL  Final  Respiratory Panel by RT PCR (Flu A&B, Covid) - Nasopharyngeal Swab     Status: None   Collection Time: 12/19/19 10:14 PM   Specimen: Nasopharyngeal Swab  Result Value Ref Range Status   SARS Coronavirus 2 by RT PCR NEGATIVE NEGATIVE Final    Comment: (NOTE) SARS-CoV-2 target nucleic acids are NOT DETECTED. The SARS-CoV-2 RNA is generally detectable in upper respiratoy specimens during the acute phase of infection. The lowest concentration of SARS-CoV-2 viral copies this assay can detect is 131 copies/mL. A negative result does not preclude SARS-Cov-2 infection and should not be used as the sole basis for treatment or other patient management decisions. A negative result may occur with  improper specimen collection/handling, submission of specimen other than nasopharyngeal swab, presence of viral mutation(s) within the areas targeted by this assay, and inadequate number of viral copies (<131 copies/mL). A negative result must be combined with clinical observations, patient history, and epidemiological information. The expected result is Negative. Fact Sheet for Patients:  PinkCheek.be Fact Sheet for Healthcare Providers:    GravelBags.it This test is not yet ap proved or cleared by the [place] FDA and  has been authorized for detection and/or diagnosis of SARS-CoV-2 by FDA under an Emergency Use Authorization (EUA). This EUA will remain  in effect (meaning this test can be used) for the duration of the COVID-19 declaration under Section 564(b)(1) of the Act, 21 U.S.C. section 360bbb-3(b)(1), unless the authorization is terminated or revoked sooner.    Influenza A by PCR NEGATIVE NEGATIVE Final   Influenza B by PCR NEGATIVE NEGATIVE Final    Comment: (NOTE) The Xpert Xpress SARS-CoV-2/FLU/RSV assay is intended as an aid in  the diagnosis of influenza from Nasopharyngeal swab specimens and  should not be used as a sole basis for treatment. Nasal washings and  aspirates are unacceptable for Xpert Xpress SARS-CoV-2/FLU/RSV  testing. Fact Sheet for Patients: PinkCheek.be Fact Sheet for Healthcare Providers: GravelBags.it  This test is not yet approved or cleared by the [place] and  has been authorized for detection and/or diagnosis of SARS-CoV-2 by  FDA under an Emergency Use Authorization (EUA). This EUA will remain  in effect (meaning this test can be used) for the duration of the  Covid-19 declaration under Section 564(b)(1) of the Act, 21  U.S.C. section 360bbb-3(b)(1), unless the authorization is  terminated or revoked. Performed at [facility], [place] [address] [place], [place] [address]   Culture, Urine     Status: Abnormal   Collection Time: 12/23/19  3:28 PM   Specimen: Urine, Clean Catch  Result Value Ref Range Status   Specimen Description URINE, CLEAN CATCH  Final   Special Requests   Final    NONE Performed at [facility] [address], [place], [place] [address]    Culture (A)  Final    60,000 COLONIES/mL MULTIPLE SPECIES PRESENT, SUGGEST  RECOLLECTION   Report Status 12/25/2019 FINAL  Final     Studies: No results found.    Flora Lipps, MD  [facility] 12/27/2019

## 2019-12-27 NOTE — Consult Note (Signed)
[facility] Psych Follow Up Progress Note  12/27/2019 12:43 PM Brenda Nolan  MRN:  [id number]   Subjective:  Patient reports that she is doing good.    Tele psych Assessment:  Brenda Nolan, 69 y.o., female patient seen via tele psych by this provider, consulted with Dr. Dwyane Dee; and chart reviewed on 12/27/19.  On evaluation Brenda Nolan reports she is doing good.  Patient states that she is eating and sleeping good, tolerating her medications without adverse reaction.  Patient denies suicidal/self-harm/homicidal ideation, psychosis, and paranoia.  Patient was able to inform that she was at [facility], current month, although the date 10 th was incorrect; but patient has been in hospital for 10 days with altered mental status.  Patient also gave the correct year, president, and her date of birth.  Patient states that she spoke with her son yesterday.  States that she will be retuning to an assisted living facility once she is discharged from the hospital and that she requires assistance with he ADL's at this time. During evaluation Brenda Nolan is alert/oriented x 3; calm/cooperative; and mood congruent with affect.  She does not appear to be responding to internal/external stimuli or delusional thoughts.  Patient denies suicidal/self-harm/homicidal ideation, psychosis, and paranoia.  Patient answered question appropriately.  Patient much better with questioning and response than last assessment with this provider.  Patient psychiatrically cleared.  Will refer to social work to assist patient in getting back to assisted or living or nursing facility once the patient has been medically cleared.       Principal Problem: Bipolar 1 disorder, depressed ([place]) Diagnosis:  Principal Problem:   Bipolar 1 disorder, depressed ([place]) Active Problems:   Tachycardia  Total Time spent with patient: 30 minutes  Past Psychiatric History: Bipolar disorder  Past Medical History:  Past Medical History:  Diagnosis  Date  . Anxiety 2019  . Bipolar 1 disorder ([place]) 2019  . Chronic kidney disease (CKD)    Stage 1  . Difficulty in walking, not elsewhere classified   . Dysphagia, oropharyngeal phase   . Enterocolitis due to Clostridioides difficile 2019  . Hypothyroidism 2019  . Kidney transplant status   . Muscle weakness   . Urinary tract infection    History reviewed. No pertinent surgical history. Family History: History reviewed. No pertinent family history. Family Psychiatric  History: Unaware Social History:  Social History   Substance and Sexual Activity  Alcohol Use Not Currently     Social History   Substance and Sexual Activity  Drug Use Never    Social History   Socioeconomic History  . Marital status: Married    Spouse name: Not on file  . Number of children: Not on file  . Years of education: Not on file  . Highest education level: Not on file  Occupational History  . Occupation: Retired  Tobacco Use  . Smoking status: Never Smoker  . Smokeless tobacco: Never Used  Substance and Sexual Activity  . Alcohol use: Not Currently  . Drug use: Never  . Sexual activity: Not Currently  Other Topics Concern  . Not on file  Social History Narrative   Pt currently resides at [facility] where she is being treated for an infection.  Pt receives outpatient psychiatric services through a provider at [facility].   Social Determinants of Health   Financial Resource Strain:   . Difficulty of Paying Living Expenses: Not on file  Food Insecurity:   . Worried About [employer] of  Food in the Last Year: Not on file  . Ran Out of Food in the Last Year: Not on file  Transportation Needs:   . Lack of Transportation (Medical): Not on file  . Lack of Transportation (Non-Medical): Not on file  Physical Activity:   . Days of Exercise per Week: Not on file  . Minutes of Exercise per Session: Not on file  Stress:   . Feeling of Stress : Not on file  Social Connections:   . Frequency of  Communication with Friends and Family: Not on file  . Frequency of Social Gatherings with Friends and Family: Not on file  . Attends Religious Services: Not on file  . Active Member of Clubs or Organizations: Not on file  . Attends Archivist Meetings: Not on file  . Marital Status: Not on file    Sleep: Good  Appetite:  Good  Current Medications: Current Facility-Administered Medications  Medication Dose Route Frequency Provider Last Rate Last Admin  . acetaminophen (TYLENOL) tablet 650 mg  650 mg Oral Q6H PRN Jani Gravel, MD   650 mg at 12/25/19 2151   Or  . acetaminophen (TYLENOL) suppository 650 mg  650 mg Rectal Q6H PRN Jani Gravel, MD      . amoxicillin (AMOXIL) capsule 500 mg  500 mg Oral Q8H Cristal Deer, MD   500 mg at 12/27/19 0501  . cycloSPORINE (SANDIMMUNE) capsule 125 mg  125 mg Oral q morning - 10a Couture, Cortni S, PA-C   125 mg at 12/27/19 1027  . cycloSPORINE (SANDIMMUNE) capsule 150 mg  150 mg Oral QHS Couture, Cortni S, PA-C   150 mg at 12/26/19 2145  . divalproex (DEPAKOTE) DR tablet 750 mg  750 mg Oral QHS Brexton Sofia B, NP   750 mg at 12/26/19 2146  . doxazosin (CARDURA) tablet 1 mg  1 mg Oral QHS Couture, Cortni S, PA-C   1 mg at 12/26/19 2146  . enoxaparin (LOVENOX) injection 40 mg  40 mg Subcutaneous Q24H Cristal Deer, MD   40 mg at 12/26/19 2144  . levothyroxine (SYNTHROID) tablet 50 mcg  50 mcg Oral Q0600 Couture, Cortni S, PA-C   50 mcg at 12/27/19 0501  . lip balm (CARMEX) ointment   Topical PRN Pokhrel, Laxman, MD      . LORazepam (ATIVAN) tablet 0.5 mg  0.5 mg Oral TID Alma Friendly, MD   0.5 mg at 12/27/19 1028  . magnesium oxide (MAG-OX) tablet 400 mg  400 mg Oral Daily Couture, Cortni S, PA-C   400 mg at 12/27/19 1026  . metoprolol tartrate (LOPRESSOR) tablet 100 mg  100 mg Oral BID Couture, Cortni S, PA-C   100 mg at 12/27/19 1022  . mycophenolate (CELLCEPT) capsule 250 mg  250 mg Oral BID Couture, Cortni S, PA-C   250 mg at  12/27/19 1025  . OLANZapine (ZYPREXA) tablet 5 mg  5 mg Oral QHS Couture, Cortni S, PA-C   5 mg at 12/26/19 2146  . sodium bicarbonate tablet 1,300 mg  1,300 mg Oral BID Justin Mend, MD   1,300 mg at 12/27/19 1026  . thiamine tablet 100 mg  100 mg Oral Daily Couture, Cortni S, PA-C   100 mg at 12/27/19 1028   Or  . thiamine (B-1) injection 100 mg  100 mg Intravenous Daily Couture, Cortni S, PA-C   100 mg at 12/20/19 [id number]    Lab Results:  Results for orders placed or performed during the hospital  encounter of 12/17/19 (from the past 48 hour(s))  Basic metabolic panel     Status: Abnormal   Collection Time: 12/27/19  2:45 AM  Result Value Ref Range   Sodium 139 135 - 145 mmol/L   Potassium 4.1 3.5 - 5.1 mmol/L   Chloride 112 (H) 98 - 111 mmol/L   CO2 19 (L) 22 - 32 mmol/L   Glucose, Bld 91 70 - 99 mg/dL   BUN 32 (H) 8 - 23 mg/dL   Creatinine, Ser 1.30 (H) 0.44 - 1.00 mg/dL   Calcium 9.0 8.9 - 10.3 mg/dL   GFR calc non Af Amer 42 (L) >60 mL/min   GFR calc Af Amer 49 (L) >60 mL/min   Anion gap 8 5 - 15    Comment: Performed at [facility], [place] [address] [place], [place] [address]    Blood Alcohol level:  Lab Results  Component Value Date   ETH <10 12/17/2019    Physical Findings: AIMS:  , ,  ,  ,    CIWA:  CIWA-Ar Total: 5 COWS:  COWS Total Score: 9  Musculoskeletal: Strength & Muscle Tone: Unable to assess via telepsych Gait & Station: Did not see patient ambulate Patient leans: N/A  Psychiatric Specialty Exam: Physical Exam Vitals and nursing note reviewed.  Psychiatric:        Attention and Perception: Attention normal.        Mood and Affect: Mood normal.        Speech: Speech normal.        Behavior: Behavior normal. Behavior is cooperative.        Thought Content: Thought content normal.     Review of Systems  Psychiatric/Behavioral: Agitation: Denies. Hallucinations: Denies. Self-injury: Denies. Sleep disturbance: Denies; states  she is sleeping good. Suicidal ideas: Denies. Nervous/anxious: Stable.        Patient denies suicidal/self-harm/homicidal ideation, psychosis, and paranoia     Blood pressure 122/64, pulse 84, temperature 98.4 F (36.9 C), temperature source Oral, resp. rate 18, height 5\' 7"  (1.702 m), weight 79.1 kg, SpO2 99 %.Body mass index is 27.31 kg/m.  General Appearance: Unable to determine camera not working  [employer]:  Unable to see patients eyes  Speech:  Clear and Coherent and Normal Rate  Volume:  Normal  Mood:  "Good"  Affect:  Congruent  Thought Process:  Coherent and Goal Directed  Orientation:  Full (Time, Place, and Person)  Thought Content:  WDL  Suicidal Thoughts:  No  Homicidal Thoughts:  No  Memory:  Immediate;   Good Recent;   Fair  Judgement:  Fair  Insight:  Present  Psychomotor Activity:  Normal  Concentration:  Concentration: Fair and Attention Span: Fair  Recall:  [employer] of Knowledge:  Fair  Language:  Good  Akathisia:  No  Handed:  Right  AIMS (if indicated):     Assets:  Communication Skills Desire for Improvement Housing Social Support  ADL's:  Impaired  Cognition:  Impaired,  Mild patient cognition improved since this providers last assessment.  Possible back at her baseline  Sleep:       Treatment Plan Summary: Plan Psychiatrically clear.   Recommendation: Continue with current psychotropic medications.  Follow up with current outpatient psychiatric provider.  Consult social work to arrange transition back to assisted living or nursing home once the patient has been medically cleared for discharge.      Disposition:  Patient psychiatrically cleared No evidence of imminent risk  to self or others at present.   Patient does not meet criteria for psychiatric inpatient admission. Supportive therapy provided about ongoing stressors. Discussed crisis plan, support from social network, calling 911, coming to the Emergency Department, and calling Suicide  Hotline.   Messaged Dr. Louanne Belton recommendation and disposition information.    [place] Ringer, NP 12/27/2019, 12:43 PM

## 2019-12-28 LAB — RESPIRATORY PANEL BY RT PCR (FLU A&B, COVID)
Influenza A by PCR: NEGATIVE
Influenza B by PCR: NEGATIVE
SARS Coronavirus 2 by RT PCR: NEGATIVE

## 2019-12-28 LAB — VALPROIC ACID LEVEL: Valproic Acid Lvl: 50 ug/mL (ref 50.0–100.0)

## 2019-12-28 NOTE — Progress Notes (Signed)
Called Brenda Nolan, son, with updates and discharge plan.

## 2019-12-28 NOTE — TOC Progression Note (Signed)
Transition of Care [facility]) - Progression Note    Patient Details  Name: Brenda Nolan MRN: [id number] Date of Birth: 10-09-1951  [facility] Care [facility]) CM/SW Contact  Purcell Mouton, RN Phone Number: 12/28/2019, 4:44 PM  Clinical Narrative:    Reaching outside of [place] into [place], [place], and [place] area for a SNF bed.         Expected Discharge Plan and Services                                                 Social Determinants of Health (SDOH) Interventions    Readmission Risk Interventions No flowsheet data found.

## 2019-12-28 NOTE — Progress Notes (Signed)
PROGRESS NOTE  Brenda Nolan [id number] DOB: 03/17/1951 DOA: Nolan/31/2021 PCP: Patient, No Pcp Per   LOS: 5 days   Brief narrative: As per HPI,  Brenda Nolan,Brenda Nolan, Brenda Nolan disorders, stage IIIb chronic kidney disease, unspecified difficulty walking history, oropharyngeal phase dysphagia, history of C. difficile colitis,with recent admission Nolan/13/21 for AMS secondary to UTI, in psych ED since Nolan/31. Patient was admitted with altered mental status and psychotic features with history of Brenda possibly decompensation.  Psychiatry consulted. Patient was waiting to be transferred back to [place], per social worker they would take her on if there is no medical issues or if she no longer has one-on-one sitter for 24 hours. Pt continued to voice suicidal ideation and had one-on-one sitter for safety.  Patient was subsequently seen by psychiatry and has been cleared for discharge.  Currently being treated for possible UTI.  Assessment/Plan:  Principal Problem:   Brenda Nolan disorder, depressed (HCC) Active Problems:   Tachycardia  Tachycardia with positive D-dimer/chest pain Improved.  VQ scan was negative for PE.  Unable to do CT scan due to single kidney kidney transplant and CKD. 2D Echo showed EF of 65 to [id number], grade Nolan diastolic dysfunction, no regional wall motion abnormality.off heparin drip.  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  Abnormal urinalysis but urine culture was positive.  Nephrology recommended amoxicillin, will continue.  Nephrology has signed off  CKD stage IIIb, History of renal transplant Nephrology has seen the patient..  Continue cyclosporine, CellCept.  Creatinine at baseline at this time.   Non-anion gap metabolic acidosis Resolved.  On bicarb tablets.  Hypertension Continue metoprolol, hold amlodipine.  Blood pressure seems to be stable at this time.  Hypothyroidism Continue Synthroid  Brenda disorder with  psychotic features With initial expression of suicidal ideation.  Had one-to-one sitter which has been discontinued.  Continue Depakote for depression.  Psychiatry has followed the patient and patient is psychiatrically cleared for disposition.  We will continue current psychotropic medication.  No inpatient psychiatric hospitalization planned.   VTE Prophylaxis: Lovenox subcu  Code Status: Full code  Family Communication: I tried to call the patient's son [name] on the phone to update him about the clinical condition of the patient but was unable to reach him.  Disposition Plan:  . Patient is from: uncertain . Likely disposition to skilled nursing facility, seen by physical therapy who recommended skilled nursing facility. . Medically stable for disposition. . Barriers to discharge:  possible skilled nursing facility placement   Consultants:  Psych  Nephrology  Procedures:  VQ scan  Antibiotics:   Amoxicillin  Anti-infectives (From admission, onward)   Start     Dose/Rate Route Frequency Ordered Stop   12/24/19 1300  amoxicillin (AMOXIL) capsule 500 mg     500 mg Oral Every 8 hours 12/24/19 1245 01/03/20 2359   12/24/19 1200  cephALEXin (KEFLEX) capsule 250 mg  Status:  Discontinued     250 mg Oral Every 6 hours 12/24/19 0917 12/24/19 1245     Subjective: Today, patient was seen and examined at bedside.  Patient denies chest pain, shortness of breath, fever or chills.  Objective: Vitals:   12/28/19 1049 12/28/19 1257  BP: 119/62 126/80  Pulse: 74 76  Resp:  18  Temp:  98.6 F (37 C)  SpO2:  99%    Intake/Output Summary (Last 24 hours) at 12/28/2019 1538 Last data filed at 12/28/2019 1407 Gross per 24 hour  Intake 1080 ml  Output  1600 ml  Net -520 ml   Filed Weights   12/26/19 0500 12/27/19 0604 12/28/19 0543  Weight: 77.7 kg 79.Nolan kg 83.Nolan kg   Body mass index is 28.69 kg/m.   Physical Exam: GENERAL: Patient is alert awake and communicative.  Not in  obvious distress. HENT: No scleral pallor or icterus. Pupils equally reactive to light. Oral mucosa is moist NECK: is supple, no gross swelling noted. CHEST: Clear to auscultation. No crackles or wheezes.  Diminished breath sounds bilaterally. CVS: S1 and S2 heard, no murmur. Regular rate and rhythm.  ABDOMEN: Soft, non-tender, bowel sounds are present. EXTREMITIES: No edema. CNS: Cranial nerves are intact.  Moving all extremities, communicative. SKIN: warm and dry without rashes.  Data Review: I have personally reviewed the following laboratory data and studies,  CBC: Recent Labs  Lab 12/23/19 0307 12/23/19 1201  WBC 8.7 8.Nolan  HGB 12.0 11.4*  HCT 37.5 34.2*  MCV 93.5 91.9  PLT 293 [id number]   Basic Metabolic Panel: Recent Labs  Lab 12/22/19 2210 12/23/19 0307 12/25/19 0444 12/27/19 0245  NA 140 136 139 139  K 4.6 3.9 4.6 4.Nolan  CL 114* 110 111 112*  CO2 13* 17* 20* 19*  GLUCOSE 115* 125* 95 91  BUN 28* 27* 32* 32*  CREATININE Nolan.36* Nolan.24* Nolan.21* Nolan.30*  CALCIUM 9.6 9.6 9.Nolan 9.0  PHOS  --   --  3.2  --    Liver Function Tests: Recent Labs  Lab 12/23/19 0307 12/25/19 0444  AST 15  --   ALT 7  --   ALKPHOS 36*  --   BILITOT Nolan.0  --   PROT 6.8  --   ALBUMIN 3.5 3.Nolan*   No results for input(s): LIPASE, AMYLASE in the last 168 hours. Recent Labs  Lab 12/22/19 2210  AMMONIA 28   Cardiac Enzymes: No results for input(s): CKTOTAL, CKMB, CKMBINDEX, TROPONINI in the last 168 hours. BNP (last 3 results) No results for input(s): BNP in the last 8760 hours.  ProBNP (last 3 results) No results for input(s): PROBNP in the last 8760 hours.  CBG: Recent Labs  Lab 12/23/19 0041  GLUCAP 141*   Recent Results (from the past 240 hour(s))  Respiratory Panel by RT PCR (Flu A&B, Covid) - Nasopharyngeal Swab     Status: None   Collection Time: 12/19/19 10:14 PM   Specimen: Nasopharyngeal Swab  Result Value Ref Range Status   SARS Coronavirus 2 by RT PCR NEGATIVE NEGATIVE Final      Comment: (NOTE) SARS-CoV-2 target nucleic acids are NOT DETECTED. The SARS-CoV-2 RNA is generally detectable in upper respiratoy specimens during the acute phase of infection. The lowest concentration of SARS-CoV-2 viral copies this assay can detect is 131 copies/mL. A negative result does not preclude SARS-Cov-2 infection and should not be used as the sole basis for treatment or other patient management decisions. A negative result may occur with  improper specimen collection/handling, submission of specimen other than nasopharyngeal swab, presence of viral mutation(s) within the areas targeted by this assay, and inadequate number of viral copies (<131 copies/mL). A negative result must be combined with clinical observations, patient history, and epidemiological information. The expected result is Negative. Fact Sheet for Patients:  PinkCheek.be Fact Sheet for Healthcare Providers:  GravelBags.it This test is not yet ap proved or cleared by the [place] FDA and  has been authorized for detection and/or diagnosis of SARS-CoV-2 by FDA under an Emergency Use Authorization (EUA). This EUA will remain  in effect (meaning this test can be used) for the duration of the COVID-19 declaration under Section 564(b)([place]) of the Act, 21 U.S.C. section 360bbb-3(b)([place]), unless the authorization is terminated or revoked sooner.    Influenza A by PCR NEGATIVE NEGATIVE Final   Influenza B by PCR NEGATIVE NEGATIVE Final    Comment: (NOTE) The Xpert Xpress SARS-CoV-2/FLU/RSV assay is intended as an aid in  the diagnosis of influenza from Nasopharyngeal swab specimens and  should not be used as a sole basis for treatment. Nasal washings and  aspirates are unacceptable for Xpert Xpress SARS-CoV-2/FLU/RSV  testing. Fact Sheet for Patients: PinkCheek.be Fact Sheet for Healthcare  Providers: GravelBags.it This test is not yet approved or cleared by the [place] FDA and  has been authorized for detection and/or diagnosis of SARS-CoV-2 by  FDA under an Emergency Use Authorization (EUA). This EUA will remain  in effect (meaning this test can be used) for the duration of the  Covid-19 declaration under Section 564(b)([place]) of the Act, 21  U.S.C. section 360bbb-3(b)([place]), unless the authorization is  terminated or revoked. Performed at [facility], [place] [address], [place], [place] [address]   Culture, Urine     Status: Abnormal   Collection Time: 12/23/19  3:28 PM   Specimen: Urine, Clean Catch  Result Value Ref Range Status   Specimen Description URINE, CLEAN CATCH  Final   Special Requests   Final    NONE Performed at [facility] [address], [place], [place] [address]    Culture (A)  Final    60,000 COLONIES/mL MULTIPLE SPECIES PRESENT, SUGGEST RECOLLECTION   Report Status 12/25/2019 FINAL  Final     Studies: No results found.    Flora Lipps, MD  [facility] 12/28/2019

## 2019-12-28 NOTE — TOC Progression Note (Signed)
Transition of Care [facility]) - Progression Note    Patient Details  Name: Brenda Nolan MRN: [id number] Date of Birth: 27-Sep-1951  [facility]) CM/SW Contact  Purcell Mouton, RN Phone Number: 12/28/2019, 2:33 PM  Clinical Narrative:    A call was made to pt's son Achille Rich [facility] [phone] who could not talk at present time related to him teaching a class. Son will return call after his class.         Expected Discharge Plan and Services                                                 Social Determinants of Health (SDOH) Interventions    Readmission Risk Interventions No flowsheet data found.

## 2019-12-28 NOTE — Plan of Care (Signed)
  Problem: Clinical Measurements: Goal: Ability to maintain clinical measurements within normal limits will improve Outcome: Progressing Goal: Diagnostic test results will improve Outcome: Progressing Goal: Respiratory complications will improve Outcome: Progressing Goal: Cardiovascular complication will be avoided Outcome: Progressing   Problem: Nutrition: Goal: Adequate nutrition will be maintained Outcome: Progressing   Problem: Coping: Goal: Level of anxiety will decrease Outcome: Progressing   Problem: Pain Managment: Goal: General experience of comfort will improve Outcome: Progressing   Problem: Safety: Goal: Ability to remain free from injury will improve Outcome: Progressing

## 2019-12-28 NOTE — Plan of Care (Signed)

## 2019-12-29 LAB — CBC
HCT: 32.1 % — ABNORMAL LOW (ref 36.0–46.0)
Hemoglobin: 10.4 g/dL — ABNORMAL LOW (ref 12.0–15.0)
MCH: 30.2 pg (ref 26.0–34.0)
MCHC: 32.4 g/dL (ref 30.0–36.0)
MCV: 93.3 fL (ref 80.0–100.0)
Platelets: 247 10*3/uL (ref 150–400)
RBC: 3.44 MIL/uL — ABNORMAL LOW (ref 3.87–5.11)
RDW: 12.2 % (ref 11.5–15.5)
WBC: 6.8 10*3/uL (ref 4.0–10.5)
nRBC: 0 % (ref 0.0–0.2)

## 2019-12-29 LAB — BASIC METABOLIC PANEL
Anion gap: 8 (ref 5–15)
BUN: 37 mg/dL — ABNORMAL HIGH (ref 8–23)
CO2: 21 mmol/L — ABNORMAL LOW (ref 22–32)
Calcium: 9.4 mg/dL (ref 8.9–10.3)
Chloride: 110 mmol/L (ref 98–111)
Creatinine, Ser: 1.36 mg/dL — ABNORMAL HIGH (ref 0.44–1.00)
GFR calc Af Amer: 47 mL/min — ABNORMAL LOW (ref >60)
GFR calc non Af Amer: 40 mL/min — ABNORMAL LOW (ref >60)
Glucose, Bld: 92 mg/dL (ref 70–99)
Potassium: 4.2 mmol/L (ref 3.5–5.1)
Sodium: 139 mmol/L (ref 135–145)

## 2019-12-29 LAB — MAGNESIUM: Magnesium: 2.1 mg/dL (ref 1.7–2.4)

## 2019-12-29 MED ORDER — LORAZEPAM 1 MG PO TABS: 1.0000 mg | ORAL_TABLET | Freq: Three times a day (TID) | ORAL | Status: DC | PRN

## 2019-12-29 MED ORDER — FENOFIBRATE 160 MG PO TABS
160.0000 mg | ORAL_TABLET | Freq: Every day | ORAL | Status: DC
  Administered 2019-12-30 – 2020-01-05 (×7): 160 mg via ORAL
  Filled 2019-12-29 (×7): qty 1

## 2019-12-29 MED ORDER — MEGESTROL ACETATE 400 MG/10ML PO SUSP
400.0000 mg | Freq: Two times a day (BID) | ORAL | Status: DC
  Administered 2019-12-29 – 2020-01-05 (×14): 400 mg via ORAL
  Filled 2019-12-29 (×14): qty 10

## 2019-12-29 MED ORDER — MEGESTROL ACETATE 40 MG/ML PO SUSP
20.0000 mg | Freq: Every day | ORAL | Status: DC
  Filled 2019-12-29: qty 5

## 2019-12-29 NOTE — Progress Notes (Signed)
PROGRESS NOTE  Brenda Nolan [id number] DOB: 24-May-1951 DOA: 12/17/2019 PCP: Patient, No Pcp Per   LOS: 6 days   Brief narrative: As per HPI,  Brenda Nolan a81 y.o.female,anxiety, bipolar 1 disorders, stage IIIb chronic kidney disease, unspecified difficulty walking history, oropharyngeal phase dysphagia, history of C. difficile colitis,with recent admission 11/29/19 for AMS secondary to UTI, in psych ED since 1/31. Patient was admitted with altered mental status and psychotic features with history of bipolar possibly decompensation.  Psychiatry consulted. Patient was waiting to be transferred back to [place], per social worker they would take her on if there is no medical issues or if she no longer has one-on-one sitter for 24 hours. Pt continued to voice suicidal ideation and had one-on-one sitter for safety.  Patient was subsequently seen by psychiatry and has been cleared for discharge.    Assessment/Plan:  Principal Problem:   Bipolar 1 disorder, depressed (HCC) Active Problems:   Tachycardia  Tachycardia with positive D-dimer/chest pain Improved..  VQ scan was negative for PE.  Unable to do CT scan due to single kidney kidney transplant and CKD. 2D Echo showed EF of 65 to [id number], grade 1 diastolic dysfunction, no regional wall motion abnormality.  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  Abnormal urinalysis but urine culture was positive.  Nephrology recommended amoxicillin from 12/24/19, will continue for total of 7 days.  Nephrology has signed off  CKD stage IIIb, History of renal transplant Nephrology has seen the patient..  Continue cyclosporine, CellCept.  Creatinine at baseline at this time.   Non-anion gap metabolic acidosis Resolved.  On bicarb tablets.  Hypertension Continue metoprolol, hold amlodipine.  Blood pressure seems to be stable at this time.  Hypothyroidism Continue Synthroid  Bipolar disorder with psychotic features With  initial expression of suicidal ideation.  Had one-to-one sitter which has been discontinued.  Continue Depakote for depression.  Psychiatry has followed the patient and patient is psychiatrically cleared for disposition.  We will continue current psychotropic medication.  No inpatient psychiatric hospitalization planned.   VTE Prophylaxis: Lovenox subcu  Code Status: Full code  Family Communication: None today.  Disposition Plan:  . Patient is from: uncertain . Likely disposition to skilled nursing facility, pending time . Medically stable for disposition. . Barriers to discharge:  skilled nursing facility placement   Consultants:  Psych  Nephrology  Procedures:  VQ scan  Antibiotics:   Amoxicillin  Anti-infectives (From admission, onward)   Start     Dose/Rate Route Frequency Ordered Stop   12/24/19 1300  amoxicillin (AMOXIL) capsule 500 mg     500 mg Oral Every 8 hours 12/24/19 1245 01/03/20 2359   12/24/19 1200  cephALEXin (KEFLEX) capsule 250 mg  Status:  Discontinued     250 mg Oral Every 6 hours 12/24/19 0917 12/24/19 1245     Subjective: Today, patient was seen and examined at bedside.  Patient denies any dizziness, lightheadedness, chest pain, shortness of breath or fever.    Objective: Vitals:   12/28/19 2117 12/29/19 0409  BP: 130/87 106/73  Pulse: 95 70  Resp: 20 (!) 22  Temp: 98.7 F (37.1 C) 98.8 F (37.1 C)  SpO2: 99% 98%    Intake/Output Summary (Last 24 hours) at 12/29/2019 1306 Last data filed at 12/29/2019 1155 Gross per 24 hour  Intake 1080 ml  Output 1500 ml  Net -420 ml   Filed Weights   12/27/19 0604 12/28/19 0543 12/29/19 0409  Weight: 79.1 kg 83.1 kg 75.6  kg   Body mass index is 26.1 kg/m.   Physical Exam:  GENERAL: Patient is alert awake and communicative.  Not in obvious distress. HENT: No scleral pallor or icterus. Pupils equally reactive to light. Oral mucosa is moist NECK: is supple, no gross swelling noted. CHEST:  Clear to auscultation. No crackles or wheezes.  Diminished breath sounds bilaterally. CVS: S1 and S2 heard, no murmur. Regular rate and rhythm.  ABDOMEN: Soft, non-tender, bowel sounds are present. EXTREMITIES: No edema. CNS: Cranial nerves are intact.  Moving all extremities, communicative. SKIN: warm and dry without rashes.  Data Review: I have personally reviewed the following laboratory data and studies,  CBC: Recent Labs  Lab 12/23/19 0307 12/23/19 1201 12/29/19 0235  WBC 8.7 8.1 6.8  HGB 12.0 11.4* 10.4*  HCT 37.5 34.2* 32.1*  MCV 93.5 91.9 93.3  PLT 293 291 [id number]   Basic Metabolic Panel: Recent Labs  Lab 12/22/19 2210 12/23/19 0307 12/25/19 0444 12/27/19 0245 12/29/19 0235  NA 140 136 139 139 139  K 4.6 3.9 4.6 4.1 4.2  CL 114* 110 111 112* 110  CO2 13* 17* 20* 19* 21*  GLUCOSE 115* 125* 95 91 92  BUN 28* 27* 32* 32* 37*  CREATININE 1.36* 1.24* 1.21* 1.30* 1.36*  CALCIUM 9.6 9.6 9.1 9.0 9.4  MG  --   --   --   --  2.1  PHOS  --   --  3.2  --   --    Liver Function Tests: Recent Labs  Lab 12/23/19 0307 12/25/19 0444  AST 15  --   ALT 7  --   ALKPHOS 36*  --   BILITOT 1.0  --   PROT 6.8  --   ALBUMIN 3.5 3.1*   No results for input(s): LIPASE, AMYLASE in the last 168 hours. Recent Labs  Lab 12/22/19 2210  AMMONIA 28   Cardiac Enzymes: No results for input(s): CKTOTAL, CKMB, CKMBINDEX, TROPONINI in the last 168 hours. BNP (last 3 results) No results for input(s): BNP in the last 8760 hours.  ProBNP (last 3 results) No results for input(s): PROBNP in the last 8760 hours.  CBG: Recent Labs  Lab 12/23/19 0041  GLUCAP 141*   Recent Results (from the past 240 hour(s))  Respiratory Panel by RT PCR (Flu A&B, Covid) - Nasopharyngeal Swab     Status: None   Collection Time: 12/19/19 10:14 PM   Specimen: Nasopharyngeal Swab  Result Value Ref Range Status   SARS Coronavirus 2 by RT PCR NEGATIVE NEGATIVE Final    Comment: (NOTE) SARS-CoV-2 target  nucleic acids are NOT DETECTED. The SARS-CoV-2 RNA is generally detectable in upper respiratoy specimens during the acute phase of infection. The lowest concentration of SARS-CoV-2 viral copies this assay can detect is 131 copies/mL. A negative result does not preclude SARS-Cov-2 infection and should not be used as the sole basis for treatment or other patient management decisions. A negative result may occur with  improper specimen collection/handling, submission of specimen other than nasopharyngeal swab, presence of viral mutation(s) within the areas targeted by this assay, and inadequate number of viral copies (<131 copies/mL). A negative result must be combined with clinical observations, patient history, and epidemiological information. The expected result is Negative. Fact Sheet for Patients:  PinkCheek.be Fact Sheet for Healthcare Providers:  GravelBags.it This test is not yet ap proved or cleared by the [place] FDA and  has been authorized for detection and/or diagnosis of SARS-CoV-2 by FDA under  an Emergency Use Authorization (EUA). This EUA will remain  in effect (meaning this test can be used) for the duration of the COVID-19 declaration under Section 564(b)(1) of the Act, 21 U.S.C. section 360bbb-3(b)(1), unless the authorization is terminated or revoked sooner.    Influenza A by PCR NEGATIVE NEGATIVE Final   Influenza B by PCR NEGATIVE NEGATIVE Final    Comment: (NOTE) The Xpert Xpress SARS-CoV-2/FLU/RSV assay is intended as an aid in  the diagnosis of influenza from Nasopharyngeal swab specimens and  should not be used as a sole basis for treatment. Nasal washings and  aspirates are unacceptable for Xpert Xpress SARS-CoV-2/FLU/RSV  testing. Fact Sheet for Patients: PinkCheek.be Fact Sheet for Healthcare Providers: GravelBags.it This test is not  yet approved or cleared by the [place] FDA and  has been authorized for detection and/or diagnosis of SARS-CoV-2 by  FDA under an Emergency Use Authorization (EUA). This EUA will remain  in effect (meaning this test can be used) for the duration of the  Covid-19 declaration under Section 564(b)(1) of the Act, 21  U.S.C. section 360bbb-3(b)(1), unless the authorization is  terminated or revoked. Performed at [facility], [place] [address], [place], [place] [address]   Culture, Urine     Status: Abnormal   Collection Time: 12/23/19  3:28 PM   Specimen: Urine, Clean Catch  Result Value Ref Range Status   Specimen Description URINE, CLEAN CATCH  Final   Special Requests   Final    NONE Performed at [facility] [address] [place], [place] [address]    Culture (A)  Final    60,000 COLONIES/mL MULTIPLE SPECIES PRESENT, SUGGEST RECOLLECTION   Report Status 12/25/2019 FINAL  Final  Respiratory Panel by RT PCR (Flu A&B, Covid) - Nasopharyngeal Swab     Status: None   Collection Time: 12/28/19 11:15 AM   Specimen: Nasopharyngeal Swab  Result Value Ref Range Status   SARS Coronavirus 2 by RT PCR NEGATIVE NEGATIVE Final    Comment: (NOTE) SARS-CoV-2 target nucleic acids are NOT DETECTED. The SARS-CoV-2 RNA is generally detectable in upper respiratoy specimens during the acute phase of infection. The lowest concentration of SARS-CoV-2 viral copies this assay can detect is 131 copies/mL. A negative result does not preclude SARS-Cov-2 infection and should not be used as the sole basis for treatment or other patient management decisions. A negative result may occur with  improper specimen collection/handling, submission of specimen other than nasopharyngeal swab, presence of viral mutation(s) within the areas targeted by this assay, and inadequate number of viral copies (<131 copies/mL). A negative result must be combined with clinical  observations, patient history, and epidemiological information. The expected result is Negative. Fact Sheet for Patients:  PinkCheek.be Fact Sheet for Healthcare Providers:  GravelBags.it This test is not yet ap proved or cleared by the [place] FDA and  has been authorized for detection and/or diagnosis of SARS-CoV-2 by FDA under an Emergency Use Authorization (EUA). This EUA will remain  in effect (meaning this test can be used) for the duration of the COVID-19 declaration under Section 564(b)(1) of the Act, 21 U.S.C. section 360bbb-3(b)(1), unless the authorization is terminated or revoked sooner.    Influenza A by PCR NEGATIVE NEGATIVE Final   Influenza B by PCR NEGATIVE NEGATIVE Final    Comment: (NOTE) The Xpert Xpress SARS-CoV-2/FLU/RSV assay is intended as an aid in  the diagnosis of influenza from Nasopharyngeal swab specimens and  should not be  used as a sole basis for treatment. Nasal washings and  aspirates are unacceptable for Xpert Xpress SARS-CoV-2/FLU/RSV  testing. Fact Sheet for Patients: PinkCheek.be Fact Sheet for Healthcare Providers: GravelBags.it This test is not yet approved or cleared by the [place] FDA and  has been authorized for detection and/or diagnosis of SARS-CoV-2 by  FDA under an Emergency Use Authorization (EUA). This EUA will remain  in effect (meaning this test can be used) for the duration of the  Covid-19 declaration under Section 564(b)(1) of the Act, 21  U.S.C. section 360bbb-3(b)(1), unless the authorization is  terminated or revoked. Performed at [facility], [place] [address] [place], [place] [address]      Studies: No results found.    Flora Lipps, MD  [facility] 12/29/2019

## 2019-12-29 NOTE — Care Management Important Message (Signed)
Important Message  Patient Details IM Letter given to Gabriel Earing RN Case Manager to present to the Patient Name: Brenda Nolan MRN: [id number] Date of Birth: [birth year]   Medicare Important Message Given:  Yes     Kerin Salen 12/29/2019, 9:37 AM

## 2019-12-29 NOTE — Plan of Care (Signed)

## 2019-12-29 NOTE — Progress Notes (Addendum)
Physical Therapy Treatment Patient Details Name: Brenda Nolan MRN: [id number] DOB: 07-Aug-1951 Today's Date: 12/29/2019    History of Present Illness This 69 year old female was admitted from [place], where she was receiving rehab.  She presented with tachycardia and CP.  PMH:  bipolar, anxiety, difficulty walking, cdiff, and AMS    PT Comments    General Comments: appears at prior cognitive level.  AxO x 3 and able to tell me "I have a kidney infection".  Pleasant, funny.Assisted OOB to [facility].  General bed mobility comments: increased time but self able. General transfer comment: assisted from elevated bed to [facility].  Pt self able with close Supervision for safety.  25% VC's on turn completion to [facility] and VC's to reach back prior to sit but pt "plopped" anyway, then laughed.  General Gait Details: pt tolerated a functional distance with + 1 assist with walker as prior she uses.  Mild c/o fatigue.  "I need to sit".  Self aware.  Slight unsteady with turns.  Follow Up Recommendations  SNF;Supervision/Assistance - 24 hour(pt has improved and looks to be close to prior mobility level.  May be able to return to [place] pending "their evaluation" for pt to return.  If not, then SNF)     Equipment Recommendations  None recommended by PT    Recommendations for Other Services       Precautions / Restrictions Precautions Precaution Comments: multiple falls Restrictions Weight Bearing Restrictions: No    Mobility  Bed Mobility Overal bed mobility: Needs Assistance Bed Mobility: Supine to Sit     Supine to sit: Min guard;Supervision     General bed mobility comments: increased time but self able  Transfers Overall transfer level: Needs assistance Equipment used: Rolling walker (2 wheeled) Transfers: Sit to/from [facility] Sit to Stand: Supervision;Min guard Stand pivot transfers: Supervision;Min guard       General transfer comment: assisted from elevated  bed to [facility].  Pt self able with close Supervision for safety.  25% VC's on turn completion to [facility] and VC's to reach back prior to sit but pt "plopped" anyway, then laughed.  Ambulation/Gait Ambulation/Gait assistance: Min assist;Min guard Gait Distance (Feet): 43 Feet Assistive device: Rolling walker (2 wheeled) Gait Pattern/deviations: Narrow base of support;Step-through pattern Gait velocity: decreased   General Gait Details: pt tolerated a functional distance with + 1 assist with walker as prior she uses.  Mild c/o fatigue.  "I need to sit".  Self aware.  Slight unsteady with turns.   Stairs             Wheelchair Mobility    Modified Rankin (Stroke Patients Only)       Balance                                            Cognition Arousal/Alertness: Awake/alert   Overall Cognitive Status: Within Functional Limits for tasks assessed                                 General Comments: appears at prior cognitive level.  AxO x 3 and able to tell me "I have a kidney infection".  Pleasant, funny.      Exercises      General Comments        Pertinent Vitals/Pain  Pain Assessment: No/denies pain    Home Living                      Prior Function            PT Goals (current goals can now be found in the care plan section) Progress towards PT goals: Progressing toward goals    Frequency    Min 2X/week      PT Plan Current plan remains appropriate    Co-evaluation              AM-PAC PT "6 Clicks" Mobility   Outcome Measure  Help needed turning from your back to your side while in a flat bed without using bedrails?: A Little Help needed moving from lying on your back to sitting on the side of a flat bed without using bedrails?: A Little Help needed moving to and from a bed to a chair (including a wheelchair)?: A Little Help needed standing up from a chair using your arms (e.g., wheelchair or bedside  chair)?: A Little Help needed to walk in hospital room?: A Little Help needed climbing 3-5 steps with a railing? : A Lot 6 Click Score: 17    End of Session Equipment Utilized During Treatment: Gait belt Activity Tolerance: Patient tolerated treatment well Patient left: with call bell/phone within reach;in chair;with chair alarm set Nurse Communication: Mobility status PT Visit Diagnosis: Difficulty in walking, not elsewhere classified (R26.2);Unsteadiness on feet (R26.81);History of falling (Z91.81)     Time: [id number] PT Time Calculation (min) (ACUTE ONLY): 25 min  Charges:  $Gait Training: 8-22 mins $Therapeutic Activity: 8-22 mins                     Rica Koyanagi  PTA Acute  Rehabilitation Services Pager      [phone] Office      [phone]

## 2019-12-30 DIAGNOSIS — R45851 Suicidal ideations: Secondary | ICD-10-CM

## 2019-12-30 DIAGNOSIS — F319 Bipolar disorder, unspecified: Secondary | ICD-10-CM

## 2019-12-30 MED ORDER — DIVALPROEX SODIUM 250 MG PO DR TAB
500.0000 mg | DELAYED_RELEASE_TABLET | Freq: Two times a day (BID) | ORAL | Status: DC
  Administered 2019-12-30 – 2020-01-05 (×12): 500 mg via ORAL
  Filled 2019-12-30 (×12): qty 2

## 2019-12-30 NOTE — Consult Note (Signed)
Telepsych Consultation   Reason for Consult: '' Suicidal ideation, worsened mood Referring Physician: Flora Lipps, MD  Location of Patient: Brenda Nolan  Location of Provider: [facility]  Patient Identification: Brenda Nolan MRN:  [id number] Principal Diagnosis: Bipolar 1 disorder, depressed ([place]) Diagnosis:  Principal Problem:   Bipolar 1 disorder, depressed ([place]) Active Problems:   Tachycardia   Total Time spent with patient: 30 minutes  Subjective:   '' I am suicidal but I have no specific plan.''   Objective:   Patient is a poor historian, per chart she has history of Bipolar 1 disorders, Anxiety, stage IIIb chronic kidney disease, unspecified difficulty walking history, oropharyngeal phase dysphagia, history of C. difficile colitis who was admitted on 11/29/19 for AMS, psychosis secondary to UTI. Patient reports that she has been stressed out, having passive suicidal thoughts due to dealing with multiple medical issues and prolonged Covid-19 pandemic but has no specific plan to take her life. Otherwise, patient denies depression, psychosis, delusions but says she gets easily agitated and irritable.   Past Psychiatric History:  Reports history of bipolar disorder  Risk to Self: Suicidal Ideation: Yes-Currently Present Suicidal Intent: No Is patient at risk for suicide?: (See notes) Suicidal Plan?: No(See notes) What has been your use of drugs/alcohol within the last 12 months?: Denied How many times?: 1 Intentional Self Injurious Behavior: None Risk to Others: Homicidal Ideation: No Thoughts of Harm to Others: No Current Homicidal Intent: No Current Homicidal Plan: No Access to Homicidal Means: No History of harm to others?: No Assessment of Violence: None Noted Does patient have access to weapons?: No Criminal Charges Pending?: No Does patient have a court date: No Prior Inpatient Therapy: Prior Inpatient Therapy: Yes Prior Therapy Dates: 2020 Prior  Therapy Facilty/Provider(s): [facility] Reason for Treatment: [place]ter[place] mental status Prior Outpatient Therapy: Prior Outpatient Therapy: Yes Prior Therapy Dates: Ongoing Prior Therapy Facilty/Provider(s): Unsure -- provider is psychiatrist at [facility] Reason for Treatment: Bipolar Does patient have an ACCT team?: No Does patient have Intensive In-House Services?  : No Does patient have [facility] services? : No Does patient have P4CC services?: No  Past Medical History:  Past Medical History:  Diagnosis Date  . Anxiety 2019  . Bipolar 1 disorder ([place]) 2019  . Chronic kidney disease (CKD)    Stage 1  . Difficulty in walking, not elsewhere classified   . Dysphagia, oropharyngeal phase   . Enterocolitis due to Clostridioides difficile 2019  . Hypothyroidism 2019  . Kidney transplant status   . Muscle weakness   . Urinary tract infection    History reviewed. No pertinent surgical history. Family History: History reviewed. No pertinent family history. Family Psychiatric  History: Unaware Social History:  Social History   Substance and Sexual Activity  Alcohol Use Not Currently     Social History   Substance and Sexual Activity  Drug Use Never    Social History   Socioeconomic History  . Marital status: Married    Spouse name: Not on file  . Number of children: Not on file  . Years of education: Not on file  . Highest education level: Not on file  Occupational History  . Occupation: Retired  Tobacco Use  . Smoking status: Never Smoker  . Smokeless tobacco: Never Used  Substance and Sexual Activity  . Alcohol use: Not Currently  . Drug use: Never  . Sexual activity: Not Currently  Other Topics Concern  . Not on file  Social History Narrative   Pt  currently resides at [facility] where she is being treated for an infection.  Pt receives outpatient psychiatric services through a provider at [facility].   Social Determinants of Health   Financial Resource Strain:   . Difficulty  of Paying Living Expenses: Not on file  Food Insecurity:   . Worried About Charity fundraiser in the Last Year: Not on file  . Ran Out of Food in the Last Year: Not on file  Transportation Needs:   . Lack of Transportation (Medical): Not on file  . Lack of Transportation (Non-Medical): Not on file  Physical Activity:   . Days of Exercise per Week: Not on file  . Minutes of Exercise per Session: Not on file  Stress:   . Feeling of Stress : Not on file  Social Connections:   . Frequency of Communication with Friends and Family: Not on file  . Frequency of Social Gatherings with Friends and Family: Not on file  . Attends Religious Services: Not on file  . Active Member of Clubs or Organizations: Not on file  . Attends Archivist Meetings: Not on file  . Marital Status: Not on file   Additional Social History:    Allergies:   Allergies  Allergen Reactions  . Atorvastatin Other (See Comments)    Myalgias  . Ketoconazole Rash  . Abilify [Aripiprazole] Other (See Comments)    Unknown  . Ibuprofen Other (See Comments)    Unknown  . Levaquin [Levofloxacin] Other (See Comments)    Unknown  . Sulfa Antibiotics Other (See Comments)    Unknown  . Sulfamethoxazole Other (See Comments)    Unknown    Labs:  Results for orders placed or performed during the hospital encounter of 12/17/19 (from the past 48 hour(s))  CBC     Status: Abnormal   Collection Time: 12/29/19  2:35 AM  Result Value Ref Range   WBC 6.8 4.0 - 10.5 K/uL   RBC 3.44 (L) 3.87 - 5.11 MIL/uL   Hemoglobin 10.4 (L) 12.0 - 15.0 g/dL   HCT 32.1 (L) 36.0 - 46.0 %   MCV 93.3 80.0 - 100.0 fL   MCH 30.2 26.0 - 34.0 pg   MCHC 32.4 30.0 - 36.0 g/dL   RDW 12.2 11.5 - 15.5 %   Platelets 247 150 - 400 K/uL   nRBC 0.0 0.0 - 0.2 %    Comment: Performed at [facility], [place] [address] [place], [place] [id number]  Basic metabolic panel     Status: Abnormal   Collection Time: 12/29/19  2:35 AM   Result Value Ref Range   Sodium 139 135 - 145 mmol/L   Potassium 4.2 3.5 - 5.1 mmol/L   Chloride 110 98 - 111 mmol/L   CO2 21 (L) 22 - 32 mmol/L   Glucose, Bld 92 70 - 99 mg/dL   BUN 37 (H) 8 - 23 mg/dL   Creatinine, Ser 1.36 (H) 0.44 - 1.00 mg/dL   Calcium 9.4 8.9 - 10.3 mg/dL   GFR calc non Af Amer 40 (L) >60 mL/min   GFR calc Af Amer 47 (L) >60 mL/min   Anion gap 8 5 - 15    Comment: Performed at [place] [address] [place],  [address]  Magnesium     Status: None   Collection Time: 12/29/19  2:35 AM  Result Value Ref Range   Magnesium 2.1 1.7 - 2.4 mg/dL    Comment: Performed  at [facility], [place] [address], [place], [place] [address]    Medications:  Current Facility-Administered Medications  Medication Dose Route Frequency Provider Last Rate Last Admin  . acetaminophen (TYLENOL) tablet 650 mg  650 mg Oral Q6H PRN Jani Gravel, MD   650 mg at 12/25/19 2151   Or  . acetaminophen (TYLENOL) suppository 650 mg  650 mg Rectal Q6H PRN Jani Gravel, MD      . amoxicillin (AMOXIL) capsule 500 mg  500 mg Oral Q8H Cristal Deer, MD   500 mg at 12/30/19 1410  . cycloSPORINE (SANDIMMUNE) capsule 125 mg  125 mg Oral q morning - 10a Couture, Cortni S, PA-C   125 mg at 12/30/19 [id number]  . cycloSPORINE (SANDIMMUNE) capsule 150 mg  150 mg Oral QHS Couture, Cortni S, PA-C   150 mg at 12/29/19 2152  . divalproex (DEPAKOTE) DR tablet 750 mg  750 mg Oral QHS Rankin, Shuvon B, NP   750 mg at 12/29/19 2152  . doxazosin (CARDURA) tablet 1 mg  1 mg Oral QHS Couture, Cortni S, PA-C   1 mg at 12/29/19 2151  . enoxaparin (LOVENOX) injection 40 mg  40 mg Subcutaneous Q24H Cristal Deer, MD   40 mg at 12/29/19 2152  . fenofibrate tablet 160 mg  160 mg Oral Daily Pokhrel, Laxman, MD   160 mg at 12/30/19 [id number]  . levothyroxine (SYNTHROID) tablet 50 mcg  50 mcg Oral Q0600 Couture, Cortni S, PA-C   50 mcg at 12/30/19 0500  . lip balm (CARMEX) ointment    Topical PRN Flora Lipps, MD   Given at 12/28/19 0553  . LORazepam (ATIVAN) tablet 0.5 mg  0.5 mg Oral TID Alma Friendly, MD   0.5 mg at 12/30/19 0820  . magnesium oxide (MAG-OX) tablet 400 mg  400 mg Oral Daily Couture, Cortni S, PA-C   400 mg at 12/30/19 [id number]  . megestrol (MEGACE) 400 MG/10ML suspension 400 mg  400 mg Oral BID Polly Cobia, RPH   400 mg at 12/30/19 0820  . metoprolol tartrate (LOPRESSOR) tablet 100 mg  100 mg Oral BID Couture, Cortni S, PA-C   100 mg at 12/30/19 [id number]  . mycophenolate (CELLCEPT) capsule 250 mg  250 mg Oral BID Couture, Cortni S, PA-C   250 mg at 12/30/19 0820  . OLANZapine (ZYPREXA) tablet 5 mg  5 mg Oral QHS Couture, Cortni S, PA-C   5 mg at 12/29/19 2151  . sodium bicarbonate tablet 1,300 mg  1,300 mg Oral BID Justin Mend, MD   1,300 mg at 12/30/19 0820  . thiamine tablet 100 mg  100 mg Oral Daily Couture, Cortni S, PA-C   100 mg at 12/30/19 [id number]   Or  . thiamine (B-1) injection 100 mg  100 mg Intravenous Daily Couture, Cortni S, PA-C   100 mg at 12/20/19 [id number]    Musculoskeletal: Strength & Muscle Tone: Unable to determine via tele psych [place]: did not see patient ambulate  Patient leans: N/A  Psychiatric Specialty Exam: Physical Exam  Nursing note and vitals reviewed. Cardiovascular:  EKG shows tachycardia 124, QTc 492,  Respiratory: She is in respiratory distress.  Patient also appeared to tachypenic.  Blood gas shows metabolic alkalosis with respiratory compensation, elevated D-dimer  Physical exam deferred to tele psych exam  Psychiatric: Judgment normal. Her affect is labile. Her speech is rapid and/or pressured. She is agitated. Cognition and memory are normal. She expresses suicidal ideation.  Review of Systems  Unable to perform ROS: Acuity of condition  Psychiatric/Behavioral: Positive for suicidal ideas.       Patient responded yes to everything.  When asked if she was suicidal, homicidal, and auditory/visual  hallucinations "yes"  Patient was unable to tell what auditory or visual hallucinations she was having.       Blood pressure 139/76, pulse 72, temperature (!) 97.3 F (36.3 C), temperature source Oral, resp. rate 20, height 5\' 7"  (1.702 m), weight 75 kg, SpO2 99 %.Body mass index is 25.9 kg/m.  General Appearance: Casual  Eye Contact:  Good  Speech:  Pressured  Volume:  Increased  Mood:  Irritable  Affect:  Anxious  Thought Process:  Linear  Orientation:  Other:  Oriented to person and place  Thought Content:  Tangential  Suicidal Thoughts:  Yes.  without intent/plan  Homicidal Thoughts:  No  Memory:  Immediate;   Fair Recent;   Poor Remote;   Poor  Judgement:  Poor  Insight:  Shallow  Psychomotor Activity:  Increased  Concentration:  fair  Recall:  Poor  Fund of Knowledge:  Unable to assess  Language:  Fair  Akathisia:  Yes  -restless  Handed:  Right  AIMS (if indicated):     Assets:  Social Support Others:  family support  ADL's:  Impaired  Cognition:  Impaired,  Moderate  Sleep:       Treatment Plan Summary: 69 year old woman with history of Bipolar depression with psychosis who reports passive suicidal thoughts due to being stressed out and overwhelmed by her medical issues. Today, she denies, psychosis, delusions or any specific plan to commit suicide. Base on my evaluation, patient does not meet criteria for inpatient psychiatric admission but may benefit from psychotropic medications adjustment.   Recommendations: -Increase  Depakote DR from 750 mg at bedtime to 500 mg twice daily for agitated mood -Continue Zyprexa 5 mg at bedtime -Consider Social worker consult to facilitate referral to outpatient psychiatrist upon discharge. -Psychiatric service signing out. Re-consult as needed   Disposition: No evidence of imminent risk to self or others at present.   Patient does not meet criteria for psychiatric inpatient admission. Supportive therapy provided about  ongoing stressors.  This service was provided via telemedicine using a 2-way, interactive audio and video technology.  Names of all persons participating in this telemedicine service and their role in this encounter. Name: [name] Role: Patient  Name: Baldemar Friday Role:RN  Name: Corena Pilgrim, MD Role: Psychiatrist  Name:  Role:    Corena Pilgrim, MD 12/30/2019 3:14 PM

## 2019-12-30 NOTE — Progress Notes (Signed)
PROGRESS NOTE  Brenda Nolan [id number] DOB: 1951-01-13 DOA: 12/17/2019 PCP: Patient, No Pcp Per   LOS: 7 days   Brief narrative: As per HPI,  Brenda Nolan a68 y.o.female,anxiety, bipolar 1 disorders, stage IIIb chronic kidney disease, unspecified difficulty walking history, oropharyngeal phase dysphagia, history of C. difficile colitis,with recent admission 11/29/19 for AMS secondary to UTI, in psych ED since 1/31. Patient was admitted with altered mental status and psychotic features with history of bipolar possibly decompensation.  Psychiatry consulted. Patient was waiting to be transferred back to [place], per social worker they would take her on if there is no medical issues or if she no longer has one-on-one sitter for 24 hours. Pt continued to voice suicidal ideation and had one-on-one sitter for safety.  Patient was subsequently seen by psychiatry and has been cleared for discharge.    Assessment/Plan:  Principal Problem:   Bipolar 1 disorder, depressed (H[facility]) Active Problems:   Tachycardia  Bipolar disorder with psychotic features with suicidal ideation today. Patient is expressing suicidal ideation again today.  She appears to be a little agitated and pressurized with altered mood today.  With the patient on safety precautions.  Will reconsult psych for further evaluation. Continue Depakote for depression.    Tachycardia with positive D-dimer/chest pain Improved..  VQ scan was negative for PE.  Unable to do CT scan due to single kidney kidney transplant and CKD. 2D Echo showed EF of 65 to [id number], grade 1 diastolic dysfunction, no regional wall motion abnormality.  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  Abnormal urinalysis but urine culture was positive.  Nephrology recommended amoxicillin from 12/24/19, will continue for total of 7 days.  Nephrology has signed off  CKD stage IIIb, History of renal transplant Nephrology has seen the patient..   Continue cyclosporine, CellCept.  Creatinine at baseline at this time.  Creatinine of 1.3.  Non-anion gap metabolic acidosis Resolved.  On bicarb tablets.  Hypertension Continue metoprolol, hold amlodipine.  Blood pressure seems to be stable at this time.  Hypothyroidism Continue Synthroid   VTE Prophylaxis: Lovenox subcu  Code Status: Full code  Family Communication: None   Disposition Plan:  . Patient is from: uncertain . Likely disposition to skilled nursing facility . Medically stable for disposition but expressing suicidal ideation today.  We will get psychiatry evaluation. . Barriers to discharge: Safety sitter for suicidal ideation, psych reevaluation, Skilled nursing facility placement   Consultants:  Psych  Nephrology  Procedures:  VQ scan  Antibiotics:   Amoxicillin  Anti-infectives (From admission, onward)   Start     Dose/Rate Route Frequency Ordered Stop   12/24/19 1300  amoxicillin (AMOXIL) capsule 500 mg     500 mg Oral Every 8 hours 12/24/19 1245 01/03/20 2359   12/24/19 1200  cephALEXin (KEFLEX) capsule 250 mg  Status:  Discontinued     250 mg Oral Every 6 hours 12/24/19 0917 12/24/19 1245     Subjective: Today, patient was seen and examined at bedside.  She expresses repeated episodes of suicidal ideation.  She stated to me that she is hearing sounds saying "go and suicide".  Appears pressurized and a little more anxious.  Objective: Vitals:   12/29/19 2320 12/30/19 0446  BP: 126/71 130/85  Pulse: 71 74  Resp: 20 20  Temp: 99.3 F (37.4 C) 98.6 F (37 C)  SpO2: 98% 97%    Intake/Output Summary (Last 24 hours) at 12/30/2019 0941 Last data filed at 12/30/2019 0800 Gross per 24  hour  Intake 1200 ml  Output 1350 ml  Net -150 ml   Filed Weights   12/28/19 0543 12/29/19 0409 12/30/19 0500  Weight: 83.1 kg 75.6 kg 75 kg   Body mass index is 25.9 kg/m.   Physical Exam:  GENERAL: Patient is alert awake and communicative.   Anxious, not in obvious distress. HENT: No scleral pallor or icterus. Pupils equally reactive to light. Oral mucosa is moist NECK: is supple, no gross swelling noted. CHEST: Clear to auscultation. No crackles or wheezes.  Diminished breath sounds bilaterally. CVS: S1 and S2 heard, no murmur. Regular rate and rhythm.  ABDOMEN: Soft, non-tender, bowel sounds are present. EXTREMITIES: No edema. CNS: Cranial nerves are intact.  Moving all extremities, communicative. SKIN: warm and dry without rashes.  Data Review: I have personally reviewed the following laboratory data and studies,  CBC: Recent Labs  Lab 12/23/19 1201 12/29/19 0235  WBC 8.1 6.8  HGB 11.4* 10.4*  HCT 34.2* 32.1*  MCV 91.9 93.3  PLT 291 [id number]   Basic Metabolic Panel: Recent Labs  Lab 12/25/19 0444 12/27/19 0245 12/29/19 0235  NA 139 139 139  K 4.6 4.1 4.2  CL 111 112* 110  CO2 20* 19* 21*  GLUCOSE 95 91 92  BUN 32* 32* 37*  CREATININE 1.21* 1.30* 1.36*  CALCIUM 9.1 9.0 9.4  MG  --   --  2.1  PHOS 3.2  --   --    Liver Function Tests: Recent Labs  Lab 12/25/19 0444  ALBUMIN 3.1*   No results for input(s): LIPASE, AMYLASE in the last 168 hours. No results for input(s): AMMONIA in the last 168 hours. Cardiac Enzymes: No results for input(s): CKTOTAL, CKMB, CKMBINDEX, TROPONINI in the last 168 hours. BNP (last 3 results) No results for input(s): BNP in the last 8760 hours.  ProBNP (last 3 results) No results for input(s): PROBNP in the last 8760 hours.  CBG: No results for input(s): GLUCAP in the last 168 hours. Recent Results (from the past 240 hour(s))  Culture, Urine     Status: Abnormal   Collection Time: 12/23/19  3:28 PM   Specimen: Urine, Clean Catch  Result Value Ref Range Status   Specimen Description URINE, CLEAN CATCH  Final   Special Requests   Final    NONE Performed at [facility] [address], [place], [place] [address]    Culture (A)  Final    60,000  COLONIES/mL MULTIPLE SPECIES PRESENT, SUGGEST RECOLLECTION   Report Status 12/25/2019 FINAL  Final  Respiratory Panel by RT PCR (Flu A&B, Covid) - Nasopharyngeal Swab     Status: None   Collection Time: 12/28/19 11:15 AM   Specimen: Nasopharyngeal Swab  Result Value Ref Range Status   SARS Coronavirus 2 by RT PCR NEGATIVE NEGATIVE Final    Comment: (NOTE) SARS-CoV-2 target nucleic acids are NOT DETECTED. The SARS-CoV-2 RNA is generally detectable in upper respiratoy specimens during the acute phase of infection. The lowest concentration of SARS-CoV-2 viral copies this assay can detect is 131 copies/mL. A negative result does not preclude SARS-Cov-2 infection and should not be used as the sole basis for treatment or other patient management decisions. A negative result may occur with  improper specimen collection/handling, submission of specimen other than nasopharyngeal swab, presence of viral mutation(s) within the areas targeted by this assay, and inadequate number of viral copies (<131 copies/mL). A negative result must be combined with clinical observations, patient history, and epidemiological information.  The expected result is Negative. Fact Sheet for Patients:  PinkCheek.be Fact Sheet for Healthcare Providers:  GravelBags.it This test is not yet ap proved or cleared by the [place] FDA and  has been authorized for detection and/or diagnosis of SARS-CoV-2 by FDA under an Emergency Use Authorization (EUA). This EUA will remain  in effect (meaning this test can be used) for the duration of the COVID-19 declaration under Section 564(b)(1) of the Act, 21 U.S.C. section 360bbb-3(b)(1), unless the authorization is terminated or revoked sooner.    Influenza A by PCR NEGATIVE NEGATIVE Final   Influenza B by PCR NEGATIVE NEGATIVE Final    Comment: (NOTE) The Xpert Xpress SARS-CoV-2/FLU/RSV assay is intended as an aid in    the diagnosis of influenza from Nasopharyngeal swab specimens and  should not be used as a sole basis for treatment. Nasal washings and  aspirates are unacceptable for Xpert Xpress SARS-CoV-2/FLU/RSV  testing. Fact Sheet for Patients: PinkCheek.be Fact Sheet for Healthcare Providers: GravelBags.it This test is not yet approved or cleared by the [place] FDA and  has been authorized for detection and/or diagnosis of SARS-CoV-2 by  FDA under an Emergency Use Authorization (EUA). This EUA will remain  in effect (meaning this test can be used) for the duration of the  Covid-19 declaration under Section 564(b)(1) of the Act, 21  U.S.C. section 360bbb-3(b)(1), unless the authorization is  terminated or revoked. Performed at [facility], [place] [address], [place], [place] [address]      Studies: No results found.    Flora Lipps, MD  [facility] 12/30/2019

## 2019-12-31 LAB — BASIC METABOLIC PANEL
Anion gap: 8 (ref 5–15)
BUN: 36 mg/dL — ABNORMAL HIGH (ref 8–23)
CO2: 18 mmol/L — ABNORMAL LOW (ref 22–32)
Calcium: 9.3 mg/dL (ref 8.9–10.3)
Chloride: 114 mmol/L — ABNORMAL HIGH (ref 98–111)
Creatinine, Ser: 1.34 mg/dL — ABNORMAL HIGH (ref 0.44–1.00)
GFR calc Af Amer: 47 mL/min — ABNORMAL LOW (ref >60)
GFR calc non Af Amer: 41 mL/min — ABNORMAL LOW (ref >60)
Glucose, Bld: 97 mg/dL (ref 70–99)
Potassium: 4.3 mmol/L (ref 3.5–5.1)
Sodium: 140 mmol/L (ref 135–145)

## 2019-12-31 LAB — CBC
HCT: 33.5 % — ABNORMAL LOW (ref 36.0–46.0)
Hemoglobin: 11 g/dL — ABNORMAL LOW (ref 12.0–15.0)
MCH: 30.3 pg (ref 26.0–34.0)
MCHC: 32.8 g/dL (ref 30.0–36.0)
MCV: 92.3 fL (ref 80.0–100.0)
Platelets: 246 10*3/uL (ref 150–400)
RBC: 3.63 MIL/uL — ABNORMAL LOW (ref 3.87–5.11)
RDW: 12.2 % (ref 11.5–15.5)
WBC: 7.5 10*3/uL (ref 4.0–10.5)
nRBC: 0 % (ref 0.0–0.2)

## 2019-12-31 NOTE — Progress Notes (Signed)
PROGRESS NOTE  Brenda Nolan [id number] DOB: 06/07/1951 DOA: 12/17/2019 PCP: Patient, No Pcp Per   LOS: 8 days   Brief narrative: As per HPI,  [name] a47 y.o.female,anxiety, bipolar 1 disorders, stage IIIb chronic kidney disease, unspecified difficulty walking history, oropharyngeal phase dysphagia, history of C. difficile colitis,with recent admission 11/29/19 for AMS secondary to UTI, in psych ED since 1/31. Patient was admitted with altered mental status and psychotic features with history of bipolar possibly decompensation.  Psychiatry consulted. Patient was waiting to be transferred back to [place], per social worker they would take her on if there is no medical issues or if she no longer has one-on-one sitter for 24 hours. Pt continued to voice suicidal ideation and had one-on-one sitter for safety.  Patient was subsequently seen by psychiatry and has been cleared for discharge.    Assessment/Plan:  Principal Problem:   Bipolar 1 disorder, depressed ([place]) Active Problems:   Tachycardia  Bipolar disorder with psychotic features with intermittent expression of suicidal ideation .  Seen by psychiatry yesterday for this and recommend that be adjusted on psychotropic medication.  Patient was not at risk to herself or others.  Will discontinue one-to-one observation at this time.  Depakote dosing has been adjusted.   Tachycardia with positive D-dimer/chest pain Improved..  VQ scan was negative for PE.   2D Echo showed EF of 65 to [id number], grade 1 diastolic dysfunction, no regional wall motion abnormality.  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  Abnormal urinalysis but urine culture was negative..  Nephrology recommended amoxicillin from 12/24/19.  Nephrology has signed off.    CKD stage IIIb, History of renal transplant  Continue cyclosporine, CellCept.  Creatinine at baseline at this time.  Creatinine of 1.3.  Non-anion gap metabolic  acidosis Resolved.  On bicarb tablets.  Hypertension Continue metoprolol, hold amlodipine.  Latest blood pressure of 130/85  Hypothyroidism Continue Synthroid   VTE Prophylaxis: Lovenox subcu  Code Status: Full code  Family Communication: None   Disposition Plan:  . Patient is from: uncertain . Likely disposition to skilled nursing facility . Medically stable for disposition  . Barriers to discharge:  Skilled nursing facility placement   Consultants:  Psych  Nephrology  Procedures:  VQ scan  Antibiotics:   Amoxicillin  Anti-infectives (From admission, onward)   Start     Dose/Rate Route Frequency Ordered Stop   12/24/19 1300  amoxicillin (AMOXIL) capsule 500 mg     500 mg Oral Every 8 hours 12/24/19 1245 01/03/20 2359   12/24/19 1200  cephALEXin (KEFLEX) capsule 250 mg  Status:  Discontinued     250 mg Oral Every 6 hours 12/24/19 0917 12/24/19 1245     Subjective: Today, patient appears to be mildly anxious.  She states that she feels suicidal but without any definite plan.  Denies any nausea, vomiting or abdominal pain.    Objective: Vitals:   12/31/19 0057 12/31/19 1032  BP: 129/81 130/85  Pulse: 77 76  Resp: 16 18  Temp: 98.9 F (37.2 C) 97.7 F (36.5 C)  SpO2: 99% 99%    Intake/Output Summary (Last 24 hours) at 12/31/2019 1046 Last data filed at 12/31/2019 0900 Gross per 24 hour  Intake 720 ml  Output 2050 ml  Net -1330 ml   Filed Weights   12/29/19 0409 12/30/19 0500 12/31/19 0500  Weight: 75.6 kg 75 kg 79.4 kg   Body mass index is 27.42 kg/m.   Physical Exam:  GENERAL: Patient is  alert awake and communicative.  Anxious, not in obvious distress. HENT: No scleral pallor or icterus. Pupils equally reactive to light. Oral mucosa is moist NECK: is supple, no gross swelling noted. CHEST: Clear to auscultation. No crackles or wheezes.  Diminished breath sounds bilaterally. CVS: S1 and S2 heard, no murmur. Regular rate and rhythm.   ABDOMEN: Soft, non-tender, bowel sounds are present. EXTREMITIES: No edema. CNS: Cranial nerves are intact.  Moving all extremities, communicative. SKIN: warm and dry without rashes.  Data Review: I have personally reviewed the following laboratory data and studies,  CBC: Recent Labs  Lab 12/29/19 0235 12/31/19 0404  WBC 6.8 7.5  HGB 10.4* 11.0*  HCT 32.1* 33.5*  MCV 93.3 92.3  PLT 247 0000000   Basic Metabolic Panel: Recent Labs  Lab 12/25/19 0444 12/27/19 0245 12/29/19 0235 12/31/19 0404  NA 139 139 139 140  K 4.6 4.1 4.2 4.3  CL 111 112* 110 114*  CO2 20* 19* 21* 18*  GLUCOSE 95 91 92 97  BUN 32* 32* 37* 36*  CREATININE 1.21* 1.30* 1.36* 1.34*  CALCIUM 9.1 9.0 9.4 9.3  MG  --   --  2.1  --   PHOS 3.2  --   --   --    Liver Function Tests: Recent Labs  Lab 12/25/19 0444  ALBUMIN 3.1*   No results for input(s): LIPASE, AMYLASE in the last 168 hours. No results for input(s): AMMONIA in the last 168 hours. Cardiac Enzymes: No results for input(s): CKTOTAL, CKMB, CKMBINDEX, TROPONINI in the last 168 hours. BNP (last 3 results) No results for input(s): BNP in the last 8760 hours.  ProBNP (last 3 results) No results for input(s): PROBNP in the last 8760 hours.  CBG: No results for input(s): GLUCAP in the last 168 hours. Recent Results (from the past 240 hour(s))  Culture, Urine     Status: Abnormal   Collection Time: 12/23/19  3:28 PM   Specimen: Urine, Clean Catch  Result Value Ref Range Status   Specimen Description URINE, CLEAN CATCH  Final   Special Requests   Final    NONE Performed at [facility] [address], [place], [place] [address]    Culture (A)  Final    60,000 COLONIES/mL MULTIPLE SPECIES PRESENT, SUGGEST RECOLLECTION   Report Status 12/25/2019 FINAL  Final  Respiratory Panel by RT PCR (Flu A&B, Covid) - Nasopharyngeal Swab     Status: None   Collection Time: 12/28/19 11:15 AM   Specimen: Nasopharyngeal Swab  Result  Value Ref Range Status   SARS Coronavirus 2 by RT PCR NEGATIVE NEGATIVE Final    Comment: (NOTE) SARS-CoV-2 target nucleic acids are NOT DETECTED. The SARS-CoV-2 RNA is generally detectable in upper respiratoy specimens during the acute phase of infection. The lowest concentration of SARS-CoV-2 viral copies this assay can detect is 131 copies/mL. A negative result does not preclude SARS-Cov-2 infection and should not be used as the sole basis for treatment or other patient management decisions. A negative result may occur with  improper specimen collection/handling, submission of specimen other than nasopharyngeal swab, presence of viral mutation(s) within the areas targeted by this assay, and inadequate number of viral copies (<131 copies/mL). A negative result must be combined with clinical observations, patient history, and epidemiological information. The expected result is Negative. Fact Sheet for Patients:  PinkCheek.be Fact Sheet for Healthcare Providers:  GravelBags.it This test is not yet ap proved or cleared by the [place] and  has been authorized for detection and/or diagnosis of SARS-CoV-2 by FDA under an Emergency Use Authorization (EUA). This EUA will remain  in effect (meaning this test can be used) for the duration of the COVID-19 declaration under Section 564(b)(1) of the Act, 21 U.S.C. section 360bbb-3(b)(1), unless the authorization is terminated or revoked sooner.    Influenza A by PCR NEGATIVE NEGATIVE Final   Influenza B by PCR NEGATIVE NEGATIVE Final    Comment: (NOTE) The Xpert Xpress SARS-CoV-2/FLU/RSV assay is intended as an aid in  the diagnosis of influenza from Nasopharyngeal swab specimens and  should not be used as a sole basis for treatment. Nasal washings and  aspirates are unacceptable for Xpert Xpress SARS-CoV-2/FLU/RSV  testing. Fact Sheet for  Patients: PinkCheek.be Fact Sheet for Healthcare Providers: GravelBags.it This test is not yet approved or cleared by the [place] FDA and  has been authorized for detection and/or diagnosis of SARS-CoV-2 by  FDA under an Emergency Use Authorization (EUA). This EUA will remain  in effect (meaning this test can be used) for the duration of the  Covid-19 declaration under Section 564(b)(1) of the Act, 21  U.S.C. section 360bbb-3(b)(1), unless the authorization is  terminated or revoked. Performed at [facility], [place] [address] [place], [place] [address]      Studies: No results found.    Flora Lipps, MD  [facility] 12/31/2019

## 2020-01-01 NOTE — Progress Notes (Addendum)
Physical Therapy Treatment Patient Details Name: Brenda Nolan MRN: [id number] DOB: Oct 31, 1951 Today's Date: 01/01/2020    History of Present Illness This 69 year old female was admitted from [place], where she was receiving rehab.  She presented with tachycardia and CP.  PMH:  bipolar, anxiety, difficulty walking, cdiff, and AMS    PT Comments    Pt required MAX encouragement to get OOB this morning.  "I'm cold".  "I want to sleep".  Pt did agree to amb to and from bathroom as long as she "got back to bed".  General bed mobility comments: pt able to self perform with increased time.  General transfer comment: Pt able to self perform off bed as well as on/off regular height toilet with use of grab bar all at Supervision level.General Gait Details: pt was able to amb to and from bathroom all at Supervision level with good use of hands to steady self on walker as well as use grab bar near toilet to complete turn. Assisted back to bed covered with multiple blankets.   Follow Up Recommendations  Home Health PT @ ALF  (pt appears at prior mobility level)     Equipment Recommendations  None recommended by PT    Recommendations for Other Services       Precautions / Restrictions Precautions Precautions: Fall Precaution Comments: multiple falls Restrictions Weight Bearing Restrictions: No    Mobility  Bed Mobility Overal bed mobility: Needs Assistance Bed Mobility: Supine to Sit;Sit to Supine     Supine to sit: Supervision Sit to supine: Supervision   General bed mobility comments: pt able to self perform with increased time  Transfers Overall transfer level: Needs assistance Equipment used: Rolling walker (2 wheeled) Transfers: Sit to/from [facility] Sit to Stand: Supervision Stand pivot transfers: Supervision       General transfer comment: Pt able to self perform off bed as well as on/off regular height toilet with use of grab bar all at  Supervision level.  Ambulation/Gait Ambulation/Gait assistance: Supervision;Min guard Gait Distance (Feet): 22 Feet(to and from bathroom only was all pt would agree to do) Assistive device: Rolling walker (2 wheeled) Gait Pattern/deviations: Step-through pattern Gait velocity: decreased   General Gait Details: pt was able to amb to and from bathroom all at Supervision level with good use of hands to steady self on walker as well as use grab bar near toilet to complete turn.   Stairs             Wheelchair Mobility    Modified Rankin (Stroke Patients Only)       Balance                                            Cognition Arousal/Alertness: Awake/alert Behavior During Therapy: WFL for tasks assessed/performed Overall Cognitive Status: Within Functional Limits for tasks assessed                                 General Comments: appears at prior cognitive level.  AxO x 3 and able to tell me "I'm cold".  Pleasant, funny.      Exercises      General Comments        Pertinent Vitals/Pain Pain Assessment: No/denies pain    Home Living  Prior Function            PT Goals (current goals can now be found in the care plan section) Progress towards PT goals: Progressing toward goals    Frequency    Min 2X/week      PT Plan Current plan remains appropriate    Co-evaluation              AM-PAC PT "6 Clicks" Mobility   Outcome Measure  Help needed turning from your back to your side while in a flat bed without using bedrails?: None Help needed moving from lying on your back to sitting on the side of a flat bed without using bedrails?: None Help needed moving to and from a bed to a chair (including a wheelchair)?: None Help needed standing up from a chair using your arms (e.g., wheelchair or bedside chair)?: None Help needed to walk in hospital room?: A Little Help needed climbing 3-5  steps with a railing? : A Little 6 Click Score: 22    End of Session Equipment Utilized During Treatment: Gait belt Activity Tolerance: Patient tolerated treatment well Patient left: with call bell/phone within reach;in chair;with chair alarm set Nurse Communication: Mobility status PT Visit Diagnosis: Difficulty in walking, not elsewhere classified (R26.2);Unsteadiness on feet (R26.81);History of falling (Z91.81)     Time: [id number] PT Time Calculation (min) (ACUTE ONLY): 15 min  Charges:  $Gait Training: 8-22 mins                     Rica Koyanagi  PTA Acute  Rehabilitation Services Pager      [phone] Office      [phone]

## 2020-01-01 NOTE — Progress Notes (Signed)
PROGRESS NOTE  Brenda Nolan [id number] DOB: [birth year] DOA: 12/17/2019 PCP: Patient, No Pcp Per   LOS: 9 days   Brief narrative: As per HPI,  Brenda Nolan a64 y.o.female,anxiety, bipolar 1 disorders, stage IIIb chronic kidney disease, unspecified difficulty walking history, oropharyngeal phase dysphagia, history of C. difficile colitis,with recent admission 11/29/19 for AMS secondary to UTI, in psych ED since 1/31. Patient was admitted with altered mental status and psychotic features with history of bipolar possibly decompensation.  Psychiatry consulted. Patient was waiting to be transferred back to [place], per social worker they would take her on if there is no medical issues or if she no longer has one-on-one sitter for 24 hours. Pt continued to voice suicidal ideation and had one-on-one sitter for safety.  Patient was subsequently seen by psychiatry and has been cleared for discharge.    Assessment/Plan:  Principal Problem:   Bipolar 1 disorder, depressed ([place]) Active Problems:   Tachycardia  Bipolar disorder with psychotic features with intermittent expression of suicidal ideation.  Seen by psychiatry twice for this and recommend that be adjusted on psychotropic medication.  Patient was not at risk to herself or others.  Depakote dosing has been adjusted.   Tachycardia with positive D-dimer/chest pain Improved. VQ scan was negative for PE.   2D Echo showed EF of 65 to [id number], grade 1 diastolic dysfunction, no regional wall motion abnormality.  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  Abnormal urinalysis but urine culture was negative..  Nephrology recommended amoxicillin from 12/24/19.  Nephrology has signed off.    CKD stage IIIb, History of renal transplant  Continue cyclosporine, CellCept.  Creatinine at baseline at this time.  Creatinine of 1.3.  Non-anion gap metabolic acidosis Resolved.  On bicarb tablets.  Hypertension Continue metoprolol,  hold amlodipine.  Latest blood pressure of 130/85  Hypothyroidism Continue Synthroid   VTE Prophylaxis: Lovenox subcu  Code Status: Full code  Family Communication: I spoke with the patient's son in [place] and updated him about the clinical condition of the patient.  Disposition Plan:  . Patient is from: uncertain . Likely disposition to skilled nursing facility, likely to be a difficult disposition . Medically stable for disposition  . Barriers to discharge:  Skilled nursing facility placement pending   Consultants:  Psych  Nephrology  Procedures:  VQ scan  Antibiotics:   Amoxicillin  Anti-infectives (From admission, onward)   Start     Dose/Rate Route Frequency Ordered Stop   12/24/19 1300  amoxicillin (AMOXIL) capsule 500 mg     500 mg Oral Every 8 hours 12/24/19 1245 01/03/20 2359   12/24/19 1200  cephALEXin (KEFLEX) capsule 250 mg  Status:  Discontinued     250 mg Oral Every 6 hours 12/24/19 0917 12/24/19 1245     Subjective: Today, feels okay.  Denies any suicidal ideation.  Denies nausea vomiting fever chills or rigor.  Objective: Vitals:   12/31/19 2213 01/01/20 0651  BP: 134/71 131/69  Pulse: 66 72  Resp: 20   Temp: 99.9 F (37.7 C) 98.5 F (36.9 C)  SpO2: 97% 99%    Intake/Output Summary (Last 24 hours) at 01/01/2020 1036 Last data filed at 01/01/2020 0654 Gross per 24 hour  Intake --  Output 400 ml  Net -400 ml   Filed Weights   12/31/19 0500 01/01/20 0500 01/01/20 0651  Weight: 79.4 kg 77.7 kg 74.1 kg   Body mass index is 25.58 kg/m.   Physical Exam:  GENERAL: Patient is alert  awake and communicative.  not in obvious distress.  Communicative. HENT: No scleral pallor or icterus. Pupils equally reactive to light. Oral mucosa is moist NECK: is supple, no gross swelling noted. CHEST: Clear to auscultation. No crackles or wheezes.  Diminished breath sounds bilaterally. CVS: S1 and S2 heard, no murmur. Regular rate and rhythm.   ABDOMEN: Soft, non-tender, bowel sounds are present. EXTREMITIES: No edema. CNS: Cranial nerves are intact.  Moving all extremities, communicative. SKIN: warm and dry without rashes.  Data Review: I have personally reviewed the following laboratory data and studies,  CBC: Recent Labs  Lab 12/29/19 0235 12/31/19 0404  WBC 6.8 7.5  HGB 10.4* 11.0*  HCT 32.1* 33.5*  MCV 93.3 92.3  PLT 247 0000000   Basic Metabolic Panel: Recent Labs  Lab 12/27/19 0245 12/29/19 0235 12/31/19 0404  NA 139 139 140  K 4.1 4.2 4.3  CL 112* 110 114*  CO2 19* 21* 18*  GLUCOSE 91 92 97  BUN 32* 37* 36*  CREATININE 1.30* 1.36* 1.34*  CALCIUM 9.0 9.4 9.3  MG  --  2.1  --    Liver Function Tests: No results for input(s): AST, ALT, ALKPHOS, BILITOT, PROT, ALBUMIN in the last 168 hours. No results for input(s): LIPASE, AMYLASE in the last 168 hours. No results for input(s): AMMONIA in the last 168 hours. Cardiac Enzymes: No results for input(s): CKTOTAL, CKMB, CKMBINDEX, TROPONINI in the last 168 hours. BNP (last 3 results) No results for input(s): BNP in the last 8760 hours.  ProBNP (last 3 results) No results for input(s): PROBNP in the last 8760 hours.  CBG: No results for input(s): GLUCAP in the last 168 hours. Recent Results (from the past 240 hour(s))  Culture, Urine     Status: Abnormal   Collection Time: 12/23/19  3:28 PM   Specimen: Urine, Clean Catch  Result Value Ref Range Status   Specimen Description URINE, CLEAN CATCH  Final   Special Requests   Final    NONE Performed at [facility] [address] [place], [place] [address]    Culture (A)  Final    60,000 COLONIES/mL MULTIPLE SPECIES PRESENT, SUGGEST RECOLLECTION   Report Status 12/25/2019 FINAL  Final  Respiratory Panel by RT PCR (Flu A&B, Covid) - Nasopharyngeal Swab     Status: None   Collection Time: 12/28/19 11:15 AM   Specimen: Nasopharyngeal Swab  Result Value Ref Range Status   SARS  Coronavirus 2 by RT PCR NEGATIVE NEGATIVE Final    Comment: (NOTE) SARS-CoV-2 target nucleic acids are NOT DETECTED. The SARS-CoV-2 RNA is generally detectable in upper respiratoy specimens during the acute phase of infection. The lowest concentration of SARS-CoV-2 viral copies this assay can detect is 131 copies/mL. A negative result does not preclude SARS-Cov-2 infection and should not be used as the sole basis for treatment or other patient management decisions. A negative result may occur with  improper specimen collection/handling, submission of specimen other than nasopharyngeal swab, presence of viral mutation(s) within the areas targeted by this assay, and inadequate number of viral copies (<131 copies/mL). A negative result must be combined with clinical observations, patient history, and epidemiological information. The expected result is Negative. Fact Sheet for Patients:  PinkCheek.be Fact Sheet for Healthcare Providers:  GravelBags.it This test is not yet ap proved or cleared by the [place] FDA and  has been authorized for detection and/or diagnosis of SARS-CoV-2 by FDA under an Emergency Use Authorization (EUA). This EUA will  remain  in effect (meaning this test can be used) for the duration of the COVID-19 declaration under Section 564(b)(1) of the Act, 21 U.S.C. section 360bbb-3(b)(1), unless the authorization is terminated or revoked sooner.    Influenza A by PCR NEGATIVE NEGATIVE Final   Influenza B by PCR NEGATIVE NEGATIVE Final    Comment: (NOTE) The Xpert Xpress SARS-CoV-2/FLU/RSV assay is intended as an aid in  the diagnosis of influenza from Nasopharyngeal swab specimens and  should not be used as a sole basis for treatment. Nasal washings and  aspirates are unacceptable for Xpert Xpress SARS-CoV-2/FLU/RSV  testing. Fact Sheet for Patients: PinkCheek.be Fact Sheet  for Healthcare Providers: GravelBags.it This test is not yet approved or cleared by the [place] FDA and  has been authorized for detection and/or diagnosis of SARS-CoV-2 by  FDA under an Emergency Use Authorization (EUA). This EUA will remain  in effect (meaning this test can be used) for the duration of the  Covid-19 declaration under Section 564(b)(1) of the Act, 21  U.S.C. section 360bbb-3(b)(1), unless the authorization is  terminated or revoked. Performed at [facility], [place] [address], [place], [place] [address]      Studies: No results found.   Flora Lipps, MD  [facility] 01/01/2020

## 2020-01-01 NOTE — TOC Progression Note (Signed)
Transition of Care [facility]) - Progression Note    Patient Details  Name: Brenda Nolan MRN: [id number] Date of Birth: [birth year]  Transition of Care [facility]) CM/SW Contact  Purcell Mouton, RN Phone Number: 01/01/2020, 4:54 PM  Clinical Narrative:    A call back to [facility] revealed that pt's clinicals will be reviewed and JJ, RN will call CM in the AM.         Expected Discharge Plan and Services                                                 Social Determinants of Health (SDOH) Interventions    Readmission Risk Interventions No flowsheet data found.

## 2020-01-01 NOTE — TOC Progression Note (Signed)
Transition of Care [facility]) - Progression Note    Patient Details  Name: Brenda Nolan MRN: [id number] Date of Birth: 1950/12/27  Transition of Care [facility]) CM/SW Contact  Purcell Mouton, RN Phone Number: 01/01/2020, 2:22 PM  Clinical Narrative:    Spoke with pt's son Keturah Barre concerning no facility accepting her. Remo Lipps states that he believe it is the notes from Psychiatrist MD that's keeping her away from SNF, that his mother is not suicidal. Remo Lipps continued to say that pt was at [facility] before going to [facility] and asked if she could go back to [place]. A call was made to [facility], spoke with Dewitt Hoes, RN concerning pt return to [place]. JJ asked that clinicals be faxed to [phone] or [phone].         Expected Discharge Plan and Services                                                 Social Determinants of Health (SDOH) Interventions    Readmission Risk Interventions No flowsheet data found.

## 2020-01-01 NOTE — Care Management Important Message (Signed)
Important Message  Patient Details IM Letter given to Gabriel Earing RN Case Manager to present to the Patient Name: Brenda Nolan MRN: [id number] Date of Birth: 1951-05-19   Medicare Important Message Given:  Yes     Kerin Salen 01/01/2020, 10:19 AM

## 2020-01-02 LAB — BASIC METABOLIC PANEL
Anion gap: 8 (ref 5–15)
BUN: 40 mg/dL — ABNORMAL HIGH (ref 8–23)
CO2: 21 mmol/L — ABNORMAL LOW (ref 22–32)
Calcium: 9.2 mg/dL (ref 8.9–10.3)
Chloride: 108 mmol/L (ref 98–111)
Creatinine, Ser: 1.36 mg/dL — ABNORMAL HIGH (ref 0.44–1.00)
GFR calc Af Amer: 47 mL/min — ABNORMAL LOW (ref >60)
GFR calc non Af Amer: 40 mL/min — ABNORMAL LOW (ref >60)
Glucose, Bld: 91 mg/dL (ref 70–99)
Potassium: 4.6 mmol/L (ref 3.5–5.1)
Sodium: 137 mmol/L (ref 135–145)

## 2020-01-02 LAB — CBC
HCT: 32.8 % — ABNORMAL LOW (ref 36.0–46.0)
Hemoglobin: 10.7 g/dL — ABNORMAL LOW (ref 12.0–15.0)
MCH: 30.3 pg (ref 26.0–34.0)
MCHC: 32.6 g/dL (ref 30.0–36.0)
MCV: 92.9 fL (ref 80.0–100.0)
Platelets: 224 10*3/uL (ref 150–400)
RBC: 3.53 MIL/uL — ABNORMAL LOW (ref 3.87–5.11)
RDW: 12.2 % (ref 11.5–15.5)
WBC: 7.5 10*3/uL (ref 4.0–10.5)
nRBC: 0 % (ref 0.0–0.2)

## 2020-01-02 NOTE — Progress Notes (Signed)
   01/02/20 1100  OT Visit Information  Last OT Received On 01/02/20  Assistance Needed +1  History of Present Illness This 69 year old female was admitted from [place], where she was receiving rehab.  She presented with tachycardia and CP.  PMH:  bipolar, anxiety, difficulty walking, cdiff, and AMS  Precautions  Precautions Fall  Precaution Comments multiple falls  Pain Assessment  Pain Assessment No/denies pain  Cognition  Arousal/Alertness Awake/alert  Behavior During Therapy WFL for tasks assessed/performed  General Comments mostly wfls  ADL  Grooming Oral care;Brushing hair;Minimal assistance  Grooming Details (indicate cue type and reason) assist applying toothpaste  General ADL Comments stood with mod A, posterior bias, to replace chux pad under her  Bed Mobility  Supine to sit Min assist  General bed mobility comments light min A for trunk  Restrictions  Weight Bearing Restrictions No  Transfers  Equipment used Rolling walker (2 wheeled)  Sit to Stand Min assist;Mod assist  General transfer comment pt with posterior lean requiring assistance when going from sit to stand  OT - End of Session  Activity Tolerance Patient limited by fatigue  Patient left in bed;with call bell/phone within reach;with bed alarm set  OT Assessment/Plan  OT Visit Diagnosis Muscle weakness (generalized) (M62.81)  OT Frequency (ACUTE ONLY) Min 2X/week  Follow Up Recommendations SNF  OT Equipment 3 in 1 bedside commode  AM-PAC OT "6 Clicks" Daily Activity Outcome Measure (Version 2)  Help from another person eating meals? 3  Help from another person taking care of personal grooming? 3  Help from another person toileting, which includes using toliet, bedpan, or urinal? 1  Help from another person bathing (including washing, rinsing, drying)? 2  Help from another person to put on and taking off regular upper body clothing? 2  Help from another person to put on and taking off regular lower  body clothing? 1  6 Click Score 12  OT Goal Progression  Progress towards OT goals Progressing toward goals  OT Time Calculation  OT Start Time (ACUTE ONLY) 1024  OT Stop Time (ACUTE ONLY) 1040  OT Time Calculation (min) 16 min  OT General Charges  $OT Visit 1 Visit  OT Treatments  $Self Care/Home Management  8-22 mins  Muzamil Harker S, OTR/L Acute Rehabilitation Services 01/02/2020

## 2020-01-02 NOTE — Progress Notes (Signed)
PROGRESS NOTE  Brenda Nolan [id number] DOB: 1951-06-01 DOA: 12/17/2019 PCP: Patient, No Pcp Per   LOS: 10 days   Brief narrative: As per HPI,  Brenda Nolan a38 y.o.female,anxiety, bipolar 1 disorders, stage IIIb chronic kidney disease, unspecified difficulty walking history, oropharyngeal phase dysphagia, history of C. difficile colitis,with recent admission 11/29/19 for AMS secondary to UTI, in psych ED since 1/31. Patient was admitted with altered mental status and psychotic features with history of bipolar possibly decompensation.  Psychiatry consulted. Patient was waiting to be transferred back to [place], per social worker they would take her on if there is no medical issues or if she no longer has one-on-one sitter for 24 hours. Pt continued to voice suicidal ideation intermittently and had one-on-one sitter at some point for safety.  Patient was subsequently seen by psychiatry twice and has been cleared for discharge.    Assessment/Plan:  Principal Problem:   Bipolar 1 disorder, depressed (HCC) Active Problems:   Tachycardia  Bipolar disorder with psychotic features. No longer suicidal. Has been off sitter for more than 24 hours.  Seen by psychiatry twice and have adjusted on psychotropic medication.  Patient was not at risk to herself or others.  Depakote dosing has been adjusted.   Tachycardia.  Resolved.  VQ scan was negative. 2D Echo showed EF of 65 to [id number], grade 1 diastolic dysfunction, no regional wall motion abnormality.  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  Currently on amoxicillin from 12/24/19.   CKD stage IIIb, History of renal transplant  Continue cyclosporine, CellCept.  Creatinine at baseline.  Nephrology has signed off at this time continue bicarb tablets  Essential hypertension Continue metoprolol, hold amlodipine.  Latest blood pressure of 118/70  Hypothyroidism Continue Synthroid   VTE Prophylaxis: Lovenox subcu   Code Status: Full code  Family Communication: None today.  Spoken with the patient's son yesterday   Disposition Plan:  . Patient is from: ALF . Likely disposition to ALF . Medically stable for disposition.  . Barriers to discharge:  placement pending.  Consultants:  Psych  Nephrology  Procedures:  VQ scan  Antibiotics:   Amoxicillin  Anti-infectives (From admission, onward)   Start     Dose/Rate Route Frequency Ordered Stop   12/24/19 1300  amoxicillin (AMOXIL) capsule 500 mg     500 mg Oral Every 8 hours 12/24/19 1245 01/03/20 2359   12/24/19 1200  cephALEXin (KEFLEX) capsule 250 mg  Status:  Discontinued     250 mg Oral Every 6 hours 12/24/19 0917 12/24/19 1245     Subjective: Today, patient was seen and examined at bedside.  Denies interval complaints.  Denies suicidal ideation.  No nausea vomiting abdominal pain.  Denies trouble breathing.  Objective: Vitals:   01/01/20 2007 01/02/20 0514  BP: 120/65 118/70  Pulse: 79 70  Resp:    Temp: 99.6 F (37.6 C) 98.6 F (37 C)  SpO2: 98% 98%    Intake/Output Summary (Last 24 hours) at 01/02/2020 1007 Last data filed at 01/02/2020 0553 Gross per 24 hour  Intake 420 ml  Output 500 ml  Net -80 ml   Filed Weights   12/31/19 0500 01/01/20 0500 01/01/20 0651  Weight: 79.4 kg 77.7 kg 74.1 kg   Body mass index is 25.58 kg/m.   Physical Exam:  General: Alert awake and communicative, not in obvious distress HENT: Normocephalic, pupils equally reacting to light and accommodation.  No scleral pallor or icterus noted. Oral mucosa is moist.  Chest:  Clear breath sounds. CVS: S1 &S2 heard. No murmur.  Regular rate and rhythm. Abdomen: Soft, nontender, nondistended.  Bowel sounds are heard.  Liver is not palpable, no abdominal mass palpated Extremities: No cyanosis, clubbing or edema.  Peripheral pulses are palpable. Psych: Alert, awake and oriented, CNS:  No cranial nerve deficits.  Power equal in all extremities.   Skin: Warm and dry.  No rashes noted.  Data Review: I have personally reviewed the following laboratory data and studies,  CBC: Recent Labs  Lab 12/29/19 0235 12/31/19 0404 01/02/20 0321  WBC 6.8 7.5 7.5  HGB 10.4* 11.0* 10.7*  HCT 32.1* 33.5* 32.8*  MCV 93.3 92.3 92.9  PLT 247 246 [id number]   Basic Metabolic Panel: Recent Labs  Lab 12/27/19 0245 12/29/19 0235 12/31/19 0404 01/02/20 0321  NA 139 139 140 137  K 4.1 4.2 4.3 4.6  CL 112* 110 114* 108  CO2 19* 21* 18* 21*  GLUCOSE 91 92 97 91  BUN 32* 37* 36* 40*  CREATININE 1.30* 1.36* 1.34* 1.36*  CALCIUM 9.0 9.4 9.3 9.2  MG  --  2.1  --   --    Liver Function Tests: No results for input(s): AST, ALT, ALKPHOS, BILITOT, PROT, ALBUMIN in the last 168 hours. No results for input(s): LIPASE, AMYLASE in the last 168 hours. No results for input(s): AMMONIA in the last 168 hours. Cardiac Enzymes: No results for input(s): CKTOTAL, CKMB, CKMBINDEX, TROPONINI in the last 168 hours. BNP (last 3 results) No results for input(s): BNP in the last 8760 hours.  ProBNP (last 3 results) No results for input(s): PROBNP in the last 8760 hours.  CBG: No results for input(s): GLUCAP in the last 168 hours. Recent Results (from the past 240 hour(s))  Culture, Urine     Status: Abnormal   Collection Time: 12/23/19  3:28 PM   Specimen: Urine, Clean Catch  Result Value Ref Range Status   Specimen Description URINE, CLEAN CATCH  Final   Special Requests   Final    NONE Performed at [facility] [address] [place], [place] [address]    Culture (A)  Final    60,000 COLONIES/mL MULTIPLE SPECIES PRESENT, SUGGEST RECOLLECTION   Report Status 12/25/2019 FINAL  Final  Respiratory Panel by RT PCR (Flu A&B, Covid) - Nasopharyngeal Swab     Status: None   Collection Time: 12/28/19 11:15 AM   Specimen: Nasopharyngeal Swab  Result Value Ref Range Status   SARS Coronavirus 2 by RT PCR NEGATIVE NEGATIVE Final    Comment:  (NOTE) SARS-CoV-2 target nucleic acids are NOT DETECTED. The SARS-CoV-2 RNA is generally detectable in upper respiratoy specimens during the acute phase of infection. The lowest concentration of SARS-CoV-2 viral copies this assay can detect is 131 copies/mL. A negative result does not preclude SARS-Cov-2 infection and should not be used as the sole basis for treatment or other patient management decisions. A negative result may occur with  improper specimen collection/handling, submission of specimen other than nasopharyngeal swab, presence of viral mutation(s) within the areas targeted by this assay, and inadequate number of viral copies (<131 copies/mL). A negative result must be combined with clinical observations, patient history, and epidemiological information. The expected result is Negative. Fact Sheet for Patients:  PinkCheek.be Fact Sheet for Healthcare Providers:  GravelBags.it This test is not yet ap proved or cleared by the [place] FDA and  has been authorized for detection and/or diagnosis of SARS-CoV-2 by FDA under an Emergency  Use Authorization (EUA). This EUA will remain  in effect (meaning this test can be used) for the duration of the COVID-19 declaration under Section 564(b)(1) of the Act, 21 U.S.C. section 360bbb-3(b)(1), unless the authorization is terminated or revoked sooner.    Influenza A by PCR NEGATIVE NEGATIVE Final   Influenza B by PCR NEGATIVE NEGATIVE Final    Comment: (NOTE) The Xpert Xpress SARS-CoV-2/FLU/RSV assay is intended as an aid in  the diagnosis of influenza from Nasopharyngeal swab specimens and  should not be used as a sole basis for treatment. Nasal washings and  aspirates are unacceptable for Xpert Xpress SARS-CoV-2/FLU/RSV  testing. Fact Sheet for Patients: PinkCheek.be Fact Sheet for Healthcare Providers:  GravelBags.it This test is not yet approved or cleared by the [place] FDA and  has been authorized for detection and/or diagnosis of SARS-CoV-2 by  FDA under an Emergency Use Authorization (EUA). This EUA will remain  in effect (meaning this test can be used) for the duration of the  Covid-19 declaration under Section 564(b)(1) of the Act, 21  U.S.C. section 360bbb-3(b)(1), unless the authorization is  terminated or revoked. Performed at [facility], [place] [address] [place], [place] [address]      Studies: No results found.   Flora Lipps, MD  [facility] 01/02/2020

## 2020-01-02 NOTE — TOC Progression Note (Addendum)
Transition of Care [facility]) - Progression Note    Patient Details  Name: Brenda Nolan MRN: [id number] Date of Birth: [birth year]  [facility] CM/SW Contact  Purcell Mouton, RN Phone Number: 01/02/2020, 3:17 PM  Clinical Narrative:     Pt has been without a sitter since 12/30/19 Saturday. Pt is from [place]. Spoke with Administrator Abigail Butts [phone] concerning pt's return to ALF. Pt is unable to return related to Dx suicidal ideations and a sitter.        Expected Discharge Plan and Services                                                 Social Determinants of Health (SDOH) Interventions    Readmission Risk Interventions No flowsheet data found.

## 2020-01-03 NOTE — TOC Progression Note (Signed)
Transition of Care [facility]) - Progression Note    Patient Details  Name: Brenda Nolan MRN: [id number] Date of Birth: 03-Dec-1950  Transition of Care [facility]) CM/SW Contact  Iara Monds, Marjie Skiff, RN Phone Number: 01/03/2020, 3:09 PM  Clinical Narrative:    This CM faxed additional MD notes to [facility] at 11:30am. This CM also called [facility] administrator x 2 and left messages with no return phone call. Unable to get pt returned [facility] today due to lack of communication from [place]. TOC Nolan continue to follow

## 2020-01-03 NOTE — NC FL2 (Addendum)
[place] LEVEL OF CARE SCREENING TOOL     IDENTIFICATION  Patient Name: Brenda Nolan Birthdate: 1950/11/26 Sex: female Admission Date (Current Location): 12/17/2019  [facility] and [place] Number:  Herbalist and Address:  [facility],  [place] [address], [place]      Provider Number: [id number]  Attending Physician Name and Address:  Flora Lipps, MD  Relative Name and Phone Number:  Remo Lipps, son, [phone]    Current Level of Care: Hospital Recommended Level of Care: [place] Prior Approval Number:    Date Approved/Denied:   PASRR Number:    Discharge Plan: Other (Comment)(Assisted Living)    Current Diagnoses: Patient Active Problem List   Diagnosis Date Noted  . Tachycardia 12/23/2019  . Need for transfer to another facility   . Suicidal ideation   . AMS (altered mental status) 11/30/2019  . Altered mental status 11/29/2019  . Kidney transplant status   . Stage 3b chronic kidney disease   . Essential hypertension   . Bipolar 1 disorder, depressed ([place]) 2019  . Hypothyroidism 2019    Orientation RESPIRATION BLADDER Height & Weight     Self, Time, Place  Normal Incontinent Weight: 74.5 kg Height:  5\' 7"  (170.2 cm)  BEHAVIORAL SYMPTOMS/MOOD NEUROLOGICAL BOWEL NUTRITION STATUS      Continent No added salt  AMBULATORY STATUS COMMUNICATION OF NEEDS Skin   Limited Assist Verbally Normal                       Personal Care Assistance Level of Assistance  Bathing, Dressing Bathing Assistance: Limited assistance Feeding assistance: Independent Dressing Assistance: Limited assistance     Functional Limitations Info    Sight Info: Adequate Hearing Info: Adequate Speech Info: Adequate    SPECIAL CARE FACTORS FREQUENCY  PT (By licensed PT), OT (By licensed OT)     PT Frequency: 3 x weekly OT Frequency: 3 x weekly            Contractures Contractures Info: Not present     Additional Factors Info  Code Status, Allergies Code Status Info: Full Allergies Info: Atorvastatin, Ketoconazole, Abilify, Ibuprofen, Levaquin, Sulfa antibiotics, Sulfamethoxazole     Isolation Precautions Info: VRE in urine     Current Medications (01/03/2020):  This is the current hospital active medication list Current Facility-Administered Medications  Medication Dose Route Frequency Provider Last Rate Last Admin  . acetaminophen (TYLENOL) tablet 650 mg  650 mg Oral Q6H PRN Jani [place], MD   650 mg at 12/31/19 1108   Or  . acetaminophen (TYLENOL) suppository 650 mg  650 mg Rectal Q6H PRN Jani [place]vel, MD      . amoxicillin (AMOXIL) capsule 500 mg  500 mg Oral Q8H Cristal Deer, MD   500 mg at 01/03/20 1525  . cycloSPORINE (SANDIMMUNE) capsule 125 mg  125 mg Oral q morning - 10a Couture, Cortni S, PA-C   125 mg at 01/03/20 0830  . cycloSPORINE (SANDIMMUNE) capsule 150 mg  150 mg Oral QHS Couture, Cortni S, PA-C   150 mg at 01/02/20 2113  . divalproex (DEPAKOTE) DR tablet 500 mg  500 mg Oral Q12H Pokhrel, Laxman, MD   500 mg at 01/03/20 0828  . doxazosin (CARDURA) tablet 1 mg  1 mg Oral QHS Couture, Cortni S, PA-C   1 mg at 01/02/20 2111  . enoxaparin (LOVENOX) injection 40 mg  40 mg Subcutaneous Q24H Cristal Deer, MD   40 mg  at 01/02/20 2111  . fenofibrate tablet 160 mg  160 mg Oral Daily Pokhrel, Laxman, MD   160 mg at 01/03/20 0828  . levothyroxine (SYNTHROID) tablet 50 mcg  50 mcg Oral Q0600 Couture, Cortni S, PA-C   50 mcg at 01/03/20 0515  . lip balm (CARMEX) ointment   Topical PRN Flora Lipps, MD   Given at 12/28/19 0553  . LORazepam (ATIVAN) tablet 0.5 mg  0.5 mg Oral TID Alma Friendly, MD   0.5 mg at 01/03/20 1525  . magnesium oxide (MAG-OX) tablet 400 mg  400 mg Oral Daily Couture, Cortni S, PA-C   400 mg at 01/03/20 [id number]  . megestrol (MEGACE) 400 MG/10ML suspension 400 mg  400 mg Oral BID Polly Cobia, RPH   400 mg at 01/03/20 [id number]  . metoprolol tartrate  (LOPRESSOR) tablet 100 mg  100 mg Oral BID Couture, Cortni S, PA-C   100 mg at 01/03/20 0827  . mycophenolate (CELLCEPT) capsule 250 mg  250 mg Oral BID Couture, Cortni S, PA-C   250 mg at 01/03/20 [id number]  . OLANZapine (ZYPREXA) tablet 5 mg  5 mg Oral QHS Couture, Cortni S, PA-C   5 mg at 01/02/20 2112  . sodium bicarbonate tablet 1,300 mg  1,300 mg Oral BID Justin Mend, MD   1,300 mg at 01/03/20 0827  . thiamine tablet 100 mg  100 mg Oral Daily Couture, Cortni S, PA-C   100 mg at 01/03/20 [id number]   Or  . thiamine (B-1) injection 100 mg  100 mg Intravenous Daily Couture, Cortni S, PA-C   100 mg at 12/20/19 [id number]     Discharge Medications: Please see discharge summary for a list of discharge medications.  Relevant Imaging Results:  Relevant Lab Results:   Additional Information [id number]  Orine Goga, Marjie Skiff, RN

## 2020-01-03 NOTE — Progress Notes (Signed)
PROGRESS NOTE  Brenda Nolan [id number] DOB: 1951-10-27 DOA: 12/17/2019 PCP: Patient, No Pcp Per   LOS: 11 days   Brief narrative: As per HPI,  [name],anxiety, bipolar 1 disorders, stage IIIb chronic kidney disease, unspecified difficulty walking history, oropharyngeal phase dysphagia, history of C. difficile colitis,with recent admission 11/29/19 for AMS secondary to UTI, in psych ED since 1/31. Patient was admitted with altered mental status and psychotic features with history of bipolar possibly decompensation.  Psychiatry consulted for concerns of suicidal ideation but has been cleared for discharge.   Assessment/Plan:  Principal Problem:   Bipolar 1 disorder, depressed (HCC) Active Problems:   Tachycardia  Bipolar disorder with psychotic features. No longer suicidal. Has been off sitter for several days.  Seen by psychiatry twice and have adjusted on psychotropic medication.  Patient was not at risk to herself or others.  Depakote dosing has been adjusted.   Tachycardia.  Resolved.  VQ scan was negative. 2D Echo showed EF of 65 to [id number], grade 1 diastolic dysfunction, no regional wall motion abnormality.  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  Currently on amoxicillin from 12/24/19.  Discontinue after 10-day course.  CKD stage IIIb, History of renal transplant  Continue cyclosporine, CellCept.  Creatinine at baseline.  Nephrology has signed off at this time continue bicarb tablets  Essential hypertension Continue metoprolol, hold amlodipine.  Latest blood pressure of 118/70  Hypothyroidism Continue Synthroid  VTE Prophylaxis: Lovenox subcu  Code Status: Full code  Family Communication: None   Disposition Plan:  . Patient is from: ALF . Likely disposition to ALF . Medically stable for disposition.  . Barriers to discharge:  placement pending.  Consultants:  Psych  Nephrology  Procedures:  VQ scan   Antibiotics:   Amoxicillin  Anti-infectives (From admission, onward)   Start     Dose/Rate Route Frequency Ordered Stop   12/24/19 1300  amoxicillin (AMOXIL) capsule 500 mg     500 mg Oral Every 8 hours 12/24/19 1245 01/03/20 2359   12/24/19 1200  cephALEXin (KEFLEX) capsule 250 mg  Status:  Discontinued     250 mg Oral Every 6 hours 12/24/19 0917 12/24/19 1245     Subjective: Today, patient was seen and examined at bedside.  Patient denies interval complaints.  Denies nausea vomiting shortness of breath cough fever.  Denies suicidal ideation.     Objective: Vitals:   01/03/20 0513 01/03/20 1242  BP: 123/78 114/65  Pulse: 77 73  Resp: 20 (!) 21  Temp: 98.2 F (36.8 C) 99.2 F (37.3 C)  SpO2: 98% 99%    Intake/Output Summary (Last 24 hours) at 01/03/2020 1316 Last data filed at 01/03/2020 1000 Gross per 24 hour  Intake 900 ml  Output 1650 ml  Net -750 ml   Filed Weights   01/01/20 0500 01/01/20 0651 01/03/20 0500  Weight: 77.7 kg 74.1 kg 74.5 kg   Body mass index is 25.72 kg/m.   Physical Exam:  General:  Average built, not in obvious distress, alert awake and communicative. HENT: Normocephalic, pupils equally reacting to light and accommodation.  No scleral pallor or icterus noted. Oral mucosa is moist.  Chest:  Clear breath sounds.  Diminished breath sounds bilaterally. No crackles or wheezes.  CVS: S1 &S2 heard. No murmur.  Regular rate and rhythm. Abdomen: Soft, nontender, nondistended.  Bowel sounds are heard.  Liver is not palpable, no abdominal mass palpated Extremities: No cyanosis, clubbing or edema.  Peripheral pulses are palpable. Psych:  Alert, awake and communicative. CNS:  No cranial nerve deficits.  Power equal in all extremities.  Skin: Warm and dry.  No rashes noted.   Data Review: I have personally reviewed the following laboratory data and studies,  CBC: Recent Labs  Lab 12/29/19 0235 12/31/19 0404 01/02/20 0321  WBC 6.8 7.5 7.5  HGB  10.4* 11.0* 10.7*  HCT 32.1* 33.5* 32.8*  MCV 93.3 92.3 92.9  PLT 247 246 [id number]   Basic Metabolic Panel: Recent Labs  Lab 12/29/19 0235 12/31/19 0404 01/02/20 0321  NA 139 140 137  K 4.2 4.3 4.6  CL 110 114* 108  CO2 21* 18* 21*  GLUCOSE 92 97 91  BUN 37* 36* 40*  CREATININE 1.36* 1.34* 1.36*  CALCIUM 9.4 9.3 9.2  MG 2.1  --   --    Liver Function Tests: No results for input(s): AST, ALT, ALKPHOS, BILITOT, PROT, ALBUMIN in the last 168 hours. No results for input(s): LIPASE, AMYLASE in the last 168 hours. No results for input(s): AMMONIA in the last 168 hours. Cardiac Enzymes: No results for input(s): CKTOTAL, CKMB, CKMBINDEX, TROPONINI in the last 168 hours. BNP (last 3 results) No results for input(s): BNP in the last 8760 hours.  ProBNP (last 3 results) No results for input(s): PROBNP in the last 8760 hours.  CBG: No results for input(s): GLUCAP in the last 168 hours. Recent Results (from the past 240 hour(s))  Respiratory Panel by RT PCR (Flu A&B, Covid) - Nasopharyngeal Swab     Status: None   Collection Time: 12/28/19 11:15 AM   Specimen: Nasopharyngeal Swab  Result Value Ref Range Status   SARS Coronavirus 2 by RT PCR NEGATIVE NEGATIVE Final    Comment: (NOTE) SARS-CoV-2 target nucleic acids are NOT DETECTED. The SARS-CoV-2 RNA is generally detectable in upper respiratoy specimens during the acute phase of infection. The lowest concentration of SARS-CoV-2 viral copies this assay can detect is 131 copies/mL. A negative result does not preclude SARS-Cov-2 infection and should not be used as the sole basis for treatment or other patient management decisions. A negative result may occur with  improper specimen collection/handling, submission of specimen other than nasopharyngeal swab, presence of viral mutation(s) within the areas targeted by this assay, and inadequate number of viral copies (<131 copies/mL). A negative result must be combined with clinical  observations, patient history, and epidemiological information. The expected result is Negative. Fact Sheet for Patients:  PinkCheek.be Fact Sheet for Healthcare Providers:  GravelBags.it This test is not yet ap proved or cleared by the [place] FDA and  has been authorized for detection and/or diagnosis of SARS-CoV-2 by FDA under an Emergency Use Authorization (EUA). This EUA will remain  in effect (meaning this test can be used) for the duration of the COVID-19 declaration under Section 564(b)(1) of the Act, 21 U.S.C. section 360bbb-3(b)(1), unless the authorization is terminated or revoked sooner.    Influenza A by PCR NEGATIVE NEGATIVE Final   Influenza B by PCR NEGATIVE NEGATIVE Final    Comment: (NOTE) The Xpert Xpress SARS-CoV-2/FLU/RSV assay is intended as an aid in  the diagnosis of influenza from Nasopharyngeal swab specimens and  should not be used as a sole basis for treatment. Nasal washings and  aspirates are unacceptable for Xpert Xpress SARS-CoV-2/FLU/RSV  testing. Fact Sheet for Patients: PinkCheek.be Fact Sheet for Healthcare Providers: GravelBags.it This test is not yet approved or cleared by the [place] FDA and  has been authorized for detection and/or diagnosis  of SARS-CoV-2 by  FDA under an Emergency Use Authorization (EUA). This EUA will remain  in effect (meaning this test can be used) for the duration of the  Covid-19 declaration under Section 564(b)(1) of the Act, 21  U.S.C. section 360bbb-3(b)(1), unless the authorization is  terminated or revoked. Performed at [facility], [place] [address] [place], [place] [address]      Studies: No results found.   Flora Lipps, MD  [facility] 01/03/2020

## 2020-01-04 NOTE — Progress Notes (Signed)
Occupational Therapy Treatment Patient Details Name: Brenda Nolan MRN: [id number] DOB: 1951-05-21 Today's Date: 01/04/2020    History of present illness This 69 year old female was admitted from [place], where she was receiving rehab.  She presented with tachycardia and CP.  PMH:  bipolar, anxiety, difficulty walking, cdiff, and AMS   OT comments  Pt agreeable to OOB with blankets and 3 grooming tasks from seated.  She had already bathed prior to OTs arrival  Follow Up Recommendations  SNF(if ALF, needs assist for mobility and adls and HHOT)    Equipment Recommendations  3 in 1 bedside commode    Recommendations for Other Services      Precautions / Restrictions Precautions Precautions: Fall Precaution Comments: multiple falls Restrictions Weight Bearing Restrictions: No       Mobility Bed Mobility           Sit to supine: Min assist   General bed mobility comments: light min A for trunk  Transfers   Equipment used: Rolling walker (2 wheeled)   Sit to Stand: Min assist         General transfer comment: tends to lean posteriorly when she first stands.  Assist to control descent to chair    Balance                                           ADL either performed or assessed with clinical judgement   ADL       Grooming: Minimal assistance Grooming Details (indicate cue type and reason): for teeth; set up hair and lotion         Upper Body Dressing : Minimal assistance       Toilet Transfer: Minimal assistance;Stand-pivot;RW(to chair)             General ADL Comments: got up to chair with min  A.  Performed 3 grooming activities with set up (and min A to apply tooth paste)     Vision       Perception     Praxis      Cognition Arousal/Alertness: Awake/alert Behavior During Therapy: WFL for tasks assessed/performed                                   General Comments: cues for safety; mostly  wfls        Exercises     Shoulder Instructions       General Comments      Pertinent Vitals/ Pain       Pain Assessment: No/denies pain  Home Living                                          Prior Functioning/Environment              Frequency  Min 2X/week        Progress Toward Goals  OT Goals(current goals can now be found in the care plan section)  Progress towards OT goals: Progressing toward goals     Plan      Co-evaluation                 AM-PAC OT "6 Clicks" Daily Activity     Outcome  Measure   Help from another person eating meals?: A Little Help from another person taking care of personal grooming?: A Little Help from another person toileting, which includes using toliet, bedpan, or urinal?: Total Help from another person bathing (including washing, rinsing, drying)?: A Lot Help from another person to put on and taking off regular upper body clothing?: A Little Help from another person to put on and taking off regular lower body clothing?: Total 6 Click Score: 13    End of Session    OT Visit Diagnosis: Muscle weakness (generalized) (M62.81)   Activity Tolerance     Patient Left     Nurse Communication          [id number] OT Time Calculation (min): 18 min  Charges: OT General Charges $OT Visit: 1 Visit OT Treatments $Self Care/Home Management : 8-22 mins  Roger Fasnacht S, OTR/L Acute Rehabilitation Services 01/04/2020   [place] 01/04/2020, 10:42 AM

## 2020-01-04 NOTE — Progress Notes (Signed)
Physical Therapy Treatment Patient Details Name: Brenda Nolan MRN: [id number] DOB: [birth year] Today's Date: 01/04/2020    History of Present Illness This 69 year old female was admitted from [place], where she was receiving rehab.  She presented with tachycardia and CP.  PMH:  bipolar, anxiety, difficulty walking, cdiff, and AMS    PT Comments    Pt OOB in recliner.  Pt pleasant and engaging.  Assisted with amb in hallway.   General Gait Details: pt tolerated an increased diatnce and demonstrated safe use of walker.  Pt appears at prior level and is safe to D/C back to her ALF.    Follow Up Recommendations  Home health PT(at ALF)     Equipment Recommendations  None recommended by PT    Recommendations for Other Services       Precautions / Restrictions Precautions Precautions: Fall Precaution Comments: . Restrictions Weight Bearing Restrictions: No    Mobility  Bed Mobility           Sit to supine: Min assist   General bed mobility comments: OOB in recliner  Transfers Overall transfer level: Needs assistance Equipment used: Rolling walker (2 wheeled) Transfers: Sit to/from [facility] Sit to Stand: Supervision;Min guard         General transfer comment: <25% VC's on proper hand placement and safety with turns  Ambulation/Gait Ambulation/Gait assistance: Supervision;Min guard Gait Distance (Feet): 76 Feet Assistive device: Rolling walker (2 wheeled) Gait Pattern/deviations: Step-through pattern Gait velocity: decreased   General Gait Details: pt tolerated an increased diatnce and demonstrated safe use of walker.   Stairs             Wheelchair Mobility    Modified Rankin (Stroke Patients Only)       Balance                                            Cognition Arousal/Alertness: Awake/alert Behavior During Therapy: WFL for tasks assessed/performed Overall Cognitive Status: Within Functional  Limits for tasks assessed                                 General Comments: pleasant and engaging      Exercises      General Comments        Pertinent Vitals/Pain Pain Assessment: No/denies pain    Home Living                      Prior Function            PT Goals (current goals can now be found in the care plan section) Progress towards PT goals: Progressing toward goals    Frequency    Min 2X/week      PT Plan Current plan remains appropriate    Co-evaluation              AM-PAC PT "6 Clicks" Mobility   Outcome Measure  Help needed turning from your back to your side while in a flat bed without using bedrails?: None Help needed moving from lying on your back to sitting on the side of a flat bed without using bedrails?: None Help needed moving to and from a bed to a chair (including a wheelchair)?: None Help needed standing up from a chair using your  arms (e.g., wheelchair or bedside chair)?: None Help needed to walk in hospital room?: A Little   6 Click Score: 19    End of Session Equipment Utilized During Treatment: Gait belt Activity Tolerance: Patient tolerated treatment well Patient left: with call bell/phone within reach;in chair;with chair alarm set Nurse Communication: Mobility status PT Visit Diagnosis: Difficulty in walking, not elsewhere classified (R26.2);Unsteadiness on feet (R26.81);History of falling (Z91.81)     Time: 1000-1012 PT Time Calculation (min) (ACUTE ONLY): 12 min  Charges:  $Gait Training: 8-22 mins                     Rica Koyanagi  PTA Acute  Rehabilitation Services Pager      [phone] Office      [phone]

## 2020-01-04 NOTE — Progress Notes (Signed)
PROGRESS NOTE  Brenda Nolan [id number] DOB: 08/18/1951 DOA: 12/17/2019 PCP: Patient, No Pcp Per   LOS: 12 days   Brief narrative: As per HPI,  Brenda Nolan a51 y.o.female,anxiety, bipolar 1 disorders, stage IIIb chronic kidney disease, unspecified difficulty walking history, oropharyngeal phase dysphagia, history of C. difficile colitis,with recent admission 11/29/19 for AMS secondary to UTI, in psych ED since 1/31. Patient was admitted with altered mental status and psychotic features with history of bipolar possibly decompensation.  Psychiatry consulted for concerns of suicidal ideation but has been cleared for discharge.   Assessment/Plan:  Principal Problem:   Bipolar 1 disorder, depressed (HCC) Active Problems:   Tachycardia  Bipolar disorder with psychotic features.  Patient was seen by psychiatry during hospitalization.  Currently stable on her medication regimen.  Patient is not suicidal at this time.  Has not had sitter in several days.  Continue on Depakote.    Tachycardia.  Resolved.  VQ scan was negative. 2D Echo showed EF of 65 to [id number], grade 1 diastolic dysfunction, no regional wall motion abnormality.  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  Currently on amoxicillin from 12/24/19.  Completed the course.  CKD stage IIIb, History of renal transplant  Continue cyclosporine, CellCept.  Nephrology followed the patient with hospitalization.  On Sodium bicarb tablets.  Creatinine at baseline.  Essential hypertension Continue metoprolol, hold amlodipine.  Blood pressure is controlled.  Hypothyroidism Continue Synthroid  VTE Prophylaxis: Lovenox subcu  Code Status: Full code  Family Communication: None   Disposition Plan:  . Patient is from: ALF . Likely disposition to ALF . Medically stable for disposition.  . Barriers to discharge:  placement pending.  Consultants:  Psych  Nephrology  Procedures:  VQ  scan  Antibiotics:   Amoxicillin  Anti-infectives (From admission, onward)   Start     Dose/Rate Route Frequency Ordered Stop   12/24/19 1300  amoxicillin (AMOXIL) capsule 500 mg     500 mg Oral Every 8 hours 12/24/19 1245 01/03/20 2112   12/24/19 1200  cephALEXin (KEFLEX) capsule 250 mg  Status:  Discontinued     250 mg Oral Every 6 hours 12/24/19 0917 12/24/19 1245     Subjective: Today, patient was seen and examined at bedside.  Denies any nausea, vomiting or abdominal pain.   Objective: Vitals:   01/03/20 2110 01/04/20 0523  BP: (!) 120/58 125/82  Pulse: 73 75  Resp: 20 16  Temp: 97.7 F (36.5 C) 98.4 F (36.9 C)  SpO2: 98% 99%    Intake/Output Summary (Last 24 hours) at 01/04/2020 1325 Last data filed at 01/04/2020 0059 Gross per 24 hour  Intake --  Output 1200 ml  Net -1200 ml   Filed Weights   01/01/20 0651 01/03/20 0500 01/04/20 0524  Weight: 74.1 kg 74.5 kg 73.6 kg   Body mass index is 25.41 kg/m.   Physical Exam:  General:  Average built, not in obvious distress, alert awake and communicative. HENT: Normocephalic, pupils equally reacting to light and accommodation.  No scleral pallor or icterus noted. Oral mucosa is moist.  Chest:  Clear breath sounds.  Diminished breath sounds bilaterally. No crackles or wheezes.  CVS: S1 &S2 heard. No murmur.  Regular rate and rhythm. Abdomen: Soft, nontender, nondistended.  Bowel sounds are heard. Extremities: No cyanosis, clubbing or edema.  Peripheral pulses are palpable. Psych: Alert, awake and communicative. CNS:  No cranial nerve deficits.  Power equal in all extremities.  Skin: Warm and dry.  No rashes  noted.   Data Review: I have personally reviewed the following laboratory data and studies,  CBC: Recent Labs  Lab 12/29/19 0235 12/31/19 0404 01/02/20 0321  WBC 6.8 7.5 7.5  HGB 10.4* 11.0* 10.7*  HCT 32.1* 33.5* 32.8*  MCV 93.3 92.3 92.9  PLT 247 246 [id number]   Basic Metabolic Panel: Recent Labs   Lab 12/29/19 0235 12/31/19 0404 01/02/20 0321  NA 139 140 137  K 4.2 4.3 4.6  CL 110 114* 108  CO2 21* 18* 21*  GLUCOSE 92 97 91  BUN 37* 36* 40*  CREATININE 1.36* 1.34* 1.36*  CALCIUM 9.4 9.3 9.2  MG 2.1  --   --    Liver Function Tests: No results for input(s): AST, ALT, ALKPHOS, BILITOT, PROT, ALBUMIN in the last 168 hours. No results for input(s): LIPASE, AMYLASE in the last 168 hours. No results for input(s): AMMONIA in the last 168 hours. Cardiac Enzymes: No results for input(s): CKTOTAL, CKMB, CKMBINDEX, TROPONINI in the last 168 hours. BNP (last 3 results) No results for input(s): BNP in the last 8760 hours.  ProBNP (last 3 results) No results for input(s): PROBNP in the last 8760 hours.  CBG: No results for input(s): GLUCAP in the last 168 hours. Recent Results (from the past 240 hour(s))  Respiratory Panel by RT PCR (Flu A&B, Covid) - Nasopharyngeal Swab     Status: None   Collection Time: 12/28/19 11:15 AM   Specimen: Nasopharyngeal Swab  Result Value Ref Range Status   SARS Coronavirus 2 by RT PCR NEGATIVE NEGATIVE Final    Comment: (NOTE) SARS-CoV-2 target nucleic acids are NOT DETECTED. The SARS-CoV-2 RNA is generally detectable in upper respiratoy specimens during the acute phase of infection. The lowest concentration of SARS-CoV-2 viral copies this assay can detect is 131 copies/mL. A negative result does not preclude SARS-Cov-2 infection and should not be used as the sole basis for treatment or other patient management decisions. A negative result may occur with  improper specimen collection/handling, submission of specimen other than nasopharyngeal swab, presence of viral mutation(s) within the areas targeted by this assay, and inadequate number of viral copies (<131 copies/mL). A negative result must be combined with clinical observations, patient history, and epidemiological information. The expected result is Negative. Fact Sheet for Patients:   PinkCheek.be Fact Sheet for Healthcare Providers:  GravelBags.it This test is not yet ap proved or cleared by the [place] FDA and  has been authorized for detection and/or diagnosis of SARS-CoV-2 by FDA under an Emergency Use Authorization (EUA). This EUA will remain  in effect (meaning this test can be used) for the duration of the COVID-19 declaration under Section 564(b)(1) of the Act, 21 U.S.C. section 360bbb-3(b)(1), unless the authorization is terminated or revoked sooner.    Influenza A by PCR NEGATIVE NEGATIVE Final   Influenza B by PCR NEGATIVE NEGATIVE Final    Comment: (NOTE) The Xpert Xpress SARS-CoV-2/FLU/RSV assay is intended as an aid in  the diagnosis of influenza from Nasopharyngeal swab specimens and  should not be used as a sole basis for treatment. Nasal washings and  aspirates are unacceptable for Xpert Xpress SARS-CoV-2/FLU/RSV  testing. Fact Sheet for Patients: PinkCheek.be Fact Sheet for Healthcare Providers: GravelBags.it This test is not yet approved or cleared by the [place] FDA and  has been authorized for detection and/or diagnosis of SARS-CoV-2 by  FDA under an Emergency Use Authorization (EUA). This EUA will remain  in effect (meaning this test can be used)  for the duration of the  Covid-19 declaration under Section 564(b)(1) of the Act, 21  U.S.C. section 360bbb-3(b)(1), unless the authorization is  terminated or revoked. Performed at [facility], [place] [address], [place], [place] [address]      Studies: No results found.   Flora Lipps, MD  [facility] 01/04/2020

## 2020-01-04 NOTE — TOC Progression Note (Signed)
Transition of Care [facility]) - Progression Note    Patient Details  Name: Brenda Nolan MRN: [id number] Date of Birth: July 25, 1951  Transition of [facility]) CM/SW Contact  Purcell Mouton, RN Phone Number: 01/04/2020, 2:07 PM  Clinical Narrative:    Continue to wait on return call from [facility]. Spoke with pt's son waiting for [facility] to return call for pt to return.         Expected Discharge Plan and Services                                                 Social Determinants of Health (SDOH) Interventions    Readmission Risk Interventions No flowsheet data found.

## 2020-01-05 LAB — RESPIRATORY PANEL BY RT PCR (FLU A&B, COVID)
Influenza A by PCR: NEGATIVE
Influenza B by PCR: NEGATIVE
SARS Coronavirus 2 by RT PCR: NEGATIVE

## 2020-01-05 MED ORDER — DIVALPROEX SODIUM ER 500 MG PO TB24: 500.0000 mg | ORAL_TABLET | Freq: Two times a day (BID) | ORAL | Status: DC

## 2020-01-05 MED ORDER — SODIUM BICARBONATE 650 MG PO TABS: 1300.0000 mg | ORAL_TABLET | Freq: Two times a day (BID) | ORAL | Status: DC

## 2020-01-05 MED ORDER — THIAMINE HCL 100 MG PO TABS: 100.0000 mg | ORAL_TABLET | Freq: Every day | ORAL | Status: DC

## 2020-01-05 MED ORDER — LORAZEPAM 0.5 MG PO TABS: 1.0000 mg | ORAL_TABLET | Freq: Three times a day (TID) | ORAL | 0 refills | Status: DC | PRN

## 2020-01-05 NOTE — TOC Progression Note (Signed)
Transition of Care [facility]) - Progression Note    Patient Details  Name: Brenda Nolan MRN: [id number] Date of Birth: [birth year]  Transition of Care [facility]) CM/SW Contact  Purcell Mouton, RN Phone Number: 01/05/2020, 1:58 PM  Clinical Narrative:    Transportation with [facility] called.         Expected Discharge Plan and Services           Expected Discharge Date: 01/05/20                                     Social Determinants of Health (SDOH) Interventions    Readmission Risk Interventions No flowsheet data found.

## 2020-01-05 NOTE — Progress Notes (Signed)
Report called and given to RN at [facility]. Discharge instructions and medication list reviewed. No questions at this time.

## 2020-01-05 NOTE — Progress Notes (Signed)
PROGRESS NOTE  Brenda Nolan [id number] DOB: 15-Oct-1951 DOA: 12/17/2019 PCP: Patient, No Pcp Per   LOS: 13 days   Brief narrative: As per HPI,  Brenda Nolan a65 y.o.female,anxiety, bipolar 1 disorders, stage IIIb chronic kidney disease, unspecified difficulty walking history, oropharyngeal phase dysphagia, history of C. difficile colitis,with recent admission 11/29/19 for AMS secondary to UTI, in psych ED since 1/31. Patient was admitted with altered mental status and psychotic features with history of bipolar possibly decompensation.  Psychiatry consulted for concerns of suicidal ideation but has been cleared for discharge.   Assessment/Plan:  Principal Problem:   Bipolar 1 disorder, depressed (HCC) Active Problems:   Tachycardia  Bipolar disorder with psychotic features.  Stable on current psychotropic medication.  Not suicidal.  Off sitter.  Was seen by psychiatry during hospitalization  Tachycardia.  Resolved.  VQ scan was negative. 2D Echo showed EF of 65 to [id number], grade 1 diastolic dysfunction, no regional wall motion abnormality.  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  Currently on amoxicillin from 12/24/19.  Completed the course.  CKD stage IIIb, History of renal transplant  Continue cyclosporine, CellCept.  Nephrology followed the patient with hospitalization.  On Sodium bicarb tablets.  Creatinine at baseline.  Essential hypertension Continue metoprolol, hold amlodipine.  Blood pressure is controlled.  Hypothyroidism Continue Synthroid  VTE Prophylaxis: Lovenox subcu  Code Status: Full code  Family Communication: None   Disposition Plan:  . Patient is from: ALF . Likely disposition to ALF . Medically stable for disposition.  . Barriers to discharge:  placement pending.  Will disposition  Consultants:  Psych  Nephrology  Procedures:  VQ scan  Antibiotics:   Amoxicillin 2/7>2/17  Anti-infectives (From admission, onward)    Start     Dose/Rate Route Frequency Ordered Stop   12/24/19 1300  amoxicillin (AMOXIL) capsule 500 mg     500 mg Oral Every 8 hours 12/24/19 1245 01/03/20 2112   12/24/19 1200  cephALEXin (KEFLEX) capsule 250 mg  Status:  Discontinued     250 mg Oral Every 6 hours 12/24/19 0917 12/24/19 1245     Subjective: The, patient was seen and examined at bedside.  Denies any nausea, vomiting or abdominal pain.  No fever chills.  Objective: Vitals:   01/04/20 2057 01/05/20 0530  BP: (!) 163/83 (!) 147/74  Pulse: 66 71  Resp: 19 18  Temp: 98.5 F (36.9 C) (!) 97.5 F (36.4 C)  SpO2: 99% 97%    Intake/Output Summary (Last 24 hours) at 01/05/2020 1037 Last data filed at 01/04/2020 2141 Gross per 24 hour  Intake 240 ml  Output 860 ml  Net -620 ml   Filed Weights   01/03/20 0500 01/04/20 0524 01/05/20 0530  Weight: 74.5 kg 73.6 kg 74.4 kg   Body mass index is 25.69 kg/m.   Physical Exam: General:  Average built, not in obvious distress, alert awake and communicative. HENT: Normocephalic, pupils equally reacting to light and accommodation.  No scleral pallor or icterus noted. Oral mucosa is moist.  Chest:  Clear breath sounds.  No wheezes or crackles.   CVS: S1 &S2 heard. No murmur.  Regular rate and rhythm. Abdomen: Soft, nontender, nondistended.  Bowel sounds are heard. Extremities: No cyanosis, clubbing or edema.  Peripheral pulses are palpable. Psych: Alert, awake and communicative. CNS:  No cranial nerve deficits.  Power equal in all extremities.  Skin: Warm and dry.  No rashes noted.   Data Review: I have personally reviewed the following laboratory  data and studies,  CBC: Recent Labs  Lab 12/31/19 0404 01/02/20 0321  WBC 7.5 7.5  HGB 11.0* 10.7*  HCT 33.5* 32.8*  MCV 92.3 92.9  PLT 246 [id number]   Basic Metabolic Panel: Recent Labs  Lab 12/31/19 0404 01/02/20 0321  NA 140 137  K 4.3 4.6  CL 114* 108  CO2 18* 21*  GLUCOSE 97 91  BUN 36* 40*  CREATININE 1.34*  1.36*  CALCIUM 9.3 9.2   Liver Function Tests: No results for input(s): AST, ALT, ALKPHOS, BILITOT, PROT, ALBUMIN in the last 168 hours. No results for input(s): LIPASE, AMYLASE in the last 168 hours. No results for input(s): AMMONIA in the last 168 hours. Cardiac Enzymes: No results for input(s): CKTOTAL, CKMB, CKMBINDEX, TROPONINI in the last 168 hours. BNP (last 3 results) No results for input(s): BNP in the last 8760 hours.  ProBNP (last 3 results) No results for input(s): PROBNP in the last 8760 hours.  CBG: No results for input(s): GLUCAP in the last 168 hours. Recent Results (from the past 240 hour(s))  Respiratory Panel by RT PCR (Flu A&B, Covid) - Nasopharyngeal Swab     Status: None   Collection Time: 12/28/19 11:15 AM   Specimen: Nasopharyngeal Swab  Result Value Ref Range Status   SARS Coronavirus 2 by RT PCR NEGATIVE NEGATIVE Final    Comment: (NOTE) SARS-CoV-2 target nucleic acids are NOT DETECTED. The SARS-CoV-2 RNA is generally detectable in upper respiratoy specimens during the acute phase of infection. The lowest concentration of SARS-CoV-2 viral copies this assay can detect is 131 copies/mL. A negative result does not preclude SARS-Cov-2 infection and should not be used as the sole basis for treatment or other patient management decisions. A negative result may occur with  improper specimen collection/handling, submission of specimen other than nasopharyngeal swab, presence of viral mutation(s) within the areas targeted by this assay, and inadequate number of viral copies (<131 copies/mL). A negative result must be combined with clinical observations, patient history, and epidemiological information. The expected result is Negative. Fact Sheet for Patients:  PinkCheek.be Fact Sheet for Healthcare Providers:  GravelBags.it This test is not yet ap proved or cleared by the [place] FDA and  has  been authorized for detection and/or diagnosis of SARS-CoV-2 by FDA under an Emergency Use Authorization (EUA). This EUA will remain  in effect (meaning this test can be used) for the duration of the COVID-19 declaration under Section 564(b)(1) of the Act, 21 U.S.C. section 360bbb-3(b)(1), unless the authorization is terminated or revoked sooner.    Influenza A by PCR NEGATIVE NEGATIVE Final   Influenza B by PCR NEGATIVE NEGATIVE Final    Comment: (NOTE) The Xpert Xpress SARS-CoV-2/FLU/RSV assay is intended as an aid in  the diagnosis of influenza from Nasopharyngeal swab specimens and  should not be used as a sole basis for treatment. Nasal washings and  aspirates are unacceptable for Xpert Xpress SARS-CoV-2/FLU/RSV  testing. Fact Sheet for Patients: PinkCheek.be Fact Sheet for Healthcare Providers: GravelBags.it This test is not yet approved or cleared by the [place] FDA and  has been authorized for detection and/or diagnosis of SARS-CoV-2 by  FDA under an Emergency Use Authorization (EUA). This EUA will remain  in effect (meaning this test can be used) for the duration of the  Covid-19 declaration under Section 564(b)(1) of the Act, 21  U.S.C. section 360bbb-3(b)(1), unless the authorization is  terminated or revoked. Performed at [facility], [address] [place],  [place] [address]      Studies: No results found.   Flora Lipps, MD  [facility] 01/05/2020

## 2020-01-05 NOTE — Discharge Summary (Addendum)
Physician Discharge Summary  Brenda Nolan [id number] DOB: 09-20-1951 DOA: 12/17/2019  PCP: Patient, No Pcp Per  Admit date: 12/17/2019 Discharge date: 01/05/2020  Admitted From: Home  Discharge disposition: Assisted living facility   Recommendations for Outpatient Follow-Up:   . Follow up with your primary care provider in one week.  . Check CBC, BMP at that time  Discharge Diagnosis:   Principal Problem:   Bipolar 1 disorder, depressed ([place]) Active Problems:   Tachycardia  Discharge Condition: Improved.  Diet recommendation: No added salt  Wound care: None.  Code status: Full.   History of Present Illness:   Brenda Nolan y.o.female,anxiety, bipolar 1 disorders, stage IIIb chronic kidney disease, unspecified difficulty walking history, oropharyngeal phase dysphagia, history of C. difficile colitis,with recent admission 11/29/19 for AMS secondary to UTI, in psych ED since 1/31. Patient was admitted with altered mental status and psychotic features with history of bipolar possibly decompensation. Psychiatry consulted for concerns of suicidal ideation and bipolar flare and was seen during hospitalization.  Medications were optimized and patient was cleared for discharge.   Hospital Course:   Following conditions were addressed during hospitalization as listed below,  Bipolar disorder with psychotic features.  Stable on current psychotropic medication.  Not suicidal.  Off sitter for several days.  Was seen by psychiatry during hospitalization and medications were adjusted.  Tachycardia.    On presentation.  Resolved.  VQ scan was negative. 2D Echo showed EF of 65 to [id number], grade 1 diastolic dysfunction, no regional wall motion abnormality.  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    Received amoxicillin from 12/24/19.  Completed the course.  CKD stage IIIb, History of renal transplant  Continue cyclosporine, CellCept.  Nephrology followed  the patient with hospitalization.  On Sodium bicarb tablets.  Creatinine at baseline.  We will continue sodium bicarb tablet as outpatient.  Follow-up with nephrology as outpatient.  Essential hypertension Continue metoprolol, hold amlodipine.  Blood pressure is controlled.  Hypothyroidism Continue Synthroid  Disposition.  At this time, patient is stable for disposition to assisted living facility.  Spoke with the patient's son Mr. Brenda Nolan on the phone and updated him about the plan for disposition to assisted living facility.  Medical Consultants:    Psychiatry  Nephrology  Procedures:    VQ scan Subjective:   Today, patient denies interval complaints.  Denies fever, chills, nausea, vomiting, shortness of breath.  Discharge Exam:   Vitals:   01/04/20 2057 01/05/20 0530  BP: (!) 163/83 (!) 147/74  Pulse: 66 71  Resp: 19 18  Temp: 98.5 F (36.9 C) (!) 97.5 F (36.4 C)  SpO2: 99% 97%   Vitals:   01/04/20 0524 01/04/20 1420 01/04/20 2057 01/05/20 0530  BP:  126/72 (!) 163/83 (!) 147/74  Pulse:  73 66 71  Resp:  (!) 22 19 18   Temp:  98.9 F (37.2 C) 98.5 F (36.9 C) (!) 97.5 F (36.4 C)  TempSrc:  Oral  Oral  SpO2:  100% 99% 97%  Weight: 73.6 kg   74.4 kg  Height:       General: Alert awake, not in obvious distress, communicative. HENT: pupils equally reacting to light,  No scleral pallor or icterus noted. Oral mucosa is moist.  Chest:  Clear breath sounds.  Diminished breath sounds bilaterally. No crackles or wheezes.  CVS: S1 &S2 heard. No murmur.  Regular rate and rhythm. Abdomen: Soft, nontender, nondistended.  Bowel sounds are heard.   Extremities: No cyanosis, clubbing  or edema.  Peripheral pulses are palpable. Psych: Alert, awake and communicative.  Stable mood CNS:  No cranial nerve deficits.  Power equal in all extremities.   Skin: Warm and dry.  No rashes noted.  The results of significant diagnostics from this hospitalization (including imaging,  microbiology, ancillary and laboratory) are listed below for reference.     Diagnostic Studies:   DG Chest 2 View  Result Date: 12/18/2019 CLINICAL DATA:  Altered mental status. EXAM: CHEST - 2 VIEW COMPARISON:  Chest x-ray dated 12/17/2019 and 01/31/2018 FINDINGS: There is minimal linear atelectasis at the left lung base in the right perihilar region. Slight peribronchial thickening at the inferior aspect of the right hilum. The lungs are otherwise clear. Heart size and vascularity are normal. No effusions. No bone abnormalities. IMPRESSION: Possible slight bronchitic changes. Electronically Signed   By: Lorriane Shire M.D.   On: 12/18/2019 11:01   CT Head Wo Contrast  Result Date: 12/17/2019 CLINICAL DATA:  Altered mental status. EXAM: CT HEAD WITHOUT CONTRAST TECHNIQUE: Contiguous axial images were obtained from the base of the skull through the vertex without intravenous contrast. COMPARISON:  November 30, 2019 FINDINGS: Brain: There is mild cerebral atrophy with widening of the extra-axial spaces and ventricular dilatation. There are areas of decreased attenuation within the white matter tracts of the supratentorial brain, consistent with microvascular disease changes. Vascular: No hyperdense vessel or unexpected calcification. Skull: Normal. Negative for fracture or focal lesion. Sinuses/Orbits: No acute finding. Other: None. IMPRESSION: No acute intracranial pathology. Electronically Signed   By: Virgina Norfolk M.D.   On: 12/17/2019 18:49   DG Chest Portable 1 View  Result Date: 12/17/2019 CLINICAL DATA:  Cough. EXAM: PORTABLE CHEST 1 VIEW COMPARISON:  November 30, 2019 FINDINGS: The heart size and mediastinal contours are within normal limits. Both lungs are clear. The visualized skeletal structures are unremarkable. IMPRESSION: No active disease. Electronically Signed   By: Dorise Bullion III M.D   On: 12/17/2019 18:30     Labs:   Basic Metabolic Panel: Recent Labs  Lab  12/31/19 0404 01/02/20 0321  NA 140 137  K 4.3 4.6  CL 114* 108  CO2 18* 21*  GLUCOSE 97 91  BUN 36* 40*  CREATININE 1.34* 1.36*  CALCIUM 9.3 9.2   GFR Estimated Creatinine Clearance: 42.3 mL/min (A) (by C-G formula based on SCr of 1.36 mg/dL (H)). Liver Function Tests: No results for input(s): AST, ALT, ALKPHOS, BILITOT, PROT, ALBUMIN in the last 168 hours. No results for input(s): LIPASE, AMYLASE in the last 168 hours. No results for input(s): AMMONIA in the last 168 hours. Coagulation profile No results for input(s): INR, PROTIME in the last 168 hours.  CBC: Recent Labs  Lab 12/31/19 0404 01/02/20 0321  WBC 7.5 7.5  HGB 11.0* 10.7*  HCT 33.5* 32.8*  MCV 92.3 92.9  PLT 246 224   Cardiac Enzymes: No results for input(s): CKTOTAL, CKMB, CKMBINDEX, TROPONINI in the last 168 hours. BNP: Invalid input(s): POCBNP CBG: No results for input(s): GLUCAP in the last 168 hours. D-Dimer No results for input(s): DDIMER in the last 72 hours. Hgb A1c No results for input(s): HGBA1C in the last 72 hours. Lipid Profile No results for input(s): CHOL, HDL, LDLCALC, TRIG, CHOLHDL, LDLDIRECT in the last 72 hours. Thyroid function studies No results for input(s): TSH, T4TOTAL, T3FREE, THYROIDAB in the last 72 hours.  Invalid input(s): FREET3 Anemia work up No results for input(s): VITAMINB12, FOLATE, FERRITIN, TIBC, IRON, RETICCTPCT in the last  72 hours. Microbiology Recent Results (from the past 240 hour(s))  Respiratory Panel by RT PCR (Flu A&B, Covid) - Nasopharyngeal Swab     Status: None   Collection Time: 12/28/19 11:15 AM   Specimen: Nasopharyngeal Swab  Result Value Ref Range Status   SARS Coronavirus 2 by RT PCR NEGATIVE NEGATIVE Final    Comment: (NOTE) SARS-CoV-2 target nucleic acids are NOT DETECTED. The SARS-CoV-2 RNA is generally detectable in upper respiratoy specimens during the acute phase of infection. The lowest concentration of SARS-CoV-2 viral copies this  assay can detect is 131 copies/mL. A negative result does not preclude SARS-Cov-2 infection and should not be used as the sole basis for treatment or other patient management decisions. A negative result may occur with  improper specimen collection/handling, submission of specimen other than nasopharyngeal swab, presence of viral mutation(s) within the areas targeted by this assay, and inadequate number of viral copies (<131 copies/mL). A negative result must be combined with clinical observations, patient history, and epidemiological information. The expected result is Negative. Fact Sheet for Patients:  PinkCheek.be Fact Sheet for Healthcare Providers:  GravelBags.it This test is not yet ap proved or cleared by the [place] FDA and  has been authorized for detection and/or diagnosis of SARS-CoV-2 by FDA under an Emergency Use Authorization (EUA). This EUA will remain  in effect (meaning this test can be used) for the duration of the COVID-19 declaration under Section 564(b)(1) of the Act, 21 U.S.C. section 360bbb-3(b)(1), unless the authorization is terminated or revoked sooner.    Influenza A by PCR NEGATIVE NEGATIVE Final   Influenza B by PCR NEGATIVE NEGATIVE Final    Comment: (NOTE) The Xpert Xpress SARS-CoV-2/FLU/RSV assay is intended as an aid in  the diagnosis of influenza from Nasopharyngeal swab specimens and  should not be used as a sole basis for treatment. Nasal washings and  aspirates are unacceptable for Xpert Xpress SARS-CoV-2/FLU/RSV  testing. Fact Sheet for Patients: PinkCheek.be Fact Sheet for Healthcare Providers: GravelBags.it This test is not yet approved or cleared by the [place] FDA and  has been authorized for detection and/or diagnosis of SARS-CoV-2 by  FDA under an Emergency Use Authorization (EUA). This EUA will remain  in  effect (meaning this test can be used) for the duration of the  Covid-19 declaration under Section 564(b)(1) of the Act, 21  U.S.C. section 360bbb-3(b)(1), unless the authorization is  terminated or revoked. Performed at [facility], [place] [address] [place], [place] [address]      Discharge Instructions:   Discharge Instructions    Diet - low sodium heart healthy   Complete by: As directed    Discharge instructions   Complete by: As directed    Follow-up with your primary care physician in 1 to 2 weeks.  Continue medications as prescribed   Increase activity slowly   Complete by: As directed      Allergies as of 01/05/2020      Reactions   Atorvastatin Other (See Comments)   Myalgias   Ketoconazole Rash   Abilify [aripiprazole] Other (See Comments)   Unknown   Ibuprofen Other (See Comments)   Unknown   Levaquin [levofloxacin] Other (See Comments)   Unknown   Sulfa Antibiotics Other (See Comments)   Unknown   Sulfamethoxazole Other (See Comments)   Unknown      Medication List    STOP taking these medications   clonazePAM 1 MG tablet Commonly known as: Bobbye [place]  TAKE these medications   amLODipine 5 MG tablet Commonly known as: NORVASC Take 5 mg by mouth daily.   cycloSPORINE 25 MG capsule Commonly known as: SANDIMMUNE Take 125 mg by mouth daily.   cycloSPORINE 25 MG capsule Commonly known as: SANDIMMUNE Take 150 mg by mouth at bedtime.   divalproex 500 MG 24 hr tablet Commonly known as: DEPAKOTE ER Take 1 tablet (500 mg total) by mouth every 12 (twelve) hours. What changed: when to take this   doxazosin 1 MG tablet Commonly known as: CARDURA Take 1 mg by mouth at bedtime.   fenofibrate 160 MG tablet Take 160 mg by mouth daily.   levothyroxine 50 MCG tablet Commonly known as: SYNTHROID Take 50 mcg by mouth daily before breakfast.   LORazepam 0.5 MG tablet Commonly known as: Ativan Take 2 tablets (1 mg total) by mouth  every 8 (eight) hours as needed for anxiety. What changed:   medication strength  when to take this   Magnesium 400 MG Tabs Take 1 tablet by mouth daily.   megestrol 40 MG/ML suspension Commonly known as: MEGACE Take 400 mg by mouth 2 (two) times daily.   metoprolol tartrate 100 MG tablet Commonly known as: LOPRESSOR Take 100 mg by mouth 2 (two) times daily.   multivitamin tablet Take 1 tablet by mouth daily.   mycophenolate 250 MG capsule Commonly known as: CELLCEPT Take 250 mg by mouth 2 (two) times daily.   OLANZapine 5 MG tablet Commonly known as: ZYPREXA Take 1 tablet (5 mg total) by mouth at bedtime. What changed: Another medication with the same name was removed. Continue taking this medication, and follow the directions you see here.   sodium bicarbonate 650 MG tablet Take 2 tablets (1,300 mg total) by mouth 2 (two) times daily.   thiamine 100 MG tablet Take 1 tablet (100 mg total) by mouth daily. Start taking on: January 06, 2020       Time coordinating discharge: 39 minutes  Signed:  Mackinley Kiehn  [facility] 01/05/2020, 11:18 AM

## 2020-03-16 DEATH — deceased

## 2020-04-01 IMAGING — CT CT HEAD W/O CM
3 series · 14 of 47 positions shown, 16 images · non-contrast
Comparison: November 30, 2019

CLINICAL DATA: Altered mental status.

EXAM:
CT HEAD WITHOUT CONTRAST
TECHNIQUE: Contiguous axial images were obtained from the base of the skull
through the vertex without intravenous contrast.

[Series 2: head wo · axial · 0.47mm/px · z∈[-144,-14]mm · 8 of 32 slices shown, 10 images]
[im 3/32  brain]
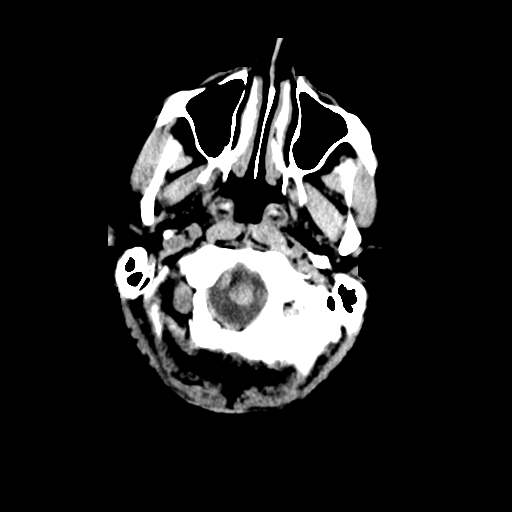
[im 3/32  bone]
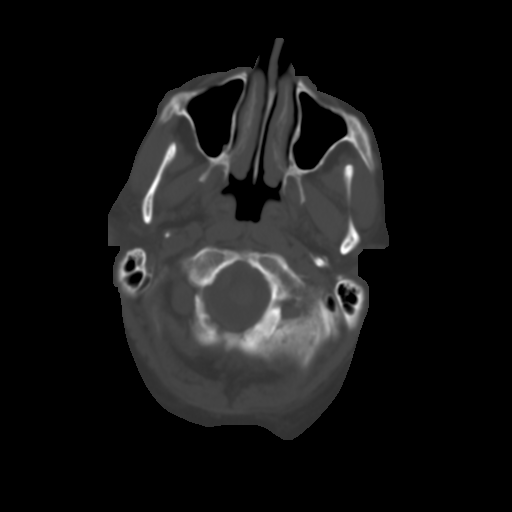
[im 7/32  brain]
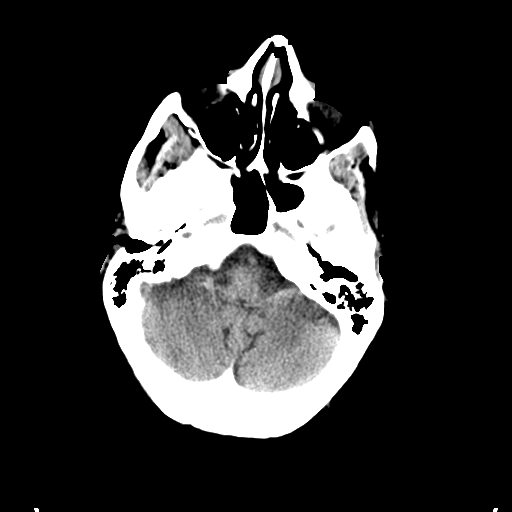
[im 10/32  brain]
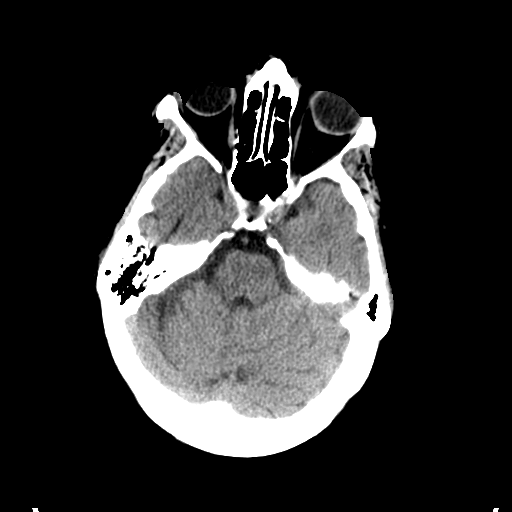
[im 14/32  brain]
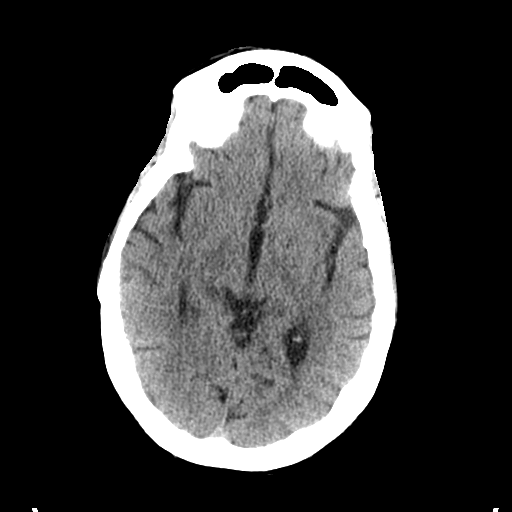
[im 18/32  brain]
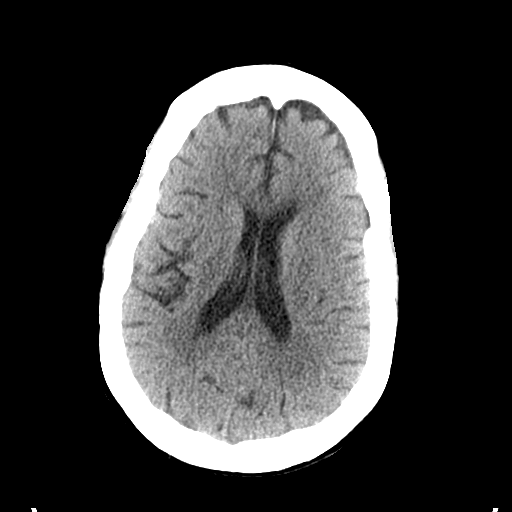
[im 18/32  bone]
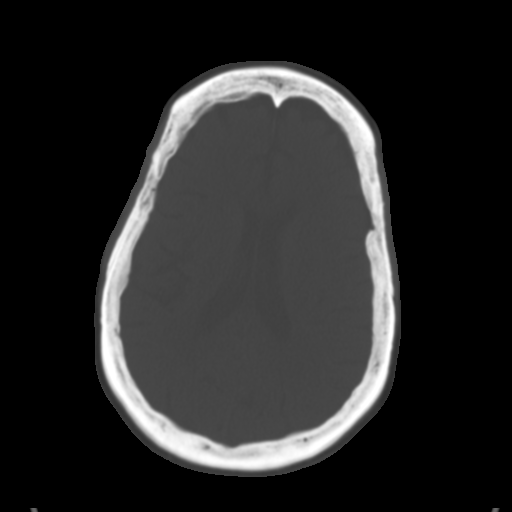
[im 22/32  brain]
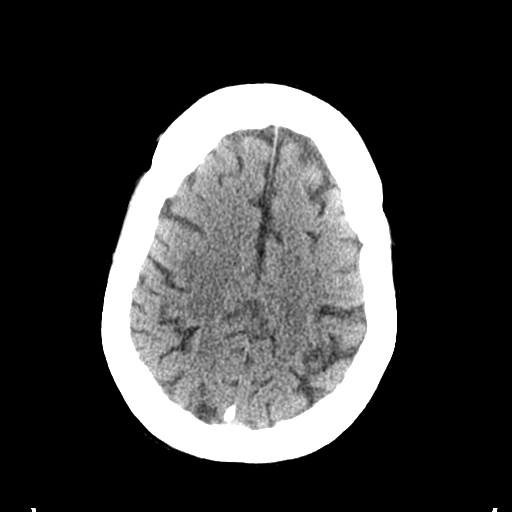
[im 25/32  brain]
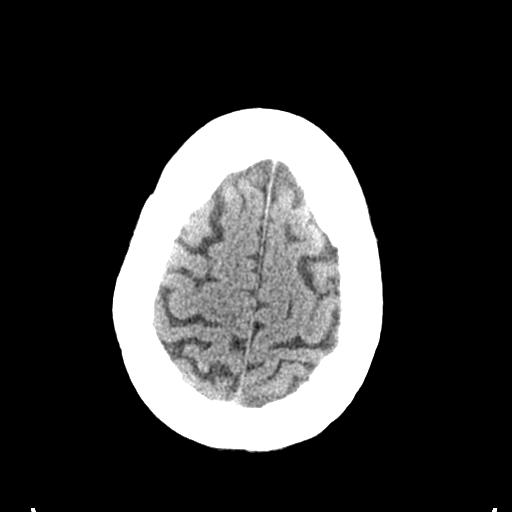
[im 29/32  brain]
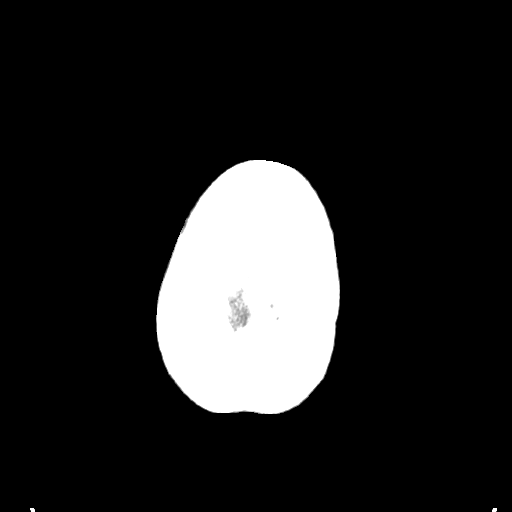

[Series 4: coronal soft tissue · coronal · 0.33mm/px · 3 of 66 slices shown]
[im 22/66  brain]
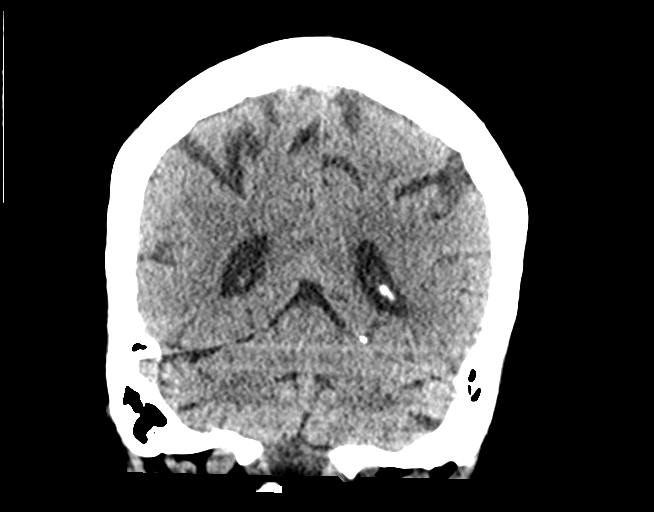
[im 29/66  brain]
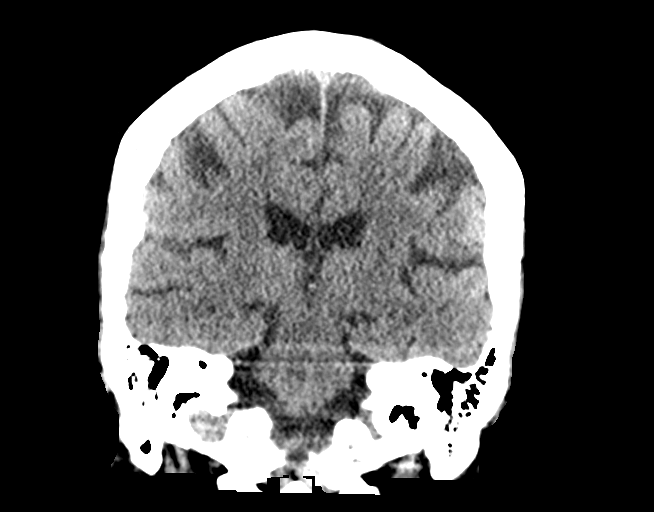
[im 37/66  brain]
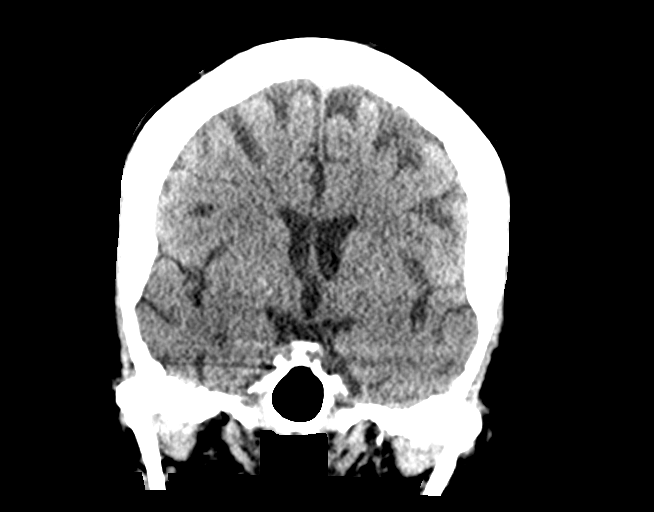

[Series 5: sagittal soft tissue · sagittal · 0.33mm/px · 3 of 48 slices shown]
[im 16/48  brain]
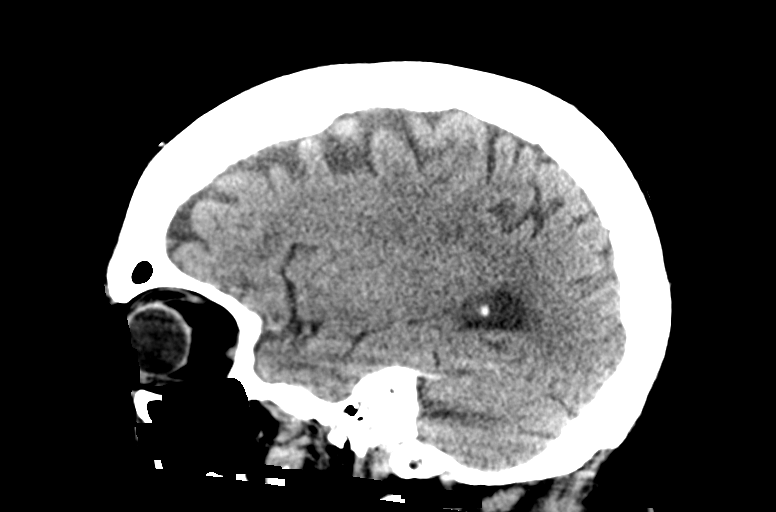
[im 24/48  brain]
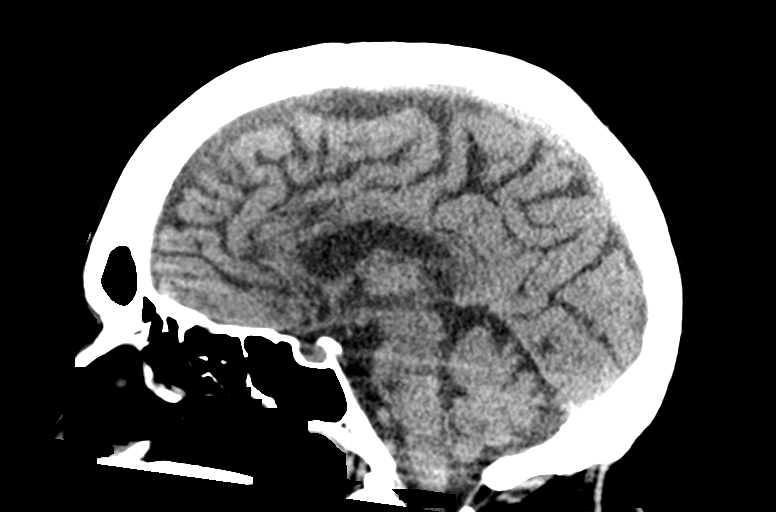
[im 32/48  brain]
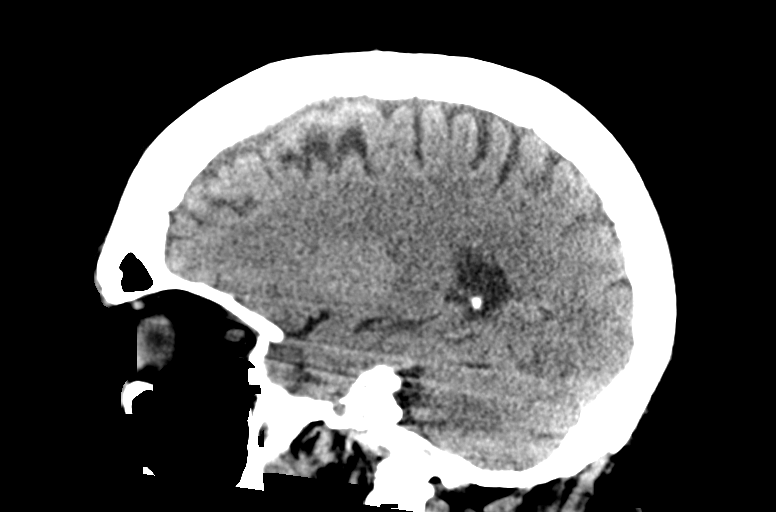

[14 of 47 positions shown; findings below may reference images not displayed]

FINDINGS: Brain: There is mild cerebral atrophy with widening of the
extra-axial spaces and ventricular dilatation.
There are areas of decreased attenuation within the white matter
tracts of the supratentorial brain, consistent with microvascular
disease changes.

Vascular: No hyperdense vessel or unexpected calcification.

Skull: Normal. Negative for fracture or focal lesion.

Sinuses/Orbits: No acute finding.

Other: None.
IMPRESSION: No acute intracranial pathology.
